# Patient Record
Sex: Female | Born: 1950 | Race: White | Hispanic: No | Marital: Married | Smoking: Current every day smoker
Health system: Southern US, Community
[De-identification: ages and names within clinical notes are randomized; demographics above are authoritative.]

## PROBLEM LIST (undated history)

## (undated) DIAGNOSIS — F32A Depression, unspecified: Secondary | ICD-10-CM

## (undated) DIAGNOSIS — E079 Disorder of thyroid, unspecified: Secondary | ICD-10-CM

## (undated) DIAGNOSIS — I10 Essential (primary) hypertension: Secondary | ICD-10-CM

## (undated) DIAGNOSIS — M1990 Unspecified osteoarthritis, unspecified site: Secondary | ICD-10-CM

## (undated) DIAGNOSIS — N840 Polyp of corpus uteri: Secondary | ICD-10-CM

## (undated) DIAGNOSIS — M8580 Other specified disorders of bone density and structure, unspecified site: Secondary | ICD-10-CM

## (undated) DIAGNOSIS — T7840XA Allergy, unspecified, initial encounter: Secondary | ICD-10-CM

## (undated) HISTORY — PX: TUBAL LIGATION: SHX77

## (undated) HISTORY — PX: TOE SURGERY: SHX1073

## (undated) HISTORY — PX: OTHER SURGICAL HISTORY: SHX169

## (undated) HISTORY — PX: HERNIA REPAIR: SHX51

## (undated) HISTORY — DX: Depression, unspecified: F32.A

## (undated) HISTORY — DX: Unspecified osteoarthritis, unspecified site: M19.90

## (undated) HISTORY — DX: Disorder of thyroid, unspecified: E07.9

## (undated) HISTORY — DX: Essential (primary) hypertension: I10

## (undated) HISTORY — DX: Other specified disorders of bone density and structure, unspecified site: M85.80

## (undated) HISTORY — PX: APPENDECTOMY: SHX54

## (undated) HISTORY — DX: Polyp of corpus uteri: N84.0

## (undated) HISTORY — PX: CARPAL TUNNEL RELEASE: SHX101

## (undated) HISTORY — DX: Allergy, unspecified, initial encounter: T78.40XA

---

## 2000-03-27 ENCOUNTER — Encounter: Admission: RE | Admit: 2000-03-27 | Discharge: 2000-03-27 | Payer: Self-pay | Admitting: Emergency Medicine

## 2000-03-27 ENCOUNTER — Encounter: Payer: Self-pay | Admitting: Emergency Medicine

## 2000-04-09 ENCOUNTER — Ambulatory Visit (HOSPITAL_COMMUNITY): Admission: RE | Admit: 2000-04-09 | Discharge: 2000-04-09 | Payer: Self-pay | Admitting: Urology

## 2000-04-09 ENCOUNTER — Encounter (INDEPENDENT_AMBULATORY_CARE_PROVIDER_SITE_OTHER): Payer: Self-pay | Admitting: Specialist

## 2000-04-29 ENCOUNTER — Ambulatory Visit (HOSPITAL_COMMUNITY): Admission: RE | Admit: 2000-04-29 | Discharge: 2000-04-29 | Payer: Self-pay | Admitting: Urology

## 2000-04-29 ENCOUNTER — Encounter (INDEPENDENT_AMBULATORY_CARE_PROVIDER_SITE_OTHER): Payer: Self-pay | Admitting: Specialist

## 2000-11-10 ENCOUNTER — Other Ambulatory Visit: Admission: RE | Admit: 2000-11-10 | Discharge: 2000-11-10 | Payer: Self-pay | Admitting: Urology

## 2001-03-08 ENCOUNTER — Encounter: Payer: Self-pay | Admitting: Emergency Medicine

## 2001-03-08 ENCOUNTER — Encounter: Admission: RE | Admit: 2001-03-08 | Discharge: 2001-03-08 | Payer: Self-pay | Admitting: Emergency Medicine

## 2001-07-09 ENCOUNTER — Other Ambulatory Visit: Admission: RE | Admit: 2001-07-09 | Discharge: 2001-07-09 | Payer: Self-pay | Admitting: Obstetrics and Gynecology

## 2002-09-06 ENCOUNTER — Encounter: Admission: RE | Admit: 2002-09-06 | Discharge: 2002-09-06 | Payer: Self-pay | Admitting: Emergency Medicine

## 2002-09-06 ENCOUNTER — Encounter: Payer: Self-pay | Admitting: Emergency Medicine

## 2002-10-20 ENCOUNTER — Encounter: Payer: Self-pay | Admitting: Emergency Medicine

## 2002-10-20 ENCOUNTER — Encounter: Admission: RE | Admit: 2002-10-20 | Discharge: 2002-10-20 | Payer: Self-pay | Admitting: Emergency Medicine

## 2003-10-05 ENCOUNTER — Other Ambulatory Visit: Admission: RE | Admit: 2003-10-05 | Discharge: 2003-10-05 | Payer: Self-pay | Admitting: Obstetrics and Gynecology

## 2004-10-15 ENCOUNTER — Other Ambulatory Visit: Admission: RE | Admit: 2004-10-15 | Discharge: 2004-10-15 | Payer: Self-pay | Admitting: Obstetrics and Gynecology

## 2004-11-05 ENCOUNTER — Ambulatory Visit (HOSPITAL_BASED_OUTPATIENT_CLINIC_OR_DEPARTMENT_OTHER): Admission: RE | Admit: 2004-11-05 | Discharge: 2004-11-05 | Payer: Self-pay | Admitting: Obstetrics and Gynecology

## 2004-11-05 ENCOUNTER — Ambulatory Visit (HOSPITAL_COMMUNITY): Admission: RE | Admit: 2004-11-05 | Discharge: 2004-11-05 | Payer: Self-pay | Admitting: Obstetrics and Gynecology

## 2004-11-05 ENCOUNTER — Encounter (INDEPENDENT_AMBULATORY_CARE_PROVIDER_SITE_OTHER): Payer: Self-pay | Admitting: *Deleted

## 2005-01-30 ENCOUNTER — Ambulatory Visit (HOSPITAL_COMMUNITY): Admission: RE | Admit: 2005-01-30 | Discharge: 2005-01-30 | Payer: Self-pay | Admitting: Orthopedic Surgery

## 2005-01-30 ENCOUNTER — Ambulatory Visit (HOSPITAL_BASED_OUTPATIENT_CLINIC_OR_DEPARTMENT_OTHER): Admission: RE | Admit: 2005-01-30 | Discharge: 2005-01-30 | Payer: Self-pay | Admitting: Orthopedic Surgery

## 2007-01-01 ENCOUNTER — Encounter: Admission: RE | Admit: 2007-01-01 | Discharge: 2007-01-01 | Payer: Self-pay | Admitting: Emergency Medicine

## 2008-03-02 ENCOUNTER — Other Ambulatory Visit: Admission: RE | Admit: 2008-03-02 | Discharge: 2008-03-02 | Payer: Self-pay | Admitting: Obstetrics and Gynecology

## 2008-04-13 ENCOUNTER — Encounter: Admission: RE | Admit: 2008-04-13 | Discharge: 2008-04-13 | Payer: Self-pay | Admitting: Emergency Medicine

## 2008-06-12 ENCOUNTER — Ambulatory Visit: Payer: Self-pay | Admitting: Obstetrics and Gynecology

## 2009-05-10 ENCOUNTER — Encounter: Payer: Self-pay | Admitting: Obstetrics and Gynecology

## 2009-05-10 ENCOUNTER — Other Ambulatory Visit: Admission: RE | Admit: 2009-05-10 | Discharge: 2009-05-10 | Payer: Self-pay | Admitting: Obstetrics and Gynecology

## 2009-05-10 ENCOUNTER — Ambulatory Visit: Payer: Self-pay | Admitting: Obstetrics and Gynecology

## 2010-05-14 ENCOUNTER — Ambulatory Visit: Payer: Self-pay | Admitting: Obstetrics and Gynecology

## 2010-05-14 ENCOUNTER — Other Ambulatory Visit: Admission: RE | Admit: 2010-05-14 | Discharge: 2010-05-14 | Payer: Self-pay | Admitting: Obstetrics and Gynecology

## 2010-07-04 ENCOUNTER — Ambulatory Visit: Payer: Self-pay | Admitting: Obstetrics and Gynecology

## 2011-01-24 NOTE — Op Note (Signed)
Sylvia Stevens, Sylvia Stevens                 ACCOUNT NO.:  [id number]   MEDICAL RECORD NO.:  [id number]          PATIENT TYPE:  AMB   LOCATION:  DSC                          FACILITY:  [facility]   PHYSICIAN:  Rodney A. Mortenson, M.D.DATE OF BIRTH:  07/02/1951   DATE OF PROCEDURE:  01/30/2005  DATE OF DISCHARGE:                                 OPERATIVE REPORT   PREOPERATIVE DIAGNOSIS:  Osteoarthritis, right great toe.   POSTOPERATIVE DIAGNOSIS:  Osteoarthritis, right great toe.   OPERATION PERFORMED:  Lorenz Coaster resection proximal portion of proximal phalanx,  right great toe and debridement of osteophytes, right great toe  metacarpophalangeal joint.   SURGEON:  Lenard Galloway. Chaney Malling, M.D.   ANESTHESIA:  MAC.   DESCRIPTION OF PROCEDURE:  Patient placed on the operating table in supine  position.  The right lower extremity was prepped with DuraPrep and draped  out in the usual manner.  The patient had a popliteal block and local  anesthesia was placed in the skin around the great toe.  The foot was then  wrapped out with an Esmarch up into the midcalf area.  A dorsal incision  made over the MP joint carried proximally and distally.  Skin edges were  retracted.  Extensor tendon was released and moved somewhat laterally.  The  dorsal capsule was opened.  There was a huge row of osteophytes over the  metatarsal head dorsally.  There was total loss of articular cartilage over  the weightbearing area of the metatarsal head.  There was severe cartilage  loss at the base of the proximal phalanx and there was bone-on-bone  articulation.  It is felt that a simple removal of osteophytes would be  insufficient.  The proximal end of the great toe was then resected using the  power saw.  All bone debris was removed.  The toe was irrigated with copious  amounts of saline solution.  The soft tissue over the medial side of the  great toe was mobilized and brought down into the joint.  This covered the  metatarsal  head very nicely.  A large K-wire was passed through the toe from  the tip through the proximal phalanx and into the metatarsal head.  This  impaled the tissue flap so there was a nice soft tissue flap between the end  of the metatarsal head and resected proximal phalanx.  Excellent alignment  was achieved.  The capsule was reinforced with small 2-0 Vicryl.  Extensor  tendon was sutured back in place with 2-0 Vicryl and 3-0 nylon was used to  close the skin.  Sterile dressings were applied and the patient returned to  the recovery room in excellent condition. Technically, this procedure went  extremely well.  I am very pleased with the surgical outcome.   DRAINS:  None.   COMPLICATIONS:  None.     RAM/MEDQ  D:  01/30/2005  T:  01/30/2005  Job:  [id number]

## 2011-01-24 NOTE — Op Note (Signed)
[facility]  Patient:    Sylvia Stevens, Sylvia Stevens                        MRN: [id number] Proc. Date: 04/29/00 Adm. Date:  [id number] Attending:  Katherine Roan CC:         Dr. Lorenz Coaster   Operative Report  PREOPERATIVE DIAGNOSES:  Hematuria in a smoker, inflammatory bladder lesions.  POSTOPERATIVE DIAGNOSES:  Hematuria in a smoker, inflammatory bladder lesions.  OPERATIONS:  Cystoscopy bladder biopsy x 3, (anterior midline x 2), (posterior midline x 1).  ANESTHESIA:  IV sedation plus local.  SURGEON:  Rozanna Boer., M.D.  BRIEF HISTORY:  This 60 year old white female smoker comes in with hematuria for evaluation.  She has had recurrent urinary tract infections documented October 2000, January 2001, and July 2001.  She has no irritative symptoms. Pelvic and abdominal CT were negative in July.  She enters because on cystoscopy she had a lesion in the posterior right side in the midline of a sessile lesion that needs a biopsy.  She had had previous anesthetic reactions before and did not wish to go to sleep or have a spinal, so IV sedation was offered, and she accepted.  DESCRIPTION OF PROCEDURE:  The patient was placed on the operating table in a supine position and was prepped and draped with Betadine and given some mild IV sedation.  The #21 panendoscope was inserted, and the bladder was carefully inspected using the right-angled ______  lens.  The only area that was at all abnormal was in the anterior wall just to the left of the midline and in the midline posterior at the base was another inflammatory area.  These were both biopsied, two on the anterior wall and one on the posterior, and then the base was fulgurated with the Bugbee.  The patient tolerated this quite well. Hemostasis was good.  The bladder was drained, and no catheter was left.  The patient was then taken to the recovery room in good condition and was later discharged  as an outpatient with detailed written instructions.  She will be called regarding the biopsy report when available. DD:  04/29/00 TD:  04/29/00 Job: [id number]

## 2011-01-24 NOTE — Op Note (Signed)
NAMESHAMYRA, Sylvia Stevens                 ACCOUNT NO.:  [id number]   MEDICAL RECORD NO.:  [id number]          PATIENT TYPE:  AMB   LOCATION:  [facility]                         FACILITY:  [facility]   PHYSICIAN:  Daniel L. Gottsegen, M.D.DATE OF BIRTH:  [birth year]   DATE OF PROCEDURE:  11/05/2004  DATE OF DISCHARGE:                                 OPERATIVE REPORT   PREOPERATIVE DIAGNOSIS:  Post menopausal bleeding, probable endometrial  polyp.   POSTOPERATIVE DIAGNOSIS:  Post menopausal bleeding, endometrial polyp.   NAME OF OPERATION:  Hysteroscopy D&C.   INDICATIONS:  The patient is a 60 year old gravida 2, para 2 who has  presented to the office with a history of post menopausal bleeding.  Endometrial biopsy was performed in the office. Endometrial curettings were  benign, but there was also fragments of an endometrial polyp present. After  this, the patient continued bleeding, and it was felt that she probably had  not had the entire polyp excised at the time of endometrial biopsy. She  therefore enters the hospital now for hysteroscopy D&C.   FINDINGS:  External vaginal exam is normal. Cervix is clean. Uterus is  normal size and shape. Adnexa are not palpably enlarged. At the time of  hysteroscopy, the patient has fragments of endometrial polyp in her right  cornual area. There were two or three fragments, and it appeared that the  entire endometrial polyp had not been previously excised at the time of  endometrial biopsy. Once this was removed, top of the fundus, anterior and  posterior walls of the fundus, lower uterine segment, and the cervical canal  were all free of disease.   PROCEDURE:  After adequate general anesthesia, the patient was placed in the  dorsal lithotomy position, prepped and draped in the usual sterile manner. A  single toothed tenaculum was placed in the anterior lip of the cervix. The  cervix was dilated to a #33 Pratt dilator. A hysteroscopic examination was  done  with a hysteroscopic resectoscope. Three percent Sorbitol was used to  expand the intrauterine cavity. A camera was used for magnification. A 90-  degree wire loop with settings of 70 coag/100 cutting was utilized. The  fragments of polyps described above were seen, and they were resected with  the wire loop. They were sent to pathology for tissue diagnosis. The rest of  the endometrial appeared atrophic. There was no disease involving either the  endometrium or the cervix other than described above. At the time of the  procedure, there was no bleeding noted. Fluid deficit was minimal. Tissue  was sent to pathology for tissue diagnosis. The patient left the operating  room in satisfactory condition.      DLG/MEDQ  D:  11/05/2004  T:  11/05/2004  Job:  [id number]

## 2011-06-10 ENCOUNTER — Encounter: Payer: Self-pay | Admitting: Obstetrics and Gynecology

## 2011-06-12 ENCOUNTER — Other Ambulatory Visit: Payer: Self-pay | Admitting: Obstetrics and Gynecology

## 2011-06-12 ENCOUNTER — Other Ambulatory Visit: Payer: Self-pay | Admitting: *Deleted

## 2011-06-12 DIAGNOSIS — N63 Unspecified lump in unspecified breast: Secondary | ICD-10-CM

## 2011-07-10 ENCOUNTER — Encounter: Payer: Self-pay | Admitting: Gynecology

## 2011-07-10 DIAGNOSIS — N840 Polyp of corpus uteri: Secondary | ICD-10-CM | POA: Insufficient documentation

## 2011-07-10 DIAGNOSIS — M8580 Other specified disorders of bone density and structure, unspecified site: Secondary | ICD-10-CM | POA: Insufficient documentation

## 2011-07-10 DIAGNOSIS — M1990 Unspecified osteoarthritis, unspecified site: Secondary | ICD-10-CM | POA: Insufficient documentation

## 2011-07-28 ENCOUNTER — Other Ambulatory Visit (HOSPITAL_COMMUNITY)
Admission: RE | Admit: 2011-07-28 | Discharge: 2011-07-28 | Disposition: A | Discharge status: Home / Self Care | Payer: BC Managed Care – PPO | Source: Ambulatory Visit | Attending: Obstetrics and Gynecology | Admitting: Obstetrics and Gynecology

## 2011-07-28 ENCOUNTER — Encounter: Payer: Self-pay | Admitting: Obstetrics and Gynecology

## 2011-07-28 ENCOUNTER — Ambulatory Visit (INDEPENDENT_AMBULATORY_CARE_PROVIDER_SITE_OTHER): Payer: BC Managed Care – PPO | Admitting: Obstetrics and Gynecology

## 2011-07-28 VITALS — BP 136/72 | Ht 62.5 in | Wt 150.0 lb

## 2011-07-28 DIAGNOSIS — Z01419 Encounter for gynecological examination (general) (routine) without abnormal findings: Secondary | ICD-10-CM | POA: Insufficient documentation

## 2011-07-28 DIAGNOSIS — I10 Essential (primary) hypertension: Secondary | ICD-10-CM | POA: Insufficient documentation

## 2011-07-28 DIAGNOSIS — M8580 Other specified disorders of bone density and structure, unspecified site: Secondary | ICD-10-CM

## 2011-07-28 DIAGNOSIS — M949 Disorder of cartilage, unspecified: Secondary | ICD-10-CM

## 2011-07-28 MED ORDER — VITAMIN D (ERGOCALCIFEROL) 1.25 MG (50000 UNIT) PO CAPS: 50000.0000 [IU] | ORAL_CAPSULE | ORAL | Status: DC

## 2011-07-28 NOTE — Progress Notes (Signed)
Patient came back to see me today for an annual GYN exam. She is doing well menopausally without medication. She is up-to-date on mammograms. She is low bone mass on bone density. She is a vitamin D deficiency but has responded well to every other week vitamin D. She is having no vaginal bleeding. She is having no pelvic pain. She's been diagnosed with hypertension and is doing well on medication with a normal blood pressure. She told  me today that her renal function was slightly elevated and is being monitored. So is her cholesterol.  ROS: 12 system review done. Pertinent positives listed above.  Physical examination: Kennon Portela present. HEENT within normal limits. Neck: Thyroid not large. No masses. Supraclavicular nodes: not enlarged. Breasts: Examined in both sitting midline position. No skin changes and no masses. Abdomen: Soft no guarding rebound or masses or hernia. Pelvic: External: Within normal limits. BUS: Within normal limits. Vaginal:within normal limits. Good estrogen effect. No evidence of cystocele rectocele or enterocele. Cervix: clean. Uterus: Normal size and shape. Adnexa: No masses. Rectovaginal exam: Confirmatory and negative. Extremities: Within normal limits.  Assessment: Osteopenia. Vitamin D deficiency.  Plan: Continue 50,000 IUs of vitamin D every other week. Followup bone density in  2013. Continue yearly mammograms.

## 2011-09-23 ENCOUNTER — Ambulatory Visit (INDEPENDENT_AMBULATORY_CARE_PROVIDER_SITE_OTHER): Payer: BC Managed Care – PPO | Admitting: Family Medicine

## 2011-09-23 ENCOUNTER — Encounter: Payer: Self-pay | Admitting: Family Medicine

## 2011-09-23 DIAGNOSIS — I10 Essential (primary) hypertension: Secondary | ICD-10-CM

## 2011-09-23 DIAGNOSIS — M899 Disorder of bone, unspecified: Secondary | ICD-10-CM

## 2011-09-23 DIAGNOSIS — E559 Vitamin D deficiency, unspecified: Secondary | ICD-10-CM

## 2011-09-23 DIAGNOSIS — M8580 Other specified disorders of bone density and structure, unspecified site: Secondary | ICD-10-CM

## 2011-09-23 DIAGNOSIS — E039 Hypothyroidism, unspecified: Secondary | ICD-10-CM

## 2011-09-23 DIAGNOSIS — F17200 Nicotine dependence, unspecified, uncomplicated: Secondary | ICD-10-CM

## 2011-09-23 LAB — BASIC METABOLIC PANEL
CO2: 28 mEq/L (ref 19–32)
Chloride: 107 mEq/L (ref 96–112)
Creatinine, Ser: 1 mg/dL (ref 0.4–1.2)
Glucose, Bld: 95 mg/dL (ref 70–99)
Sodium: 145 mEq/L (ref 135–145)

## 2011-09-23 LAB — LIPID PANEL: Cholesterol: 205 mg/dL — ABNORMAL HIGH (ref 0–200)

## 2011-09-23 LAB — CBC WITH DIFFERENTIAL/PLATELET
Basophils Absolute: 0 10*3/uL (ref 0.0–0.1)
Eosinophils Relative: 1.5 % (ref 0.0–5.0)
Hemoglobin: 13.9 g/dL (ref 12.0–15.0)
Lymphocytes Relative: 21.3 % (ref 12.0–46.0)
Monocytes Relative: 5.2 % (ref 3.0–12.0)
Neutro Abs: 6 10*3/uL (ref 1.4–7.7)
RBC: 4.27 Mil/uL (ref 3.87–5.11)
RDW: 14 % (ref 11.5–14.6)
WBC: 8.5 10*3/uL (ref 4.5–10.5)

## 2011-09-23 LAB — HEPATIC FUNCTION PANEL
ALT: 16 U/L (ref 0–35)
AST: 21 U/L (ref 0–37)
Total Protein: 7.2 g/dL (ref 6.0–8.3)

## 2011-09-23 LAB — TSH: TSH: 0.51 u[IU]/mL (ref 0.35–5.50)

## 2011-09-23 MED ORDER — LEVOTHYROXINE SODIUM 100 MCG PO TABS: 100.0000 ug | ORAL_TABLET | Freq: Every day | ORAL | Status: DC

## 2011-09-23 MED ORDER — LISINOPRIL 10 MG PO TABS: 10.0000 mg | ORAL_TABLET | Freq: Every day | ORAL | Status: DC

## 2011-09-23 NOTE — Patient Instructions (Signed)
Schedule your complete physical for 6 months We'll notify you of your lab results and make any changes if needed Consider quitting smoking You look great!  Keep up the good work! Call with any questions or concerns Think of [place] as your home base Welcome!  We're glad to have you!

## 2011-09-23 NOTE — Assessment & Plan Note (Signed)
Pt taking 50,000 units every other week due to osteopenia.

## 2011-09-23 NOTE — Assessment & Plan Note (Signed)
Chronic problem.  Not interested in quitting despite discussion about negative health effects of cigarette smoking.  Will continue to follow.

## 2011-09-23 NOTE — Assessment & Plan Note (Signed)
Chronic problem.  Has never had meds adjusted.  Due for labs.

## 2011-09-23 NOTE — Assessment & Plan Note (Signed)
Chronic problem.  Fair control on current meds.  Asymptomatic.  No changes.

## 2011-09-23 NOTE — Progress Notes (Signed)
  Subjective:    Patient ID: Sylvia Stevens, female    DOB: 1951/03/23, 61 y.o.   MRN: [id number]  HPI New to establish.  Previous MD- Dr Lorenz Coaster and then Dr Patsy Lager at [facility].  GYN- Gottseigen, UTD on pap and mammo ([facility]).  GI- [facility], UTD on colonoscopy.  HTN- new within the last year.  On Lisinopril daily.  No CP, SOB, HAs, visual changes, edema.  Due for labs.  Still smoking.  Hypothyroid- chronic problem, on synthroid.  Due for labs.  Has never had synthroid dose adjusted.  Vit D deficiency- is currently taking 50,000 units every other week per Dr Carrolyn Leigh b/c of osteopenia on DEXA.  Tobacco Use- 1/2 ppd, smoker x28 yrs.  Not interested in quitting at this time.  'i have a very high stress job and this is my way to cope'.  Works as Public relations account executive.   Review of Systems For ROS see HPI     Objective:   Physical Exam  Vitals reviewed. Constitutional: She is oriented to person, place, and time. She appears well-developed and well-nourished. No distress.       Smells strongly of tobacco  HENT:  Head: Normocephalic and atraumatic.  Eyes: Conjunctivae and EOM are normal. Pupils are equal, round, and reactive to light.  Neck: Normal range of motion. Neck supple. No thyromegaly present.  Cardiovascular: Normal rate, regular rhythm, normal heart sounds and intact distal pulses.   No murmur heard. Pulmonary/Chest: Effort normal and breath sounds normal. No respiratory distress.  Abdominal: Soft. She exhibits no distension. There is no tenderness.  Musculoskeletal: She exhibits no edema.  Lymphadenopathy:    She has no cervical adenopathy.  Neurological: She is alert and oriented to person, place, and time.  Skin: Skin is warm and dry.  Psychiatric: She has a normal mood and affect. Her behavior is normal.          Assessment & Plan:

## 2011-09-23 NOTE — Assessment & Plan Note (Signed)
Has regular DEXA scans w/ Dr Carrolyn Leigh.  On Vit D.  Will follow.

## 2011-09-24 ENCOUNTER — Encounter: Payer: Self-pay | Admitting: Family Medicine

## 2011-09-24 NOTE — Progress Notes (Signed)
Quick Note:  Pt aware ______ 

## 2011-09-26 ENCOUNTER — Encounter: Payer: Self-pay | Admitting: Family Medicine

## 2011-09-26 ENCOUNTER — Ambulatory Visit (INDEPENDENT_AMBULATORY_CARE_PROVIDER_SITE_OTHER): Payer: BC Managed Care – PPO | Admitting: Family Medicine

## 2011-09-26 VITALS — BP 125/90 | HR 82 | Temp 99.0°F | Ht 63.5 in | Wt 150.6 lb

## 2011-09-26 DIAGNOSIS — K644 Residual hemorrhoidal skin tags: Secondary | ICD-10-CM

## 2011-09-26 MED ORDER — HYDROCORTISONE ACE-PRAMOXINE 2.5-1 % RE CREA: TOPICAL_CREAM | Freq: Three times a day (TID) | RECTAL | Status: AC

## 2011-09-26 NOTE — Progress Notes (Signed)
  Subjective:    Patient ID: Sylvia Stevens, female    DOB: [birth year], 61 y.o.   MRN: [id number]  HPI BRBPR- had BM yesterday morning, normal consistency.  Did not strain.  Had BRB on the tissue and then noticed it in underwear later in the day and is still having a trickle of blood requiring her to wear a pad.  Bleeding is painless but does have some rectal 'burning'.  Had colonoscopy 5 yrs ago and had 'handful of pockets'   Review of Systems For ROS see HPI     Objective:   Physical Exam  Vitals reviewed. Constitutional: She appears well-developed and well-nourished. No distress.  Genitourinary: Rectal exam shows external hemorrhoid (w/ small site of active bleeding).          Assessment & Plan:

## 2011-09-26 NOTE — Patient Instructions (Signed)
This is a bleeding hemorrhoid Use the Analpram twice daily to heal the area If you decide you want/need to see GI- call or email me Sherri Rad in there!

## 2011-09-26 NOTE — Assessment & Plan Note (Signed)
Pt's rectal bleeding is coming from small external hemorhoid.  Start Analpram to heal area.  Offered pt GI referral.  Prefers to hold off at this time.  Will contact us if she changes her mind.

## 2011-11-28 ENCOUNTER — Encounter: Payer: Self-pay | Admitting: Family Medicine

## 2011-11-28 ENCOUNTER — Ambulatory Visit (INDEPENDENT_AMBULATORY_CARE_PROVIDER_SITE_OTHER): Payer: BC Managed Care – PPO | Admitting: Family Medicine

## 2011-11-28 DIAGNOSIS — M25519 Pain in unspecified shoulder: Secondary | ICD-10-CM

## 2011-11-28 DIAGNOSIS — R079 Chest pain, unspecified: Secondary | ICD-10-CM

## 2011-11-28 MED ORDER — TRAMADOL HCL 50 MG PO TABS: 50.0000 mg | ORAL_TABLET | Freq: Three times a day (TID) | ORAL | Status: AC | PRN

## 2011-11-28 MED ORDER — CYCLOBENZAPRINE HCL 10 MG PO TABS: 10.0000 mg | ORAL_TABLET | Freq: Three times a day (TID) | ORAL | Status: AC | PRN

## 2011-11-28 NOTE — Progress Notes (Signed)
  Subjective:    Sylvia Stevens is a 61 y.o. female who presents with left shoulder pain. The symptoms began several days ago. Aggravating factors: no known event. Pain is located diffusely throughout the shoulder. Discomfort is described as aching, numbness and tingling. Symptoms are exacerbated by overhead movements. Evaluation to date: none. Therapy to date includes: prescription NSAIDS which are ineffective.  The following portions of the patient's history were reviewed and updated as appropriate: allergies, current medications, past family history, past medical history, past social history, past surgical history and problem list.  Review of Systems Pertinent items are noted in HPI.   Objective:    BP 118/70  Pulse 59  Temp(Src) 98.3 F (36.8 C) (Oral)  Wt 147 lb 3.2 oz (66.769 kg)  SpO2 96% Right shoulder: normal active ROM, no tenderness, no impingement sign  Left shoulder: non-specific diffuse tenderness about the shoulder, full ROM and numbness and tingling in L hand     Assessment:    Left shoulder pain    Plan:  ekg done secondary to pain completely going away at one point and then coming back---nsr  Educational material distributed. Reduction in offending activity. Rest, ice, compression, and elevation (RICE) therapy. OTC analgesics as needed. muscle relaxer and tramadol prescribed  Will refer to sport med if no relief

## 2011-11-28 NOTE — Patient Instructions (Signed)
Shoulder Pain The shoulder is a ball and socket joint. The muscles and tendons (rotator cuff) are what keep the shoulder in its joint and stable. This collection of muscles and tendons holds in the head (ball) of the humerus (upper arm bone) in the fossa (cup) of the scapula (shoulder blade). Today no reason was found for your shoulder pain. Often pain in the shoulder may be treated conservatively with temporary immobilization. For example, holding the shoulder in one place using a sling for rest. Physical therapy may be needed if problems continue. HOME CARE INSTRUCTIONS   Apply ice to the sore area for 15 to 20 minutes, 3 to 4 times per day for the first 2 days. Put the ice in a plastic bag. Place a towel between the bag of ice and your skin.   If you have or were given a shoulder sling and straps, do not remove for as long as directed by your caregiver or until you see a caregiver for a follow-up examination. If you need to remove it to shower or bathe, move your arm as little as possible.   Sleep on several pillows at night to lessen swelling and pain.   Only take over-the-counter or prescription medicines for pain, discomfort, or fever as directed by your caregiver.   Keep any follow-up appointments in order to avoid any type of permanent shoulder disability or chronic pain problems.  SEEK MEDICAL CARE IF:   Pain in your shoulder increases or new pain develops in your arm, hand, or fingers.   Your hand or fingers are colder than your other hand.   You do not obtain pain relief with the medications or your pain becomes worse.  SEEK IMMEDIATE MEDICAL CARE IF:   Your arm, hand, or fingers are numb or tingling.   Your arm, hand, or fingers are swollen, painful, or turn white or blue.   You develop chest pain or shortness of breath.  MAKE SURE YOU:   Understand these instructions.   Will watch your condition.   Will get help right away if you are not doing well or get worse.    Document Released: 06/04/2005 Document Revised: 08/14/2011 Document Reviewed: 08/09/2011 Exit[employer] Patient Information 2012 [employer]. 

## 2012-01-08 ENCOUNTER — Ambulatory Visit (INDEPENDENT_AMBULATORY_CARE_PROVIDER_SITE_OTHER): Payer: BC Managed Care – PPO | Admitting: Family Medicine

## 2012-01-08 ENCOUNTER — Encounter: Payer: Self-pay | Admitting: Family Medicine

## 2012-01-08 VITALS — BP 132/72 | HR 81 | Temp 99.1°F | Ht 62.5 in | Wt 148.0 lb

## 2012-01-08 DIAGNOSIS — M7070 Other bursitis of hip, unspecified hip: Secondary | ICD-10-CM

## 2012-01-08 DIAGNOSIS — M25529 Pain in unspecified elbow: Secondary | ICD-10-CM | POA: Insufficient documentation

## 2012-01-08 DIAGNOSIS — M76899 Other specified enthesopathies of unspecified lower limb, excluding foot: Secondary | ICD-10-CM

## 2012-01-08 MED ORDER — NAPROXEN 500 MG PO TABS: 500.0000 mg | ORAL_TABLET | Freq: Two times a day (BID) | ORAL | Status: AC

## 2012-01-08 NOTE — Patient Instructions (Signed)
The Naproxen will improve the bursitis (take w/ food) We'll call you with your ortho appt HEAT! Call with any questions or concerns Hang in there!!!

## 2012-01-08 NOTE — Assessment & Plan Note (Signed)
New.  Unclear as to relation between pt's elbow pain and shoulder pain.  May be nerve mediated from neck/shoulder.  Start NSAIDs.  Refer to ortho.  Reviewed supportive care and red flags that should prompt return.  Pt expressed understanding and is in agreement w/ plan.

## 2012-01-08 NOTE — Progress Notes (Signed)
  Subjective:    Patient ID: Sylvia Stevens, female    DOB: August 07, 1951, 61 y.o.   MRN: [id number]  HPI Arm pain- L arm, has full ROM at shoulder w/ arm extended but w/ elbow bent, can only raise arm to level of shoulder.  If puts pressure on L elbow, sends shooting pain up into upper arm.  sxs x6 weeks.  advil will take pain 'down to a dull throb'.  No relief w/ flexeril, vomited after taking tramadol.  Has never seen ortho for this, previously saw [facility]  Hip pain- 1 month ago bent over to pick up something and 'something popped'.  R hip.  No pain w/ sitting or lying, pain w/ weight bearing over lateral hip.  No bowel or bladder incontinence.   Review of Systems For ROS see HPI     Objective:   Physical Exam  Vitals reviewed. Constitutional: She appears well-developed and well-nourished. No distress.  Musculoskeletal:       Left shoulder: She exhibits decreased range of motion (only w/ elbow bent- full ROM w/ elbow extended). She exhibits no tenderness, no bony tenderness, no swelling and no deformity.       Left elbow: She exhibits normal range of motion, no swelling and no effusion. tenderness found.       Right hip: She exhibits tenderness (over greater trochanteric bursa). She exhibits normal range of motion and normal strength.          Assessment & Plan:

## 2012-01-08 NOTE — Assessment & Plan Note (Signed)
New.  Pt's sxs and PE consistent w/ inflammation.  Start scheduled NSAIDs, heat.  Reviewed supportive care and red flags that should prompt return.  Pt expressed understanding and is in agreement w/ plan.

## 2012-03-04 ENCOUNTER — Encounter: Payer: Self-pay | Admitting: Family Medicine

## 2012-03-22 ENCOUNTER — Ambulatory Visit (INDEPENDENT_AMBULATORY_CARE_PROVIDER_SITE_OTHER): Payer: BC Managed Care – PPO | Admitting: Family Medicine

## 2012-03-22 ENCOUNTER — Encounter: Payer: Self-pay | Admitting: Family Medicine

## 2012-03-22 VITALS — BP 125/82 | HR 75 | Temp 99.2°F | Ht 62.75 in | Wt 148.0 lb

## 2012-03-22 DIAGNOSIS — E559 Vitamin D deficiency, unspecified: Secondary | ICD-10-CM

## 2012-03-22 DIAGNOSIS — Z0000 Encounter for general adult medical examination without abnormal findings: Secondary | ICD-10-CM | POA: Insufficient documentation

## 2012-03-22 DIAGNOSIS — E039 Hypothyroidism, unspecified: Secondary | ICD-10-CM

## 2012-03-22 DIAGNOSIS — I10 Essential (primary) hypertension: Secondary | ICD-10-CM

## 2012-03-22 LAB — HEPATIC FUNCTION PANEL
ALT: 19 U/L (ref 0–35)
AST: 22 U/L (ref 0–37)
Bilirubin, Direct: 0 mg/dL (ref 0.0–0.3)
Total Protein: 6.7 g/dL (ref 6.0–8.3)

## 2012-03-22 LAB — LIPID PANEL
Cholesterol: 210 mg/dL — ABNORMAL HIGH (ref 0–200)
Total CHOL/HDL Ratio: 4

## 2012-03-22 LAB — BASIC METABOLIC PANEL
BUN: 15 mg/dL (ref 6–23)
CO2: 29 mEq/L (ref 19–32)
Chloride: 108 mEq/L (ref 96–112)
Creatinine, Ser: 1.1 mg/dL (ref 0.4–1.2)
Potassium: 4.9 mEq/L (ref 3.5–5.1)

## 2012-03-22 LAB — CBC WITH DIFFERENTIAL/PLATELET
Basophils Absolute: 0 10*3/uL (ref 0.0–0.1)
Basophils Relative: 0.5 % (ref 0.0–3.0)
Hemoglobin: 13.1 g/dL (ref 12.0–15.0)
Lymphocytes Relative: 23.1 % (ref 12.0–46.0)
Monocytes Relative: 6.5 % (ref 3.0–12.0)
Neutro Abs: 4.2 10*3/uL (ref 1.4–7.7)
RBC: 4.12 Mil/uL (ref 3.87–5.11)

## 2012-03-22 NOTE — Assessment & Plan Note (Signed)
Chronic problem.  Check labs.  Adjust meds prn  

## 2012-03-22 NOTE — Assessment & Plan Note (Signed)
Pt's PE WNL.  UTD on GYN and colonoscopy.  Check labs.  Anticipatory guidance provided.  

## 2012-03-22 NOTE — Patient Instructions (Addendum)
Follow up in 6 months to recheck BP We'll notify you of your lab results and make any changes if needed Start Fungi-Nail on the L big toe Stop smoking!  You can do it! Call with any questions or concerns Enjoy the rest of your summer!

## 2012-03-22 NOTE — Progress Notes (Signed)
  Subjective:    Patient ID: Sylvia Stevens, female    DOB: 1950/09/14, 61 y.o.   MRN: [id number]  HPI CPE- UTD on GYN, colonoscopy ([facility]).  No concerns today.   Review of Systems Patient reports no vision/ hearing changes, adenopathy,fever, weight change,  persistant/recurrent hoarseness , swallowing issues, chest pain, palpitations, edema, persistant/recurrent cough, hemoptysis, dyspnea (rest/exertional/paroxysmal nocturnal), gastrointestinal bleeding (melena, rectal bleeding), abdominal pain, significant heartburn, bowel changes, GU symptoms (dysuria, hematuria, incontinence), Gyn symptoms (abnormal  bleeding, pain),  syncope, focal weakness, memory loss, numbness & tingling, skin/hair/nail changes, abnormal bruising or bleeding, anxiety, or depression.     Objective:   Physical Exam General Appearance:    Alert, cooperative, no distress, appears stated age  Head:    Normocephalic, without obvious abnormality, atraumatic  Eyes:    PERRL, conjunctiva/corneas clear, EOM's intact, fundi    benign, both eyes  Ears:    Normal TM's and external ear canals, both ears  Nose:   Nares normal, septum midline, mucosa normal, no drainage    or sinus tenderness  Throat:   Lips, mucosa, and tongue normal; teeth and gums normal  Neck:   Supple, symmetrical, trachea midline, no adenopathy;    Thyroid: no enlargement/tenderness/nodules  Back:     Symmetric, no curvature, ROM normal, no CVA tenderness  Lungs:     Clear to auscultation bilaterally, respirations unlabored  Chest Wall:    No tenderness or deformity   Heart:    Regular rate and rhythm, S1 and S2 normal, no murmur, rub   or gallop  Breast Exam:    Deferred to GYN  Abdomen:     Soft, non-tender, bowel sounds active all four quadrants,    no masses, no organomegaly  Genitalia:    Deferred to GYN  Rectal:    Extremities:   Extremities normal, atraumatic, no cyanosis or edema  Pulses:   2+ and symmetric all extremities  Skin:   Skin color,  texture, turgor normal, no rashes or lesions  Lymph nodes:   Cervical, supraclavicular, and axillary nodes normal  Neurologic:   CNII-XII intact, normal strength, sensation and reflexes    throughout          Assessment & Plan:

## 2012-03-22 NOTE — Assessment & Plan Note (Signed)
Check labs.  Replete prn. 

## 2012-03-22 NOTE — Assessment & Plan Note (Signed)
Chronic problem.  Well controlled.  Asymptomatic.  No changes. 

## 2012-03-23 ENCOUNTER — Encounter: Payer: Self-pay | Admitting: *Deleted

## 2012-03-26 LAB — VITAMIN D 1,25 DIHYDROXY
Vitamin D2 1, 25 (OH)2: 37 pg/mL
Vitamin D3 1, 25 (OH)2: 14 pg/mL

## 2012-04-16 ENCOUNTER — Telehealth: Payer: Self-pay | Admitting: Family Medicine

## 2012-04-16 MED ORDER — LISINOPRIL 10 MG PO TABS: 10.0000 mg | ORAL_TABLET | Freq: Every day | ORAL | Status: DC

## 2012-04-16 NOTE — Telephone Encounter (Signed)
rx sent to pharmacy by e-script  

## 2012-04-16 NOTE — Telephone Encounter (Signed)
Refill: Lisinopril 10mg  tablets. Take 1 tablet by mouth every day. Qty 30. Last fill 03-19-12

## 2012-04-23 ENCOUNTER — Telehealth: Payer: Self-pay | Admitting: Family Medicine

## 2012-04-23 MED ORDER — LEVOTHYROXINE SODIUM 100 MCG PO TABS: 100.0000 ug | ORAL_TABLET | Freq: Every day | ORAL | Status: DC

## 2012-04-23 NOTE — Telephone Encounter (Signed)
Refill: Levothyroxine 0.100mg  tab. Take one tablet by mouth daily. Qty 30. Last fill 03-25-12

## 2012-06-10 ENCOUNTER — Encounter: Payer: Self-pay | Admitting: Obstetrics and Gynecology

## 2012-07-30 ENCOUNTER — Ambulatory Visit (INDEPENDENT_AMBULATORY_CARE_PROVIDER_SITE_OTHER): Payer: BC Managed Care – PPO | Admitting: Obstetrics and Gynecology

## 2012-07-30 ENCOUNTER — Encounter: Payer: Self-pay | Admitting: Obstetrics and Gynecology

## 2012-07-30 VITALS — BP 130/76 | Ht 62.0 in | Wt 150.0 lb

## 2012-07-30 DIAGNOSIS — Z01419 Encounter for gynecological examination (general) (routine) without abnormal findings: Secondary | ICD-10-CM

## 2012-07-30 NOTE — Progress Notes (Signed)
Patient came to see me today for her annual GYN exam. She is having no vaginal bleeding. She is having no pelvic pain. She and her husband are not sexually active. She is doing well without hormone replacement therapy. She is up-to-date on mammograms and colonoscopy. She has always had normal pap smears. Her last Pap smear was 2012. She does lab through her PCP. In 2011 she had a bone density here that showed continued low bone mass without an elevated fracture risk. There was improvement in one hip and loss of bone and the other hip. She has had no fractures.  Physical examination:Sylvia Stevens present. HEENT within normal limits. Neck: Thyroid not large. No masses. Supraclavicular nodes: not enlarged. Breasts: Examined in both sitting and lying  position. No skin changes and no masses. Abdomen: Soft no guarding rebound or masses or hernia. Pelvic: External: Within normal limits. BUS: Within normal limits. Vaginal:within normal limits. Good estrogen effect. No evidence of cystocele rectocele or enterocele. Cervix: clean. Uterus: Normal size and shape. Adnexa: No masses. Rectovaginal exam: Confirmatory and negative. Extremities: Within normal limits.  Assessment: Normal GYN exam. Osteopenia.  Plan: Continue yearly mammograms. Bone density. Pap not done.The new Pap smear guidelines were discussed with the patient.

## 2012-07-30 NOTE — Patient Instructions (Signed)
Schedule bone density.    

## 2012-07-31 LAB — URINALYSIS W MICROSCOPIC + REFLEX CULTURE
Bacteria, UA: NONE SEEN
Bilirubin Urine: NEGATIVE
Glucose, UA: NEGATIVE mg/dL
Hgb urine dipstick: NEGATIVE
Protein, ur: NEGATIVE mg/dL
Squamous Epithelial / LPF: NONE SEEN
Urobilinogen, UA: 0.2 mg/dL (ref 0.0–1.0)

## 2012-08-10 ENCOUNTER — Encounter: Payer: Self-pay | Admitting: Obstetrics and Gynecology

## 2012-08-18 ENCOUNTER — Encounter: Payer: Self-pay | Admitting: Family Medicine

## 2012-08-18 ENCOUNTER — Ambulatory Visit (INDEPENDENT_AMBULATORY_CARE_PROVIDER_SITE_OTHER): Payer: BC Managed Care – PPO | Admitting: Family Medicine

## 2012-08-18 VITALS — BP 130/78 | HR 70 | Temp 98.5°F | Ht 62.75 in | Wt 149.6 lb

## 2012-08-18 DIAGNOSIS — R399 Unspecified symptoms and signs involving the genitourinary system: Secondary | ICD-10-CM

## 2012-08-18 DIAGNOSIS — E039 Hypothyroidism, unspecified: Secondary | ICD-10-CM

## 2012-08-18 DIAGNOSIS — L309 Dermatitis, unspecified: Secondary | ICD-10-CM | POA: Insufficient documentation

## 2012-08-18 DIAGNOSIS — R3989 Other symptoms and signs involving the genitourinary system: Secondary | ICD-10-CM

## 2012-08-18 DIAGNOSIS — L259 Unspecified contact dermatitis, unspecified cause: Secondary | ICD-10-CM

## 2012-08-18 LAB — POCT URINALYSIS DIPSTICK
Glucose, UA: NEGATIVE
Nitrite, UA: POSITIVE
Urobilinogen, UA: 0.2

## 2012-08-18 LAB — TSH: TSH: 0.26 u[IU]/mL — ABNORMAL LOW (ref 0.35–5.50)

## 2012-08-18 LAB — T4, FREE: Free T4: 1.38 ng/dL (ref 0.60–1.60)

## 2012-08-18 LAB — T3, FREE: T3, Free: 3 pg/mL (ref 2.3–4.2)

## 2012-08-18 MED ORDER — CIPROFLOXACIN HCL 500 MG PO TABS: 500.0000 mg | ORAL_TABLET | Freq: Two times a day (BID) | ORAL | Status: DC

## 2012-08-18 NOTE — Assessment & Plan Note (Signed)
New to provider, ongoing for pt.  Apply topical hydrocortisone cream.  Pt to call if no improvement.

## 2012-08-18 NOTE — Patient Instructions (Addendum)
Start the Cipro for the UTI- twice daily w/ food Drink plenty of fluids Apply hydrocortisone cream to area on neck We'll notify you of your lab results and make any changes to the thyroid med if needed Call with any questions or concerns Hang in there! Happy Holidays!!

## 2012-08-18 NOTE — Progress Notes (Signed)
  Subjective:    Patient ID: Sylvia Stevens, female    DOB: October 26, 1950, 61 y.o.   MRN: [id number]  HPI UTI- sxs started Monday afternoon w/ dysuria.  Developed hematuria yesterday.  Started AZO yesterday.  Increased frequency, urgency, hesitancy.  + suprapubic pain.  No back pain.  No fevers.  Scalp dermatitis- pt reports that preceding her dx of hypothyroid she had patch at the base on neck.  When she started her meds, patch resolved.  Patch returned when she ran out of meds for a few weeks.  Pt returned 3 weeks ago accompanied by fatigue.  Pt concerned thyroid is again abnormal.   Review of Systems For ROS see HPI     Objective:   Physical Exam  Vitals reviewed. Constitutional: She appears well-developed and well-nourished. No distress.  Neck: Normal range of motion. Neck supple. No thyromegaly present.  Cardiovascular: Normal rate, regular rhythm, normal heart sounds and intact distal pulses.   Pulmonary/Chest: Effort normal and breath sounds normal. No respiratory distress. She has no wheezes. She has no rales.  Abdominal: Soft. She exhibits no distension. There is no tenderness (no suprapubic or CVA tenderness).  Lymphadenopathy:    She has no cervical adenopathy.  Skin: Skin is warm and dry. There is erythema (scabbed scaling area at hairline along neck).          Assessment & Plan:

## 2012-08-18 NOTE — Assessment & Plan Note (Signed)
Chronic problem for pt.  Has hx of skin flares when levels are abnormal.  Check levels and adjust meds prn.

## 2012-08-18 NOTE — Assessment & Plan Note (Signed)
New.  Pt's sxs and PE consistent w/ infxn.  Start abx.  Reviewed supportive care and red flags that should prompt return.  Pt expressed understanding and is in agreement w/ plan.  

## 2012-08-19 NOTE — Addendum Note (Signed)
Addended by: Silvio Pate D on: 08/19/2012 04:30 PM   Modules accepted: Orders

## 2012-08-21 LAB — URINE CULTURE: Colony Count: >=100000

## 2012-09-09 ENCOUNTER — Other Ambulatory Visit: Payer: Self-pay | Admitting: Women's Health

## 2012-09-09 DIAGNOSIS — M8580 Other specified disorders of bone density and structure, unspecified site: Secondary | ICD-10-CM

## 2012-09-16 ENCOUNTER — Ambulatory Visit (INDEPENDENT_AMBULATORY_CARE_PROVIDER_SITE_OTHER): Payer: BC Managed Care – PPO

## 2012-09-16 ENCOUNTER — Other Ambulatory Visit: Payer: Self-pay | Admitting: Gynecology

## 2012-09-16 DIAGNOSIS — M8580 Other specified disorders of bone density and structure, unspecified site: Secondary | ICD-10-CM

## 2012-09-16 DIAGNOSIS — M899 Disorder of bone, unspecified: Secondary | ICD-10-CM

## 2012-09-16 HISTORY — DX: Other specified disorders of bone density and structure, unspecified site: M85.80

## 2012-09-17 ENCOUNTER — Other Ambulatory Visit: Payer: Self-pay | Admitting: Obstetrics and Gynecology

## 2012-09-20 ENCOUNTER — Encounter: Payer: Self-pay | Admitting: Gynecology

## 2012-09-22 ENCOUNTER — Ambulatory Visit: Payer: BC Managed Care – PPO | Admitting: Family Medicine

## 2012-09-30 ENCOUNTER — Ambulatory Visit (INDEPENDENT_AMBULATORY_CARE_PROVIDER_SITE_OTHER): Payer: BC Managed Care – PPO | Admitting: Family Medicine

## 2012-09-30 ENCOUNTER — Telehealth: Payer: Self-pay | Admitting: Family Medicine

## 2012-09-30 VITALS — BP 126/74 | HR 61 | Temp 98.4°F | Wt 146.4 lb

## 2012-09-30 DIAGNOSIS — R300 Dysuria: Secondary | ICD-10-CM

## 2012-09-30 DIAGNOSIS — R350 Frequency of micturition: Secondary | ICD-10-CM

## 2012-09-30 DIAGNOSIS — R3989 Other symptoms and signs involving the genitourinary system: Secondary | ICD-10-CM

## 2012-09-30 DIAGNOSIS — N390 Urinary tract infection, site not specified: Secondary | ICD-10-CM

## 2012-09-30 DIAGNOSIS — R399 Unspecified symptoms and signs involving the genitourinary system: Secondary | ICD-10-CM

## 2012-09-30 LAB — POCT URINALYSIS DIPSTICK
Glucose, UA: NEGATIVE
Spec Grav, UA: <=1.005
Urobilinogen, UA: 0.2
pH, UA: 5

## 2012-09-30 MED ORDER — CIPROFLOXACIN HCL 500 MG PO TABS: 500.0000 mg | ORAL_TABLET | Freq: Two times a day (BID) | ORAL | Status: DC

## 2012-09-30 NOTE — Patient Instructions (Addendum)
This is consistent w/ UTI Start the Cipro twice daily x3 days LOTS of fluids REST! Hang in there!!!

## 2012-09-30 NOTE — Progress Notes (Signed)
  Subjective:    Patient ID: Sylvia Stevens, female    DOB: 09/23/1950, 62 y.o.   MRN: [id number]  HPI ? UTI- sxs started yesterday w/ frequency and dysuria.  No hematuria.  + chills.  + suprapubic pain, no CVA tenderness.   Review of Systems For ROS see HPI     Objective:   Physical Exam  Vitals reviewed. Constitutional: She appears well-developed and well-nourished. No distress.  Abdominal: Soft. She exhibits no distension. There is tenderness (+ suprapubic tenderness, no CVA tenderness).          Assessment & Plan:

## 2012-09-30 NOTE — Assessment & Plan Note (Signed)
Pt's sxs and UA consistent w/ infxn.  Start abx.  Await cx results.

## 2012-09-30 NOTE — Telephone Encounter (Signed)
Patient Information:  Caller Name: Joshlynn  Phone: [phone]  Patient: Sylvia Stevens, Sylvia Stevens  Gender: Female  DOB: 17-Aug-1951  Age: 62 Years  PCP: Sheliah Hatch.  Office Follow Up:  Does the office need to follow up with this patient?: No  Instructions For The Office: N/A  RN Note:  Patient states she developed urinary urgency, frequency, pain and burning with urination. Onset 09/29/12 1800. Patient taking fluids well. Care advice given per guidelines. Call back parameters reviewed. Patient verbalizes understanding.  Symptoms  Reason For Call & Symptoms: Allergies: PCN, Erythromycin Meds: Levothyroxine, Lisinopril HX; HTN, Hyptothyroidism  Reviewed Health History In EMR: Yes  Reviewed Medications In EMR: Yes  Reviewed Allergies In EMR: Yes  Reviewed Surgeries / Procedures: Yes  Date of Onset of Symptoms: 09/29/2012  Any Fever: Yes  Fever Taken: Oral  Fever Time Of Reading: 00:00:00  Fever Last Reading: 100  Guideline(s) Used:  Urination Pain - Female  Disposition Per Guideline:   See Today in Office  Reason For Disposition Reached:   Age > 50 years  Advice Given:  Fluids:   Drink extra fluids. Drink 8-10 glasses of liquids a day (Reason: to produce a dilute, non-irritating urine).  Cranberry Juice:   Some people think that drinking cranberry juice may help in fighting urinary tract infections. However, there is no good research that has ever proved this.  Dosage 100% Cranberry Juice: 1 oz (30 ml) twice a day.  Call Back If:   You become worse.  Appointment Scheduled:  09/30/2012 13:00:00 Appointment Scheduled Provider:  Sheliah Hatch.

## 2012-10-03 LAB — URINE CULTURE: Colony Count: >=100000

## 2012-11-16 ENCOUNTER — Encounter: Payer: Self-pay | Admitting: Family Medicine

## 2012-11-16 ENCOUNTER — Ambulatory Visit (INDEPENDENT_AMBULATORY_CARE_PROVIDER_SITE_OTHER): Payer: BC Managed Care – PPO | Admitting: Family Medicine

## 2012-11-16 VITALS — BP 120/60 | HR 61 | Temp 98.6°F | Ht 62.5 in | Wt 149.2 lb

## 2012-11-16 DIAGNOSIS — E039 Hypothyroidism, unspecified: Secondary | ICD-10-CM

## 2012-11-16 LAB — T3, FREE: T3, Free: 3.2 pg/mL (ref 2.3–4.2)

## 2012-11-16 LAB — T4, FREE: Free T4: 1.51 ng/dL (ref 0.60–1.60)

## 2012-11-16 NOTE — Assessment & Plan Note (Signed)
Deteriorated.  Pt has all sxs of hypothyroid.  Will get labs to determine if meds need to be adjusted.  If meds again normal, will refer to Endo to determine why pt is having sxs w/ normal hormone levels.  Will follow closely.  Pt expressed understanding and is in agreement w/ plan.

## 2012-11-16 NOTE — Patient Instructions (Addendum)
We'll notify you of your lab results and make any changes to meds if needed If labs are normal, we'll refer to endo Remember to schedule your physical for July Call with any questions or concerns Happy Belated Birthday!!!

## 2012-11-16 NOTE — Progress Notes (Signed)
  Subjective:    Patient ID: Sylvia Stevens, female    DOB: 1950/11/04, 62 y.o.   MRN: [id number]  HPI Hypothyroid- chronic problem, currently on 100 mcg levothyroxine daily.  Pt reports fatigue, cold, constipated, dry skin- these are pt's 'typical' low thyroid sxs.  No palpitations, sweats, diarrhea.  At last lab check, had low TSH but normal free T3/T4.   Review of Systems For ROS see HPI     Objective:   Physical Exam  Vitals reviewed. Constitutional: She is oriented to person, place, and time. She appears well-developed and well-nourished. No distress.  HENT:  Head: Normocephalic and atraumatic.  Eyes: Conjunctivae and EOM are normal. Pupils are equal, round, and reactive to light.  Neck: Normal range of motion. Neck supple. No thyromegaly present.  Cardiovascular: Normal rate, regular rhythm, normal heart sounds and intact distal pulses.   No murmur heard. Pulmonary/Chest: Effort normal and breath sounds normal. No respiratory distress.  Abdominal: Soft. She exhibits no distension. There is no tenderness.  Musculoskeletal: She exhibits no edema.  Lymphadenopathy:    She has no cervical adenopathy.  Neurological: She is alert and oriented to person, place, and time.  Skin: Skin is warm and dry.  Circular patch of dry, erythematous skin along R forehead/hairline  Psychiatric: She has a normal mood and affect. Her behavior is normal.          Assessment & Plan:

## 2012-11-22 ENCOUNTER — Ambulatory Visit (INDEPENDENT_AMBULATORY_CARE_PROVIDER_SITE_OTHER): Payer: BC Managed Care – PPO | Admitting: Internal Medicine

## 2012-11-22 ENCOUNTER — Encounter: Payer: Self-pay | Admitting: Internal Medicine

## 2012-11-22 ENCOUNTER — Telehealth: Payer: Self-pay | Admitting: Family Medicine

## 2012-11-22 VITALS — BP 122/64 | HR 80 | Temp 98.7°F | Resp 10 | Ht 62.6 in | Wt 150.0 lb

## 2012-11-22 DIAGNOSIS — E039 Hypothyroidism, unspecified: Secondary | ICD-10-CM

## 2012-11-22 MED ORDER — SYNTHROID 100 MCG PO TABS: 100.0000 ug | ORAL_TABLET | Freq: Every day | ORAL | Status: DC

## 2012-11-22 NOTE — Progress Notes (Signed)
Subjective:     Patient ID: Sylvia Stevens, female   DOB: 04-12-1951, 62 y.o.   MRN: [id number]  HPI Sylvia Stevens is a 62 year old woman, referred by her PCP, Dr. Beverely Low, for management of uncontrolled hypothyroidism.  Patient has been diagnosed with hypothyroidism in 2008. She has been on levothyroxine (generic) 100 mcg daily since dx, with recent decrease in her TSH 0.24 on 11/16/2012, previously 0.26, previously at the lower limit of normal (0.44, and 0.51 a year ago). Her last free T4 was 3.2 (2.3-4.2) and free T4 was 1.51. She takes calcium carbonate-vitamin D supplements. She is also on Ergocalciferol every other week. She takes the calcium at night. She takes the Levothyroxine with water upon awakening. She eats at least one hour later.   She has a patch of dry skin in the back of her neck that appeared right before she was dx and it reappeared and extended. The patient experiences fatigue, being cold, constipation, dry skin, hair falling, weight gain 7 lbs in 6 months (despite being in a program at work, similar to "biggest loser") but no palpitations, increased sweating, frequent bowel movements, tremors). She does not exercise as she does not have the energy.   She has a history of hypertension, osteopenia, vitamin D deficiency, tobacco use, arthritis, bursitis. She had a steroid injection a year ago, in shoulder. She takes NSAIDs occasionally. No kelp or seaweed. No herbal supplements. No iv contrast. No weight loss pills. No h/o URI this winter.   No FH of hypothyroidism. No FH of autoimmune ds.   Review of Systems Constitutional: + weight gain, + fatigue, + subjective hypothermia all the time Eyes: no blurry vision, no xerophthalmia ENT: no sore throat, no nodules palpated in throat, no dysphagia/odynophagia, + a little hoarseness Cardiovascular: no CP/SOB/palpitations/leg swelling Respiratory: no cough - only occasionally /SOB Gastrointestinal: no N/V/D/+ C Musculoskeletal: + muscle/+  joint aches Skin: no rashes other than the dry skin patch on scalp, + itching Neurological: no tremors/numbness/tingling/dizziness Psychiatric: no depression/anxiety Low libido   Past Medical History  Diagnosis Date  . Endometrial polyp   . Osteopenia 09/16/2012    T score -1.3 FRAX 7.6%/0.9%  . Thyroid disease     hypothyroid  . Arthritis   . Hypertension     Past Surgical History  Procedure Laterality Date  . Tubal ligation    . Cesarean section      X 2  . Carpal tunnel release      X 2  . Bladder bx    . Appendectomy    . Hernia repair    . Toe surgery     History   Social History  . Marital Status: Married    Spouse Name: N/A    Number of Children: 2  . Years of Education: N/A   Occupational History  . HR manager   Social History Main Topics  . Smoking status: Current Every Day Smoker -- 0.50 packs/day    Types: Cigarettes  . Smokeless tobacco: Not on file  . Alcohol Use: Yes, 2 drinks a month     Comment: rare  . Drug Use: Not on file  . Sexually Active: No  For exercise, she walks daily.  Current Outpatient Prescriptions on File Prior to Visit  Medication Sig Dispense Refill  . Ascorbic Acid (VITAMIN C PO) Take by mouth.        Marland Kitchen aspirin 81 MG tablet Take 81 mg by mouth daily.        Marland Kitchen  Calcium Carbonate-Vitamin D (CALCIUM + D PO) Take by mouth 2 (two) times daily.        . ciprofloxacin (CIPRO) 500 MG tablet Take 1 tablet (500 mg total) by mouth 2 (two) times daily.  6 tablet  0  . Fish Oil-Cholecalciferol (FISH OIL + D3) 1200-1000 MG-UNIT CAPS Take by mouth.        Marland Kitchen lisinopril (PRINIVIL,ZESTRIL) 10 MG tablet Take 1 tablet (10 mg total) by mouth daily.  30 tablet  6  . naproxen (NAPROSYN) 500 MG tablet Take 1 tablet (500 mg total) by mouth 2 (two) times daily with a meal.  60 tablet  0  . Vitamin D, Ergocalciferol, (DRISDOL) 50000 UNITS CAPS TAKE ONE CAPSULE BY MOUTH EVERY 14 DAYS  7 capsule  6   No current facility-administered medications on file  prior to visit.   Allergies  Allergen Reactions  . Erythromycin   . Flexeril (Cyclobenzaprine)     Vomit   . Lobster (Shellfish Allergy)   . Penicillins   . Strawberry    Family History  Problem Relation Age of Onset  . Alzheimer's disease Mother   . Heart disease Father   . Pancreatic cancer Father   . Arthritis Father   . Hyperlipidemia Father   . Hypertension Father   . Cancer Paternal Aunt     Ovarian or Uterine cancer  . Aneurysm Paternal Uncle    Objective:   Physical Exam BP 122/64  Pulse 80  Temp(Src) 98.7 F (37.1 C) (Oral)  Resp 10  Ht 5' 2.6" (1.59 m)  Wt 150 lb (68.04 kg)  BMI 26.91 kg/m2  SpO2 96% Wt Readings from Last 3 Encounters:  11/22/12 150 lb (68.04 kg)  11/16/12 149 lb 3.2 oz (67.677 kg)  09/30/12 146 lb 6.4 oz (66.407 kg)  Constitutional: overweight, in NAD, strong tobacco smell Eyes: PERRLA, EOMI, no exophthalmos ENT: moist mucous membranes, no thyromegaly, no cervical lymphadenopathy Cardiovascular: RRR, No MRG Respiratory: CTA B Gastrointestinal: abdomen soft, NT, ND, BS+ Musculoskeletal: no deformities, strength intact in all 4 Skin: moist, dry, dry patch of skin upper for head Neurological: no tremor with outstretched hands, DTR normal in all 4  Assessment:     1. Hypothyroidism  - on stable dose of levothyroxine generic since diagnosis    Plan:     Patient has long-standing hypothyroidism, on the same dose of generic levothyroxine from diagnosis, with recent TSH levels that are lower than the lower limit of normal. - She is symptomatic mostly with hypothyroid-like symptoms - I explained that the symptoms of abnormal thyroid function can be interchangeable, and it is not impossible for patients that are overly replaced with levothyroxine to develop hypothyroid-like symptoms. She does admit that she has problems sleeping for more than 3 hours at a time at night, but no other hyperthyroid symptoms - I can find no other obvious  reasons for her low TSH (please see history of present illness) - she is taking the Synthroid correctly - I can feel no nodules in her thyroid at palpation. She does not have neck compression symptoms. - The only reason that I can find for her low TSH if the possibility of different thyroid hormone concentrations in the generic levothyroxine formulation. I explained that I would recommend Synthroid (brand name) for the more consistent thyroid hormone concentration between different lots. I will send Synthroid to her pharmacy, at 100 mcg daily, 45 tablets, to last her until the next check, which will be in  4-6 weeks - I will call her with the results of her next labs, and see if we need to change her Synthroid dose - I will see her back for another appointment in 6 months

## 2012-11-22 NOTE — Telephone Encounter (Signed)
Refill: Lisinopril 10 mg tablets. Take 1 tablet by mouth every day. Qty 30. Last fill 10-16-12

## 2012-11-22 NOTE — Patient Instructions (Signed)
I sent brand-name Synthroid to your pharmacy - please start this and return in 4-6 weeks for labs.  Return for another appointment in 6 months.

## 2012-11-24 MED ORDER — LISINOPRIL 10 MG PO TABS: 10.0000 mg | ORAL_TABLET | Freq: Every day | ORAL | Status: DC

## 2012-12-17 ENCOUNTER — Telehealth: Payer: Self-pay | Admitting: Family Medicine

## 2012-12-17 NOTE — Telephone Encounter (Signed)
Patient Information:  Caller Name: Sylvia Stevens  Phone: [phone]  Patient: Sylvia Stevens, Sylvia Stevens  Gender: Female  DOB: [birth year]  Age: 62 Years  PCP: Sheliah Hatch.  Office Follow Up:  Does the office need to follow up with this patient?: No  Instructions For The Office: N/A  RN Note:  Health Education provided on Viral Meningitis.  Understanding expressed. Encouraged to call back for questions, changes or concerns.  Symptoms  Reason For Call & Symptoms: A co-worker was diagnosed Wednesday Afternoon with Viral meningitis. Awaiting Cultures pending report.  What does she  need to do?  Reviewed Health Information on Viral Meningitis with caller. She is not experiencing any current issues.  Reviewed s/sx of vial Menigitis and disease process.  Understanding expressed.  Reviewed Health History In EMR: Yes  Reviewed Medications In EMR: Yes  Reviewed Allergies In EMR: Yes  Reviewed Surgeries / Procedures: Yes  Date of Onset of Symptoms: 12/15/2012  Guideline(s) Used:  No Protocol Available - Information Only  Disposition Per Guideline:   Home Care  Reason For Disposition Reached:   Information only question and nurse able to answer  Advice Given:  Call Back If:  New symptoms develop  You become worse.  Patient Will Follow Care Advice:  YES

## 2012-12-17 NOTE — Telephone Encounter (Signed)
Noted  

## 2012-12-27 ENCOUNTER — Ambulatory Visit: Payer: BC Managed Care – PPO

## 2012-12-27 DIAGNOSIS — E039 Hypothyroidism, unspecified: Secondary | ICD-10-CM

## 2012-12-28 ENCOUNTER — Other Ambulatory Visit: Payer: Self-pay | Admitting: Internal Medicine

## 2012-12-28 DIAGNOSIS — E039 Hypothyroidism, unspecified: Secondary | ICD-10-CM

## 2012-12-28 MED ORDER — SYNTHROID 88 MCG PO TABS: 88.0000 ug | ORAL_TABLET | Freq: Every day | ORAL | Status: DC

## 2013-02-03 ENCOUNTER — Telehealth: Payer: Self-pay | Admitting: Internal Medicine

## 2013-02-03 NOTE — Telephone Encounter (Signed)
Patient wants to know if Dr Donia Ast wants her to come back and have labs (TSH) since started new meds. Please advise.

## 2013-02-03 NOTE — Telephone Encounter (Signed)
Called pt and lvm that Dr Elvera Lennox said yes, the orders are in the computer and she can stop by our office anytime to do her labs. Advised her if she has any further questions or concerns to call us back.

## 2013-02-03 NOTE — Telephone Encounter (Signed)
Yes! The orders are in from last check, she should drop by the lab any time. Thank you.

## 2013-02-04 ENCOUNTER — Other Ambulatory Visit (INDEPENDENT_AMBULATORY_CARE_PROVIDER_SITE_OTHER): Payer: BC Managed Care – PPO

## 2013-02-04 DIAGNOSIS — E039 Hypothyroidism, unspecified: Secondary | ICD-10-CM

## 2013-02-04 LAB — T4, FREE: Free T4: 1.11 ng/dL (ref 0.60–1.60)

## 2013-02-04 LAB — TSH: TSH: 0.65 u[IU]/mL (ref 0.35–5.50)

## 2013-02-10 ENCOUNTER — Ambulatory Visit: Payer: BC Managed Care – PPO | Admitting: Internal Medicine

## 2013-03-04 ENCOUNTER — Encounter: Payer: Self-pay | Admitting: Family Medicine

## 2013-03-04 ENCOUNTER — Ambulatory Visit (INDEPENDENT_AMBULATORY_CARE_PROVIDER_SITE_OTHER): Payer: BC Managed Care – PPO | Admitting: Family Medicine

## 2013-03-04 VITALS — BP 130/70 | HR 69 | Temp 98.5°F | Ht 62.5 in | Wt 151.4 lb

## 2013-03-04 DIAGNOSIS — L0292 Furuncle, unspecified: Secondary | ICD-10-CM | POA: Insufficient documentation

## 2013-03-04 MED ORDER — DOXYCYCLINE HYCLATE 100 MG PO TABS: 100.0000 mg | ORAL_TABLET | Freq: Two times a day (BID) | ORAL | Status: DC

## 2013-03-04 NOTE — Progress Notes (Signed)
  Subjective:    Patient ID: Sylvia Stevens, female    DOB: November 05, 1950, 62 y.o.   MRN: [id number]  HPI Boil- located between upper thigh and genitals on R.  Noted this AM in shower when painful to touch.  No known drainage.  Hx of similar.  No fevers.  No other areas currently affected.     Review of Systems For ROS see HPI     Objective:   Physical Exam  Vitals reviewed. Constitutional: She appears well-developed and well-nourished. No distress.  Skin: Skin is warm and dry. There is erythema (1.5 cm area of erythema and induration in R groin w/out drainage or fluctuance).          Assessment & Plan:

## 2013-03-04 NOTE — Assessment & Plan Note (Signed)
New.  Unable to I&D at this time as there is no fluctuance present.  Start oral abx.  Reviewed supportive care and red flags that should prompt return.  Pt expressed understanding and is in agreement w/ plan.

## 2013-03-04 NOTE — Patient Instructions (Addendum)
This is infected but not ready to be drained Start the Doxy twice daily- take w/ food Hot compresses/soaks to attempt to bring it to a head Call with any questions or concerns Hang in there!!!

## 2013-03-25 ENCOUNTER — Ambulatory Visit (INDEPENDENT_AMBULATORY_CARE_PROVIDER_SITE_OTHER): Payer: BC Managed Care – PPO | Admitting: Family Medicine

## 2013-03-25 ENCOUNTER — Encounter: Payer: Self-pay | Admitting: Family Medicine

## 2013-03-25 VITALS — BP 110/60 | HR 57 | Temp 98.4°F | Ht 62.5 in | Wt 147.8 lb

## 2013-03-25 DIAGNOSIS — Z0000 Encounter for general adult medical examination without abnormal findings: Secondary | ICD-10-CM

## 2013-03-25 DIAGNOSIS — Z1331 Encounter for screening for depression: Secondary | ICD-10-CM

## 2013-03-25 LAB — CBC WITH DIFFERENTIAL/PLATELET
Eosinophils Relative: 3.3 % (ref 0.0–5.0)
HCT: 41.4 % (ref 36.0–46.0)
Monocytes Relative: 7.3 % (ref 3.0–12.0)
Neutrophils Relative %: 62.6 % (ref 43.0–77.0)
Platelets: 276 10*3/uL (ref 150.0–400.0)
WBC: 6.2 10*3/uL (ref 4.5–10.5)

## 2013-03-25 LAB — BASIC METABOLIC PANEL
CO2: 28 mEq/L (ref 19–32)
Calcium: 10 mg/dL (ref 8.4–10.5)
Chloride: 105 mEq/L (ref 96–112)
GFR: 57.19 mL/min — ABNORMAL LOW (ref >60.00)
Glucose, Bld: 86 mg/dL (ref 70–99)
Potassium: 4.2 mEq/L (ref 3.5–5.1)
Sodium: 139 mEq/L (ref 135–145)

## 2013-03-25 LAB — TSH: TSH: 0.91 u[IU]/mL (ref 0.35–5.50)

## 2013-03-25 LAB — HEPATIC FUNCTION PANEL
ALT: 18 U/L (ref 0–35)
Bilirubin, Direct: 0 mg/dL (ref 0.0–0.3)
Total Bilirubin: 0.5 mg/dL (ref 0.3–1.2)

## 2013-03-25 LAB — LIPID PANEL
Cholesterol: 214 mg/dL — ABNORMAL HIGH (ref 0–200)
Total CHOL/HDL Ratio: 4
Triglycerides: 94 mg/dL (ref 0.0–149.0)
VLDL: 18.8 mg/dL (ref 0.0–40.0)

## 2013-03-25 MED ORDER — LISINOPRIL 5 MG PO TABS: 5.0000 mg | ORAL_TABLET | Freq: Every day | ORAL | Status: DC

## 2013-03-25 NOTE — Progress Notes (Signed)
  Subjective:    Patient ID: Sylvia Stevens, female    DOB: 11-Aug-1951, 62 y.o.   MRN: [id number]  HPI CPE- UTD on GYN, colonoscopy.     Review of Systems Patient reports no vision/ hearing changes, adenopathy,fever, weight change,  persistant/recurrent hoarseness , swallowing issues, chest pain, palpitations, edema, persistant/recurrent cough, hemoptysis, dyspnea (rest/exertional/paroxysmal nocturnal), gastrointestinal bleeding (melena, rectal bleeding), abdominal pain, significant heartburn, bowel changes, GU symptoms (dysuria, hematuria, incontinence), Gyn symptoms (abnormal  bleeding, pain),  syncope, focal weakness, memory loss, numbness & tingling, skin/hair/nail changes, abnormal bruising or bleeding, anxiety, or depression.     Objective:   Physical Exam General Appearance:    Alert, cooperative, no distress, appears stated age  Head:    Normocephalic, without obvious abnormality, atraumatic  Eyes:    PERRL, conjunctiva/corneas clear, EOM's intact, fundi    benign, both eyes  Ears:    Normal TM's and external ear canals, both ears  Nose:   Nares normal, septum midline, mucosa normal, no drainage    or sinus tenderness  Throat:   Lips, mucosa, and tongue normal; teeth and gums normal  Neck:   Supple, symmetrical, trachea midline, no adenopathy;    Thyroid: no enlargement/tenderness/nodules  Back:     Symmetric, no curvature, ROM normal, no CVA tenderness  Lungs:     Clear to auscultation bilaterally, respirations unlabored  Chest Wall:    No tenderness or deformity   Heart:    Regular rate and rhythm, S1 and S2 normal, no murmur, rub   or gallop  Breast Exam:    Deferred to GYN  Abdomen:     Soft, non-tender, bowel sounds active all four quadrants,    no masses, no organomegaly  Genitalia:    Deferred to GYN  Rectal:    Extremities:   Extremities normal, atraumatic, no cyanosis or edema  Pulses:   2+ and symmetric all extremities  Skin:   Skin color, texture, turgor normal, no  rashes or lesions  Lymph nodes:   Cervical, supraclavicular, and axillary nodes normal  Neurologic:   CNII-XII intact, normal strength, sensation and reflexes    throughout          Assessment & Plan:

## 2013-03-25 NOTE — Assessment & Plan Note (Signed)
Pt's PE WNL.  UTD on health maintenance.  Check labs.  Encouraged smoking cessation.  Anticipatory guidance provided.

## 2013-03-25 NOTE — Patient Instructions (Signed)
Follow up in 6 months to recheck BP Decrease the Lisinopril to 1/2 tab daily (5 mg) until you pick up the new script and then 1 tab daily We'll notify you of your lab results and make any changes if needed Call with any questions or concerns Have a great summer!!

## 2013-03-27 ENCOUNTER — Encounter: Payer: Self-pay | Admitting: Family Medicine

## 2013-03-30 LAB — VITAMIN D 1,25 DIHYDROXY: Vitamin D2 1, 25 (OH)2: 33 pg/mL

## 2013-05-16 ENCOUNTER — Telehealth: Payer: Self-pay | Admitting: Family Medicine

## 2013-05-16 NOTE — Telephone Encounter (Signed)
Patient Information:  Caller Name: Priscilla  Phone: [phone]  Patient: Sylvia Stevens, Sylvia Stevens  Gender: Female  DOB: 04/06/1951  Age: 62 Years  PCP: Sheliah Hatch.  Office Follow Up:  Does the office need to follow up with this patient?: No  Instructions For The Office: N/A  RN Note:  Pt is a smoker and does have HTN. RN advised to hang up and dial 911 for chest pain > 5 minutes, but pt declined and stated if symptoms worsen she will go to ED.  Symptoms  Reason For Call & Symptoms: Today, 05/16/2013, pt calling with c/o chest pain that started associated with sore throat, nausea  - This morning woke  still with chest pain , but not as severe and has coughed  "one time" and thinks she has bronchitits. Chest pain worsens with deep breath.  Reviewed Health History In EMR: Yes  Reviewed Medications In EMR: Yes  Reviewed Allergies In EMR: Yes  Reviewed Surgeries / Procedures: Yes  Date of Onset of Symptoms: 05/15/2013  Treatments Tried: Aleve for sore throat  Treatments Tried Worked: Yes  Guideline(s) Used:  Chest Pain  Disposition Per Guideline:   Call EMS 911 Now  Reason For Disposition Reached:   Chest pain lasting longer than 5 minutes and ANY of the following:  Over 76 years old Over 48 years old and at least one cardiac risk factor (i.e., high blood pressure, diabetes, high cholesterol, obesity, smoker or strong family history of heart disease) Pain is crushing, pressure-like, or heavy  Took nitroglycerin and chest pain was not relieved History of heart disease (i.e., angina, heart attack, bypass surgery, angioplasty, CHF)  Advice Given:  N/A  Patient Refused Recommendation:  Patient Will Go To ED  Pt wanted OV, but given c/o chest pain  advised 911 DISP. Pt stated  she will go to ED if symptoms worsen.

## 2013-05-16 NOTE — Telephone Encounter (Signed)
Encounter closed per PT notification she will seek treatment at the ED.

## 2013-05-25 ENCOUNTER — Ambulatory Visit: Payer: BC Managed Care – PPO | Admitting: Internal Medicine

## 2013-05-25 ENCOUNTER — Ambulatory Visit (INDEPENDENT_AMBULATORY_CARE_PROVIDER_SITE_OTHER): Payer: BC Managed Care – PPO | Admitting: Family Medicine

## 2013-05-25 ENCOUNTER — Encounter: Payer: Self-pay | Admitting: Family Medicine

## 2013-05-25 VITALS — BP 126/84 | HR 95 | Temp 99.4°F | Wt 146.8 lb

## 2013-05-25 DIAGNOSIS — J209 Acute bronchitis, unspecified: Secondary | ICD-10-CM | POA: Insufficient documentation

## 2013-05-25 DIAGNOSIS — J440 Chronic obstructive pulmonary disease with acute lower respiratory infection: Secondary | ICD-10-CM | POA: Insufficient documentation

## 2013-05-25 MED ORDER — DOXYCYCLINE HYCLATE 100 MG PO TABS: 100.0000 mg | ORAL_TABLET | Freq: Two times a day (BID) | ORAL | Status: DC

## 2013-05-25 MED ORDER — PROMETHAZINE-DM 6.25-15 MG/5ML PO SYRP: 5.0000 mL | ORAL_SOLUTION | Freq: Four times a day (QID) | ORAL | Status: DC | PRN

## 2013-05-25 NOTE — Assessment & Plan Note (Signed)
Pt w/ deep, hacking cough and hx of tobacco use.  Also w/ sinus tenderness.  Start abx.  Cough meds prn.  Reviewed supportive care and red flags that should prompt return.  Pt expressed understanding and is in agreement w/ plan.

## 2013-05-25 NOTE — Patient Instructions (Addendum)
Start the Doxycycline twice daily- take w/ food Use the cough syrup as needed- will cause drowsiness Mucinex DM for daytime cough Drink plenty of fluids REST! Hang in there!!!

## 2013-05-25 NOTE — Progress Notes (Signed)
  Subjective:    Patient ID: Sylvia Stevens, female    DOB: [birth year], 62 y.o.   MRN: [id number]  HPI URI- sxs started 10 days ago w/ sore throat.  + cough- mostly dry, deep, barking.  + nasal congestion- started yesterday.  Some sinus pressure.  No ear pain.  + swollen glands.  Chest tightness.  + low grade temps.  Initial nausea.  + tobacco use.  + sick contacts.   Review of Systems For ROS see HPI     Objective:   Physical Exam  Constitutional: She appears well-developed and well-nourished. No distress.  HENT:  Head: Normocephalic and atraumatic.  TMs normal bilaterally Mild nasal congestion Throat w/out erythema, edema, or exudate Mild TTP over frontal and maxillary sinuses  Eyes: Conjunctivae and EOM are normal. Pupils are equal, round, and reactive to light.  Neck: Normal range of motion. Neck supple.  Cardiovascular: Normal rate, regular rhythm, normal heart sounds and intact distal pulses.   No murmur heard. Pulmonary/Chest: Effort normal and breath sounds normal. No respiratory distress. She has no wheezes.  + hacking cough  Lymphadenopathy:    She has no cervical adenopathy.          Assessment & Plan:

## 2013-05-26 ENCOUNTER — Ambulatory Visit: Payer: BC Managed Care – PPO | Admitting: Internal Medicine

## 2013-06-03 ENCOUNTER — Ambulatory Visit (INDEPENDENT_AMBULATORY_CARE_PROVIDER_SITE_OTHER): Payer: BC Managed Care – PPO | Admitting: Internal Medicine

## 2013-06-03 ENCOUNTER — Encounter: Payer: Self-pay | Admitting: Internal Medicine

## 2013-06-03 VITALS — BP 104/68 | HR 71 | Temp 98.9°F | Resp 12 | Wt 145.0 lb

## 2013-06-03 DIAGNOSIS — E039 Hypothyroidism, unspecified: Secondary | ICD-10-CM

## 2013-06-03 LAB — T4, FREE: Free T4: 1.11 ng/dL (ref 0.60–1.60)

## 2013-06-03 NOTE — Progress Notes (Signed)
  Subjective:     Patient ID: Sylvia Stevens, female   DOB: [birth year], 62 y.o.   MRN: [id number]  HPI Sylvia Stevens is a 62 year old woman, referred by her PCP, returning for f/u for hypothyroidism, dx 2008.  Pt has a deep, barking, cough and recently saw Dr Beverely Low for bronchitis (cough,sore throat, and low fever). She is on ABx (tonight last dose).  Reviewed thyroid tests: Lab Results  Component Value Date   TSH 0.91 03/25/2013   TSH 0.65 02/04/2013   TSH 0.21* 12/27/2012   TSH 0.24* 11/16/2012   TSH 0.26* 08/18/2012   TSH 0.44 03/22/2012   TSH 0.51 09/23/2011   FREET4 1.11 02/04/2013   FREET4 1.51 11/16/2012   FREET4 1.38 08/18/2012  She takes the Synthroid 88 mcg correctly, in am.  She lost 6 lbs since June 2014, is very happy about this - intentional.  She is not taking the calcium anymore as constipated.   She denies fatigue, but has constipation, dry skin, hair falling; no palpitations, increased sweating, frequent bowel movements, tremors.   She has a history of hypertension, osteopenia, vitamin D deficiency, tobacco use, arthritis, bursitis.   I reviewed pt's medications, allergies, PMH, social hx, family hx and no changes required, except as mentioned above. Also, Lisinopril dose reduced.  Review of Systems Constitutional: + weight loss, no fatigue, no subjective hyperthermia/hypothermia Eyes: no blurry vision, no xerophthalmia ENT: + sore throat, no nodules palpated in throat, no dysphagia/odynophagia, no hoarseness Cardiovascular: no CP/SOB/palpitations/leg swelling Respiratory: + cough/no SOB Gastrointestinal: no N/V/D/C Musculoskeletal: no muscle/joint aches Skin: no rashes Neurological: no tremors/numbness/tingling/dizziness  Objective:   Physical Exam BP 104/68  Pulse 71  Temp(Src) 98.9 F (37.2 C) (Oral)  Resp 12  Wt 145 lb (65.772 kg)  BMI 26.08 kg/m2  SpO2 97% Wt Readings from Last 3 Encounters:  06/03/13 145 lb (65.772 kg)  05/25/13 146 lb 12.8 oz (66.588  kg)  03/25/13 147 lb 12.8 oz (67.042 kg)  Constitutional: normal weight, in NAD, strong tobacco smell, coughing Eyes: PERRLA, EOMI, no exophthalmos ENT: moist mucous membranes, no thyromegaly, no cervical lymphadenopathy Cardiovascular: RRR, No MRG Respiratory: CTA B Gastrointestinal: abdomen soft, NT, ND, BS+ Musculoskeletal: no deformities, strength intact in all 4 Skin: dry, no rashes Neurological: no tremor with outstretched hands  Assessment:     1. Hypothyroidism  - on stable dose of levothyroxine generic since diagnosis    Plan:     Patient has long-standing hypothyroidism, with low TSH few mo ago, now normalized on 88 mcg of Levothyroxine daily - She appears euthyroid - she is taking the Levothyroxine correctly - I can feel no nodules in her thyroid at palpation. She does not have neck compression symptoms. - repeat labs today: TSH and fT4 - I will call her with the results of her next labs, and see if we need to change her Synthroid dose - I will see her back for another appointment in 6 months  Office Visit on 06/03/2013  Component Date Value Range Status  . TSH 06/03/2013 0.66  0.35 - 5.50 uIU/mL Final  . Free T4 06/03/2013 1.11  0.60 - 1.60 ng/dL Final   Msg sent to continue currentLevothyroxine  regimen.

## 2013-06-03 NOTE — Patient Instructions (Addendum)
Please return in 6 months. Please stop at the lab >> will send you the results through MyChart.

## 2013-06-17 ENCOUNTER — Encounter: Payer: Self-pay | Admitting: Gynecology

## 2013-07-06 ENCOUNTER — Ambulatory Visit (INDEPENDENT_AMBULATORY_CARE_PROVIDER_SITE_OTHER): Payer: BC Managed Care – PPO | Admitting: Family Medicine

## 2013-07-06 ENCOUNTER — Encounter: Payer: Self-pay | Admitting: Family Medicine

## 2013-07-06 VITALS — BP 98/68 | HR 72 | Temp 98.2°F | Resp 16 | Wt 144.0 lb

## 2013-07-06 DIAGNOSIS — I10 Essential (primary) hypertension: Secondary | ICD-10-CM

## 2013-07-06 NOTE — Patient Instructions (Signed)
Follow up as scheduled STOP the Lisinopril Monitor your BP periodically and call if it shoots back up Call with any questions or concerns Happy Holidays!

## 2013-07-06 NOTE — Assessment & Plan Note (Signed)
Chronic problem.  As pt continues to lose weight, BP is now dropping low.  Stop Lisinopril.  Monitor BP.  Will continue to follow.

## 2013-07-06 NOTE — Progress Notes (Signed)
  Subjective:    Patient ID: Sylvia Stevens, female    DOB: [birth year], 62 y.o.   MRN: [id number]  HPI HTN- currently on Lisinopril 5 mg.  BP dropped to 77/49 while doing yard work.  Pt was dizzy.  No CP, SOB, HAs, visual changes, edema.  Pt continues to lose weight.   Review of Systems For ROS see HPI     Objective:   Physical Exam  Vitals reviewed. Constitutional: She is oriented to person, place, and time. She appears well-developed and well-nourished. No distress.  HENT:  Head: Normocephalic and atraumatic.  Eyes: Conjunctivae and EOM are normal. Pupils are equal, round, and reactive to light.  Neck: Normal range of motion. Neck supple. No thyromegaly present.  Cardiovascular: Normal rate, regular rhythm, normal heart sounds and intact distal pulses.   No murmur heard. Pulmonary/Chest: Effort normal and breath sounds normal. No respiratory distress.  Abdominal: Soft. She exhibits no distension. There is no tenderness.  Musculoskeletal: She exhibits no edema.  Lymphadenopathy:    She has no cervical adenopathy.  Neurological: She is alert and oriented to person, place, and time.  Skin: Skin is warm and dry.  Psychiatric: She has a normal mood and affect. Her behavior is normal.          Assessment & Plan:

## 2013-08-03 ENCOUNTER — Encounter: Payer: BC Managed Care – PPO | Admitting: Gynecology

## 2013-08-10 ENCOUNTER — Encounter: Payer: Self-pay | Admitting: Gynecology

## 2013-08-19 ENCOUNTER — Encounter: Payer: Self-pay | Admitting: Internal Medicine

## 2013-08-19 ENCOUNTER — Other Ambulatory Visit: Payer: Self-pay | Admitting: *Deleted

## 2013-08-19 ENCOUNTER — Telehealth: Payer: Self-pay | Admitting: *Deleted

## 2013-08-19 DIAGNOSIS — E039 Hypothyroidism, unspecified: Secondary | ICD-10-CM

## 2013-08-19 MED ORDER — LEVOTHYROXINE SODIUM 88 MCG PO TABS: 88.0000 ug | ORAL_TABLET | Freq: Every day | ORAL | Status: DC

## 2013-08-19 NOTE — Telephone Encounter (Signed)
Noted, rx sent for generic

## 2013-08-19 NOTE — Telephone Encounter (Signed)
OK to give generic?

## 2013-08-19 NOTE — Telephone Encounter (Signed)
Pt states her insurance has changed and brand name Synthroid is going to cost her over $100, she wants to know if it's okay to switch to generic?  If so she needs an rx today to be sent to [facility].

## 2013-08-22 ENCOUNTER — Ambulatory Visit (INDEPENDENT_AMBULATORY_CARE_PROVIDER_SITE_OTHER): Payer: BC Managed Care – PPO | Admitting: Family Medicine

## 2013-08-22 ENCOUNTER — Ambulatory Visit (INDEPENDENT_AMBULATORY_CARE_PROVIDER_SITE_OTHER)
Admission: RE | Admit: 2013-08-22 | Discharge: 2013-08-22 | Disposition: A | Discharge status: Home / Self Care | Payer: BC Managed Care – PPO | Source: Ambulatory Visit | Attending: Family Medicine | Admitting: Family Medicine

## 2013-08-22 ENCOUNTER — Encounter: Payer: Self-pay | Admitting: Family Medicine

## 2013-08-22 VITALS — BP 138/86 | HR 74

## 2013-08-22 DIAGNOSIS — S6992XA Unspecified injury of left wrist, hand and finger(s), initial encounter: Secondary | ICD-10-CM

## 2013-08-22 DIAGNOSIS — S60947A Unspecified superficial injury of left little finger, initial encounter: Secondary | ICD-10-CM

## 2013-08-22 DIAGNOSIS — S6980XA Other specified injuries of unspecified wrist, hand and finger(s), initial encounter: Secondary | ICD-10-CM

## 2013-08-22 DIAGNOSIS — S60949A Unspecified superficial injury of unspecified finger, initial encounter: Secondary | ICD-10-CM

## 2013-08-22 MED ORDER — MELOXICAM 15 MG PO TABS: 15.0000 mg | ORAL_TABLET | Freq: Every day | ORAL | Status: DC

## 2013-08-22 NOTE — Patient Instructions (Signed)
Very nice to meet you You have a small fracture.  Wear splint day and night for next 2 weeks. Can take off in to wash.  Ice 20 minutes 2 times daily.  Meloxicam daily for 1 week then as needed.  Tylenol can be added if you need.

## 2013-08-22 NOTE — Assessment & Plan Note (Signed)
Patient has what appears to be a finger avulsion fracture at likely more secondary to ligamentous injury and true fracture. This is extra-articular. Patient was given a splint that I would like her to wear daily and nightly for the next week and then start doing range of motion exercises that she was given. Patient will continue the splint for another 2 weeks at night. Patient knows that if she has any disc comfort she'll come back within 2 weeks. Patient was given meloxicam to help with the swelling even though that this may decrease bone healing. Patient though is unable to straighten orthotics or even Tylenol on a regular basis due to nausea and vomiting. Discussed icing protocol. His all this patient is doing well I would like to see her again in 4 weeks to make sure that this area heals. I would likely do an ultrasound instead of x-ray to make sure everything is And soft tissues are doing well.

## 2013-08-22 NOTE — Progress Notes (Signed)
  CC: Finger pain  HPI: Patient is a pleasant 62 year old right-hand-dominant female coming in with a left fifth finger injury. Patient was attempting to catch a falling casserole dish. Patient states that she had pain and swelling almost immediately. Patient states that at work she continued to have pain and wanted to be evaluated. Patient denies any numbness or tingling. Patient cannot take narcotics and was adamant about that. Patient states that the pain is approximately 5/10 but she has a very high threshold. Has never injured this finger before.   Past medical, surgical, family and social history reviewed. Medications reviewed all in the electronic medical record.   Review of Systems: No headache, visual changes, nausea, vomiting, diarrhea, constipation, dizziness, abdominal pain, skin rash, fevers, chills, night sweats, weight loss, swollen lymph nodes, body aches, joint swelling, muscle aches, chest pain, shortness of breath, mood changes.   Objective:    Blood pressure 138/86, pulse 74, SpO2 96.00%.   General: No apparent distress alert and oriented x3 mood and affect normal, dressed appropriately.  HEENT: Pupils equal, extraocular movements intact Respiratory: Patient's speak in full sentences and does not appear short of breath Cardiovascular: No lower extremity edema, non tender, no erythema Skin: Warm dry intact with no signs of infection or rash on extremities or on axial skeleton. Abdomen: Soft nontender Neuro: Cranial nerves II through XII are intact, neurovascularly intact in all extremities with 2+ DTRs and 2+ pulses. Lymph: No lymphadenopathy of posterior or anterior cervical chain or axillae bilaterally.  Gait normal with good balance and coordination.  MSK: Non tender with full range of motion and good stability and symmetric strength and tone of shoulders, elbows, wrist, hip, knee and ankles bilaterally.  Hand exam on the left hand shows the patient does have some mild  swelling and discoloration just proximal to the PIP joint. There is no significant deformity noted. Patient actually has full flexion and extension with no angulation noted on inspection. She is neurovascularly intact distally. Patient's index finger of his hand no does have angulation from prior injury. Contralateral hand unremarkable X-rays were ordered reviewed and interpreted by me today. X-rays show that patient does have a flex side on the lateral or ulnar aspect of the fifth finger. This likely corresponds to a ligamentous injury. No association with articular surface.  Impression and Recommendations:     This case required medical decision making of moderate complexity.

## 2013-09-17 ENCOUNTER — Other Ambulatory Visit: Payer: Self-pay | Admitting: Gynecology

## 2013-09-21 ENCOUNTER — Encounter: Payer: Self-pay | Admitting: Family Medicine

## 2013-09-21 ENCOUNTER — Other Ambulatory Visit (INDEPENDENT_AMBULATORY_CARE_PROVIDER_SITE_OTHER): Payer: BC Managed Care – PPO

## 2013-09-21 ENCOUNTER — Ambulatory Visit (INDEPENDENT_AMBULATORY_CARE_PROVIDER_SITE_OTHER): Payer: BC Managed Care – PPO | Admitting: Family Medicine

## 2013-09-21 VITALS — BP 120/68 | HR 97 | Temp 98.5°F | Resp 16 | Wt 146.0 lb

## 2013-09-21 DIAGNOSIS — S60949A Unspecified superficial injury of unspecified finger, initial encounter: Secondary | ICD-10-CM

## 2013-09-21 DIAGNOSIS — S60947A Unspecified superficial injury of left little finger, initial encounter: Secondary | ICD-10-CM

## 2013-09-21 NOTE — Assessment & Plan Note (Signed)
Patient is doing very well. Encourage her to continue the vitamin D. Patient is doing extremely well but it do not think splinting is necessary. Discussed with patient that it'll take 12-24 weeks for the bone to be reabsorbed. Patient will likely have some mild deformity but overall she seems to be completely functional. Patient will followup on an as-needed basis.

## 2013-09-21 NOTE — Patient Instructions (Signed)
Great to see you Ice if you need Continue Vitamin D  See me when you need me.

## 2013-09-21 NOTE — Progress Notes (Signed)
  CC: Finger pain  HPI: Patient is a pleasant 63 year old right-hand-dominant female coming in for follow up of a left fifth finger injury. Patient was seen previously and had a avulsion fracture just proximal to the joint on the ulnar aspect of the fifth finger. Patient was put in a splint and wearing a 23 hours a day for 2 weeks and then nightly for 2 weeks. Patient states that the pain is much better. Patient only has some mild discomfort with full flexion. Patient stopped wearing the splint approximately 2 weeks ago. Patient is crocheting and states that this has been helpful. Patient denies any new symptoms such as numbness or weakness.  Past medical, surgical, family and social history reviewed. Medications reviewed all in the electronic medical record.   Review of Systems: No headache, visual changes, nausea, vomiting, diarrhea, constipation, dizziness, abdominal pain, skin rash, fevers, chills, night sweats, weight loss, swollen lymph nodes, body aches, joint swelling, muscle aches, chest pain, shortness of breath, mood changes.   Objective:    Blood pressure 120/68, pulse 97, temperature 98.5 F (36.9 C), temperature source Oral, resp. rate 16, weight 146 lb (66.225 kg), SpO2 97.00%.   General: No apparent distress alert and oriented x3 mood and affect normal, dressed appropriately.  HEENT: Pupils equal, extraocular movements intact Respiratory: Patient's speak in full sentences and does not appear short of breath Cardiovascular: No lower extremity edema, non tender, no erythema Skin: Warm dry intact with no signs of infection or rash on extremities or on axial skeleton. Abdomen: Soft nontender Neuro: Cranial nerves II through XII are intact, neurovascularly intact in all extremities with 2+ DTRs and 2+ pulses. Lymph: No lymphadenopathy of posterior or anterior cervical chain or axillae bilaterally.  Gait normal with good balance and coordination.  MSK: Non tender with full range of  motion and good stability and symmetric strength and tone of shoulders, elbows, wrist, hip, knee and ankles bilaterally.  Hand exam on the left hand shows the patient has mild bony deformity on the radial side just proximal to the PIP joint.Patient actually has full flexion and extension with no angulation noted on inspection. She is neurovascularly intact distally. Patient's index finger of his hand no does have angulation from prior injury. Contralateral hand unremarkable  Limited musculoskeletal ultrasound was done and interpreted by Hulan Saas, M today. Limited ultrasound of the fifth finger shows patient has good callus formation with the ligament intact just proximal to the PIP joint. There is good increased upper flow.  Impression: Healing avulsion fracture of the fifth phalanx.   Impression and Recommendations:     This case required medical decision making of moderate complexity.

## 2013-09-26 ENCOUNTER — Ambulatory Visit: Payer: BC Managed Care – PPO | Admitting: Family Medicine

## 2013-09-27 ENCOUNTER — Ambulatory Visit (INDEPENDENT_AMBULATORY_CARE_PROVIDER_SITE_OTHER): Payer: BC Managed Care – PPO | Admitting: Family Medicine

## 2013-09-27 VITALS — BP 130/82 | Temp 98.7°F | Wt 145.8 lb

## 2013-09-27 DIAGNOSIS — I10 Essential (primary) hypertension: Secondary | ICD-10-CM

## 2013-09-27 NOTE — Progress Notes (Signed)
   Subjective:    Patient ID: Sylvia Stevens, female    DOB: 05-Sep-1951, 63 y.o.   MRN: [id number]  HPI HTN- pt has having problem w/ hypotension at last visit.  Lisinopril was stopped.  Pt reports feeling much better- no longer fatigued, dizzy, no feelings of going to pass out.  BP ranging 113-125/70s.  No CP, SOB, HAs, visual changes, edema.   Review of Systems For ROS see HPI     Objective:   Physical Exam  Vitals reviewed. Constitutional: She is oriented to person, place, and time. She appears well-developed and well-nourished. No distress.  HENT:  Head: Normocephalic and atraumatic.  Eyes: Conjunctivae and EOM are normal. Pupils are equal, round, and reactive to light.  Neck: Normal range of motion. Neck supple. No thyromegaly present.  Cardiovascular: Normal rate, regular rhythm, normal heart sounds and intact distal pulses.   No murmur heard. Pulmonary/Chest: Effort normal and breath sounds normal. No respiratory distress.  Abdominal: Soft. She exhibits no distension. There is no tenderness.  Musculoskeletal: She exhibits no edema.  Lymphadenopathy:    She has no cervical adenopathy.  Neurological: She is alert and oriented to person, place, and time.  Skin: Skin is warm and dry.  Psychiatric: She has a normal mood and affect. Her behavior is normal.          Assessment & Plan:

## 2013-09-27 NOTE — Assessment & Plan Note (Signed)
Pt doing well off medication.  Asymptomatic.  Will continue to follow and restart meds prn.

## 2013-09-27 NOTE — Progress Notes (Signed)
Pre visit review using our clinic review tool, if applicable. No additional management support is needed unless otherwise documented below in the visit note. 

## 2013-09-27 NOTE — Patient Instructions (Signed)
Schedule your complete physical for July Keep up the good work!  You look great! Try and quit smoking Call with any questions or concerns Happy New Year!!

## 2013-10-10 ENCOUNTER — Telehealth: Payer: Self-pay | Admitting: Family Medicine

## 2013-10-10 NOTE — Telephone Encounter (Signed)
Relevant patient education assigned to patient using Emmi. ° °

## 2013-10-12 ENCOUNTER — Telehealth: Payer: Self-pay | Admitting: Family Medicine

## 2013-10-12 NOTE — Telephone Encounter (Signed)
Relevant patient education assigned to patient using Emmi. ° °

## 2013-11-14 ENCOUNTER — Ambulatory Visit: Payer: BC Managed Care – PPO | Admitting: Internal Medicine

## 2013-12-01 ENCOUNTER — Ambulatory Visit (INDEPENDENT_AMBULATORY_CARE_PROVIDER_SITE_OTHER): Payer: BC Managed Care – PPO | Admitting: Internal Medicine

## 2013-12-01 VITALS — BP 122/68 | HR 78 | Temp 98.8°F | Resp 12 | Wt 147.0 lb

## 2013-12-01 DIAGNOSIS — E559 Vitamin D deficiency, unspecified: Secondary | ICD-10-CM

## 2013-12-01 DIAGNOSIS — E039 Hypothyroidism, unspecified: Secondary | ICD-10-CM

## 2013-12-01 LAB — T4, FREE: Free T4: 1.06 ng/dL (ref 0.60–1.60)

## 2013-12-01 LAB — TSH: TSH: 1.32 u[IU]/mL (ref 0.35–5.50)

## 2013-12-01 NOTE — Progress Notes (Signed)
Subjective:     Patient ID: Sylvia Stevens, female   DOB: [birth year], 63 y.o.   MRN: [id number]  HPI Sylvia Stevens is a 63 y.o.woman, returning for f/u for hypothyroidism, dx 2008. Last visit 6 mo ago.  Reviewed thyroid tests: Lab Results  Component Value Date   TSH 0.66 06/03/2013   TSH 0.91 03/25/2013   TSH 0.65 02/04/2013   TSH 0.21* 12/27/2012   TSH 0.24* 11/16/2012   TSH 0.26* 08/18/2012   TSH 0.44 03/22/2012   TSH 0.51 09/23/2011   FREET4 1.11 06/03/2013   FREET4 1.11 02/04/2013   FREET4 1.51 11/16/2012   FREET4 1.38 08/18/2012  She takes the Synthroid 88 mcg correctly, in am. Eats b'fast >1h later.   She has fatigue, but has constipation, some dry skin, and hair falling - unchanged; no palpitations, increased sweating, frequent bowel movements, tremors.   She has a history of hypertension - lately resolved after eliminating the mold in her house, osteopenia, vitamin D deficiency, tobacco use, OA, bursitis.   I reviewed pt's medications, allergies, PMH, social hx, family hx and no changes required, except as mentioned above. Lisinopril stopped since last visit. She will retire next week.   Review of Systems Constitutional: + weight gain, + fatigue, no subjective hyperthermia/hypothermia, + poor sleep Eyes: no blurry vision, no xerophthalmia ENT: no sore throat, no nodules palpated in throat, no dysphagia/odynophagia, no hoarseness Cardiovascular: no CP/SOB/palpitations/leg swelling Respiratory: no cough/no SOB Gastrointestinal: no N/V/D/C Musculoskeletal: no muscle/+ joint aches - knees Skin: no rashes, + hair loss Neurological: no tremors/numbness/tingling/dizziness  Objective:   Physical Exam BP 122/68  Pulse 78  Temp(Src) 98.8 F (37.1 C) (Oral)  Resp 12  Wt 147 lb (66.679 kg)  SpO2 98% Wt Readings from Last 3 Encounters:  12/01/13 147 lb (66.679 kg)  09/27/13 145 lb 12.8 oz (66.134 kg)  09/21/13 146 lb (66.225 kg)   Constitutional: normal weight, in NAD,  coughing Eyes: PERRLA, EOMI, no exophthalmos ENT: moist mucous membranes, no thyromegaly, no cervical lymphadenopathy Cardiovascular: RRR, No MRG Respiratory: CTA B Gastrointestinal: abdomen soft, NT, ND, BS+ Musculoskeletal: no deformities, strength intact in all 4 Skin: dry, no rashes Neurological: no tremor with outstretched hands  Assessment:     1. Hypothyroidism  - on stable dose of levothyroxine generic since diagnosis    Plan:     Patient has long-standing hypothyroidism, with stable TSH levels, on 88 mcg of Levothyroxine daily - She appears euthyroid - she is taking the Levothyroxine correctly - I can feel no nodules in her thyroid at palpation. She does not have neck compression symptoms. - repeat labs today: TSH and fT4 - I will call her with the results of her next labs, and see if we need to change her Synthroid dose - I will see her back for another appointment in 1 year  Orders Placed This Encounter  Procedures  . TSH  . T4, free  . Vitamin D (25 hydroxy)    Office Visit on 12/01/2013  Component Date Value Ref Range Status  . TSH 12/01/2013 1.32  0.35 - 5.50 uIU/mL Final  . Free T4 12/01/2013 1.06  0.60 - 1.60 ng/dL Final  . Vit D, 25-Hydroxy 12/01/2013 28* 30 - 89 ng/mL Final   Comment: This assay accurately quantifies Vitamin D, which is the sum of the                          25-Hydroxy forms of  Vitamin D2 and D3.  Studies have shown that the                          optimum concentration of 25-Hydroxy Vitamin D is 30 ng/mL or higher.                           Concentrations of Vitamin D between 20 and 29 ng/mL are considered to                          be insufficient and concentrations less than 20 ng/mL are considered                          to be deficient for Vitamin D.   TFTs normal. Vit D low >> start 2000 IU OTC vit D.

## 2013-12-01 NOTE — Patient Instructions (Signed)
Please return in 1 year. Please stop at the lab. 

## 2013-12-02 ENCOUNTER — Encounter: Payer: Self-pay | Admitting: Internal Medicine

## 2013-12-02 LAB — VITAMIN D 25 HYDROXY (VIT D DEFICIENCY, FRACTURES): VIT D 25 HYDROXY: 28 ng/mL — AB (ref 30–89)

## 2014-01-02 ENCOUNTER — Ambulatory Visit (INDEPENDENT_AMBULATORY_CARE_PROVIDER_SITE_OTHER): Payer: BC Managed Care – PPO | Admitting: Family Medicine

## 2014-01-02 ENCOUNTER — Encounter: Payer: Self-pay | Admitting: Family Medicine

## 2014-01-02 VITALS — BP 112/74 | HR 77 | Temp 99.5°F | Wt 143.0 lb

## 2014-01-02 DIAGNOSIS — R52 Pain, unspecified: Secondary | ICD-10-CM

## 2014-01-02 DIAGNOSIS — R509 Fever, unspecified: Secondary | ICD-10-CM

## 2014-01-02 DIAGNOSIS — J209 Acute bronchitis, unspecified: Secondary | ICD-10-CM

## 2014-01-02 LAB — POCT INFLUENZA A/B
Influenza A, POC: NEGATIVE
Influenza B, POC: NEGATIVE

## 2014-01-02 MED ORDER — DOXYCYCLINE HYCLATE 100 MG PO TABS: 100.0000 mg | ORAL_TABLET | Freq: Two times a day (BID) | ORAL | Status: DC

## 2014-01-02 MED ORDER — PROMETHAZINE-DM 6.25-15 MG/5ML PO SYRP: 5.0000 mL | ORAL_SOLUTION | Freq: Four times a day (QID) | ORAL | Status: DC | PRN

## 2014-01-02 NOTE — Patient Instructions (Signed)

## 2014-01-02 NOTE — Progress Notes (Signed)
Pre visit review using our clinic review tool, if applicable. No additional management support is needed unless otherwise documented below in the visit note. 

## 2014-01-02 NOTE — Progress Notes (Signed)
  Subjective:     Sylvia Stevens is a 63 y.o. female who presents for evaluation of fever. She has had the fever for 4 days. Symptoms have been gradually worsening. Symptoms are described as fevers up to 102 degrees. Associated symptoms are body aches, chills and dry cough. Patient denies abdominal pain, diarrhea, nausea, urinary tract symptoms and vomiting.  She has tried to alleviate the symptoms with acetaminophen, aspirin and ibuprofen with moderate relief. The patient has no known comorbidities (structural heart/valvular disease, prosthetic joints, immunocompromised state, recent dental work, known abscesses).  The following portions of the patient's history were reviewed and updated as appropriate: allergies, current medications, past family history, past medical history, past social history, past surgical history and problem list.  Review of Systems Pertinent items are noted in HPI.   Objective:    BP 112/74  Pulse 77  Temp(Src) 99.5 F (37.5 C) (Oral)  Wt 143 lb (64.864 kg)  SpO2 97% General appearance: alert, cooperative, appears stated age and no distress Ears: normal TM's and external ear canals both ears Nose: Nares normal. Septum midline. Mucosa normal. No drainage or sinus tenderness. Throat: lips, mucosa, and tongue normal; teeth and gums normal Neck: no adenopathy, supple, symmetrical, trachea midline and thyroid not enlarged, symmetric, no tenderness/mass/nodules Lungs: rhonchi bilaterally Heart: S1, S2 normal   Assessment:    Fever is likely secondary to bronchitis.   Plan:    Antibiotics as per orders. Follow up in 2 weeks or as needed.

## 2014-01-03 ENCOUNTER — Telehealth: Payer: Self-pay | Admitting: Family Medicine

## 2014-01-03 NOTE — Telephone Encounter (Signed)
Relevant patient education assigned to patient using Emmi. ° °

## 2014-02-16 ENCOUNTER — Other Ambulatory Visit: Payer: Self-pay | Admitting: Internal Medicine

## 2014-03-18 ENCOUNTER — Other Ambulatory Visit: Payer: Self-pay | Admitting: Internal Medicine

## 2014-03-27 ENCOUNTER — Encounter: Payer: Self-pay | Admitting: Family Medicine

## 2014-03-27 ENCOUNTER — Telehealth: Payer: Self-pay | Admitting: Family Medicine

## 2014-03-27 ENCOUNTER — Ambulatory Visit (INDEPENDENT_AMBULATORY_CARE_PROVIDER_SITE_OTHER): Payer: BC Managed Care – PPO | Admitting: Family Medicine

## 2014-03-27 ENCOUNTER — Other Ambulatory Visit (HOSPITAL_COMMUNITY)
Admission: RE | Admit: 2014-03-27 | Discharge: 2014-03-27 | Disposition: A | Discharge status: Home / Self Care | Payer: BC Managed Care – PPO | Source: Ambulatory Visit | Attending: Family Medicine | Admitting: Family Medicine

## 2014-03-27 VITALS — BP 120/80 | HR 64 | Temp 98.3°F | Resp 16 | Ht 62.75 in | Wt 140.4 lb

## 2014-03-27 DIAGNOSIS — Z01419 Encounter for gynecological examination (general) (routine) without abnormal findings: Secondary | ICD-10-CM | POA: Insufficient documentation

## 2014-03-27 DIAGNOSIS — M5412 Radiculopathy, cervical region: Secondary | ICD-10-CM

## 2014-03-27 DIAGNOSIS — Z0000 Encounter for general adult medical examination without abnormal findings: Secondary | ICD-10-CM

## 2014-03-27 DIAGNOSIS — Z1151 Encounter for screening for human papillomavirus (HPV): Secondary | ICD-10-CM | POA: Insufficient documentation

## 2014-03-27 LAB — CBC WITH DIFFERENTIAL/PLATELET
BASOS ABS: 0 10*3/uL (ref 0.0–0.1)
Basophils Relative: 0.6 % (ref 0.0–3.0)
EOS ABS: 0.2 10*3/uL (ref 0.0–0.7)
Eosinophils Relative: 2.4 % (ref 0.0–5.0)
HCT: 41.3 % (ref 36.0–46.0)
Hemoglobin: 13.8 g/dL (ref 12.0–15.0)
LYMPHS PCT: 25.2 % (ref 12.0–46.0)
Lymphs Abs: 1.7 10*3/uL (ref 0.7–4.0)
MCHC: 33.3 g/dL (ref 30.0–36.0)
MCV: 95.1 fl (ref 78.0–100.0)
Monocytes Absolute: 0.4 10*3/uL (ref 0.1–1.0)
Monocytes Relative: 6.4 % (ref 3.0–12.0)
NEUTROS ABS: 4.4 10*3/uL (ref 1.4–7.7)
Neutrophils Relative %: 65.4 % (ref 43.0–77.0)
Platelets: 246 10*3/uL (ref 150.0–400.0)
RBC: 4.35 Mil/uL (ref 3.87–5.11)
RDW: 15 % (ref 11.5–15.5)
WBC: 6.7 10*3/uL (ref 4.0–10.5)

## 2014-03-27 LAB — BASIC METABOLIC PANEL
BUN: 12 mg/dL (ref 6–23)
CHLORIDE: 106 meq/L (ref 96–112)
CO2: 28 mEq/L (ref 19–32)
Calcium: 9.5 mg/dL (ref 8.4–10.5)
Creatinine, Ser: 1.1 mg/dL (ref 0.4–1.2)
GFR: 55.16 mL/min — ABNORMAL LOW (ref >60.00)
GLUCOSE: 85 mg/dL (ref 70–99)
POTASSIUM: 3.6 meq/L (ref 3.5–5.1)
SODIUM: 140 meq/L (ref 135–145)

## 2014-03-27 LAB — LIPID PANEL
CHOLESTEROL: 206 mg/dL — AB (ref 0–200)
HDL: 50.2 mg/dL (ref >39.00)
LDL CALC: 135 mg/dL — AB (ref 0–99)
NonHDL: 155.8
Total CHOL/HDL Ratio: 4
Triglycerides: 104 mg/dL (ref 0.0–149.0)
VLDL: 20.8 mg/dL (ref 0.0–40.0)

## 2014-03-27 LAB — HEPATIC FUNCTION PANEL
ALK PHOS: 52 U/L (ref 39–117)
ALT: 13 U/L (ref 0–35)
AST: 20 U/L (ref 0–37)
Albumin: 4 g/dL (ref 3.5–5.2)
BILIRUBIN DIRECT: 0 mg/dL (ref 0.0–0.3)
Total Bilirubin: 0.7 mg/dL (ref 0.2–1.2)
Total Protein: 7 g/dL (ref 6.0–8.3)

## 2014-03-27 LAB — TSH: TSH: 0.74 u[IU]/mL (ref 0.35–4.50)

## 2014-03-27 NOTE — Progress Notes (Signed)
Pre visit review using our clinic review tool, if applicable. No additional management support is needed unless otherwise documented below in the visit note. 

## 2014-03-27 NOTE — Patient Instructions (Signed)
Follow up 1 year or as needed We'll notify you of your lab results and make any changes if needed We'll call you with your ortho appt Call with any questions or concerns Enjoy the rest of your summer!

## 2014-03-27 NOTE — Assessment & Plan Note (Signed)
Refer to ortho.

## 2014-03-27 NOTE — Telephone Encounter (Signed)
Relevant patient education assigned to patient using Emmi. ° °

## 2014-03-27 NOTE — Assessment & Plan Note (Signed)
Pt's PE WNL w/ exception of radicular pain.  Pt UTD on mammo.  Pap done today.  Check labs.  Encouraged smoking cessation- pt not interested at this time.  Anticipatory guidance provided.

## 2014-03-27 NOTE — Progress Notes (Signed)
   Subjective:    Patient ID: Sylvia Stevens, female    DOB: 03-28-1951, 63 y.o.   MRN: [id number]  HPI CPE- UTD on colonoscopy, mammo, DEXA.  Due for pap this year- no longer has GYN.  Exercising regularly since retiring in March.  Has dropped 1-2 pants sizes.   Review of Systems Patient reports no vision/ hearing changes, adenopathy,fever, weight change,  persistant/recurrent hoarseness , swallowing issues, chest pain, palpitations, edema, persistant/recurrent cough, hemoptysis, dyspnea (rest/exertional/paroxysmal nocturnal), gastrointestinal bleeding (melena, rectal bleeding), abdominal pain, significant heartburn, bowel changes, GU symptoms (dysuria, hematuria, incontinence), Gyn symptoms (abnormal  bleeding, pain),  syncope, memory loss, numbness & tingling, skin/hair/nail changes, abnormal bruising or bleeding, anxiety, or depression.  + LUE numbness and weakness w/ turning head to L or extending arm or internally rotating behind back (to get bra).    Objective:   Physical Exam  General Appearance:    Alert, cooperative, no distress, appears stated age  Head:    Normocephalic, without obvious abnormality, atraumatic  Eyes:    PERRL, conjunctiva/corneas clear, EOM's intact, fundi    benign, both eyes  Ears:    Normal TM's and external ear canals, both ears  Nose:   Nares normal, septum midline, mucosa normal, no drainage    or sinus tenderness  Throat:   Lips, mucosa, and tongue normal; teeth and gums normal  Neck:   Supple, symmetrical, trachea midline, no adenopathy;    Thyroid: no enlargement/tenderness/nodules  Back:     Symmetric, no curvature, ROM normal, no CVA tenderness  Lungs:     Clear to auscultation bilaterally, respirations unlabored  Chest Wall:    No tenderness or deformity   Heart:    Regular rate and rhythm, S1 and S2 normal, no murmur, rub   or gallop  Breast Exam:    No tenderness, masses, or nipple abnormality  Abdomen:     Soft, non-tender, bowel sounds active  all four quadrants,    no masses, no organomegaly  Genitalia:    External genitalia normal, cervix normal in appearance, no CMT, uterus in normal size and position, adnexa w/out mass or tenderness, mucosa pink and moist, no lesions or discharge present  Rectal:    Normal external appearance  Extremities:   Extremities normal, atraumatic, no cyanosis or edema  Pulses:   2+ and symmetric all extremities  Skin:   Skin color, texture, turgor normal, no rashes or lesions  Lymph nodes:   Cervical, supraclavicular, and axillary nodes normal  Neurologic:   CNII-XII intact, normal strength, sensation and reflexes    throughout          Assessment & Plan:

## 2014-03-28 LAB — VITAMIN D 25 HYDROXY (VIT D DEFICIENCY, FRACTURES): VITD: 53.36 ng/mL

## 2014-03-29 LAB — CYTOLOGY - PAP

## 2014-06-19 LAB — HM MAMMOGRAPHY

## 2014-06-27 ENCOUNTER — Encounter: Payer: Self-pay | Admitting: General Practice

## 2014-07-07 ENCOUNTER — Other Ambulatory Visit: Payer: Self-pay | Admitting: Internal Medicine

## 2014-07-10 ENCOUNTER — Encounter: Payer: Self-pay | Admitting: Family Medicine

## 2014-08-16 ENCOUNTER — Other Ambulatory Visit: Payer: Self-pay | Admitting: Internal Medicine

## 2014-09-10 ENCOUNTER — Other Ambulatory Visit: Payer: Self-pay | Admitting: Internal Medicine

## 2014-10-16 ENCOUNTER — Other Ambulatory Visit: Payer: Self-pay | Admitting: Internal Medicine

## 2014-11-11 ENCOUNTER — Other Ambulatory Visit: Payer: Self-pay | Admitting: Internal Medicine

## 2014-12-09 ENCOUNTER — Other Ambulatory Visit: Payer: Self-pay | Admitting: Internal Medicine

## 2014-12-11 ENCOUNTER — Other Ambulatory Visit: Payer: Self-pay | Admitting: *Deleted

## 2014-12-11 MED ORDER — LEVOTHYROXINE SODIUM 88 MCG PO TABS: ORAL_TABLET | ORAL | Status: DC

## 2014-12-22 ENCOUNTER — Ambulatory Visit (INDEPENDENT_AMBULATORY_CARE_PROVIDER_SITE_OTHER): Payer: BLUE CROSS/BLUE SHIELD | Admitting: Internal Medicine

## 2014-12-22 ENCOUNTER — Encounter: Payer: Self-pay | Admitting: Internal Medicine

## 2014-12-22 VITALS — BP 120/62 | HR 95 | Temp 99.1°F | Resp 12 | Wt 147.0 lb

## 2014-12-22 DIAGNOSIS — E039 Hypothyroidism, unspecified: Secondary | ICD-10-CM

## 2014-12-22 LAB — TSH: TSH: 2.61 u[IU]/mL (ref 0.35–4.50)

## 2014-12-22 LAB — T4, FREE: Free T4: 0.98 ng/dL (ref 0.60–1.60)

## 2014-12-22 NOTE — Patient Instructions (Signed)
Please stop at the lab.  Continue Levothyroxine 88 mcg daily.  Please return in 1 year.

## 2014-12-22 NOTE — Progress Notes (Signed)
  Subjective:     Patient ID: Sylvia Stevens, female   DOB: 12-May-1951, 64 y.o.   MRN: [id number]  HPI Sylvia Stevens is a 64 y.o.woman, returning for f/u for hypothyroidism, dx 2008. Last visit 1 year ago.  Reviewed thyroid tests: Lab Results  Component Value Date   TSH 0.74 03/27/2014   TSH 1.32 12/01/2013   TSH 0.66 06/03/2013   TSH 0.91 03/25/2013   TSH 0.65 02/04/2013   TSH 0.21* 12/27/2012   TSH 0.24* 11/16/2012   TSH 0.26* 08/18/2012   TSH 0.44 03/22/2012   TSH 0.51 09/23/2011   FREET4 1.06 12/01/2013   FREET4 1.11 06/03/2013   FREET4 1.11 02/04/2013   FREET4 1.51 11/16/2012   FREET4 1.38 08/18/2012   She takes the Levothyroxine 88 mcg correctly - in am - with tea - eats b'fast >1h later - no Ca, MVI, PPI  She denies: - fatigue - constipation - dry skin - hair falling - unchanged - no palpitations  She has: - poor sleep  She has more L hand tremor.   She has a history of hypertension - lately resolved after eliminating the mold in her house, osteopenia, vitamin D deficiency, tobacco use, OA, bursitis.   I reviewed pt's medications, allergies, PMH, social hx, family hx, and changes were documented in the history of present illness. Otherwise, unchanged from my initial visit note.   Review of Systems Constitutional: see HPI Eyes: no blurry vision, no xerophthalmia ENT: no sore throat, no nodules palpated in throat, no dysphagia/odynophagia, no hoarseness; + upper cervical LAD B (URI in March) Cardiovascular: no CP/SOB/palpitations/leg swelling Respiratory: no cough/no SOB Gastrointestinal: no N/V/D/C Musculoskeletal: no muscle/+ joint aches - knees Skin: no rashes, + hair loss Neurological: no tremors/numbness/tingling/dizziness  Objective:   Physical Exam BP 120/62 mmHg  Pulse 95  Temp(Src) 99.1 F (37.3 C) (Oral)  Resp 12  Wt 147 lb (66.679 kg)  SpO2 97% Wt Readings from Last 3 Encounters:  12/22/14 147 lb (66.679 kg)  03/27/14 140 lb 6 oz (63.674 kg)   01/02/14 143 lb (64.864 kg)   Constitutional: normal weight, in NAD, coughing Eyes: PERRLA, EOMI, no exophthalmos ENT: moist mucous membranes, no thyromegaly, no cervical lymphadenopathy Cardiovascular: tachycardia, RR, No MRG Respiratory: CTA B Gastrointestinal: abdomen soft, NT, ND, BS+ Musculoskeletal: no deformities, strength intact in all 4 Skin: dry, no rashes Neurological: no tremor with outstretched hands  Assessment:     1. Hypothyroidism  - on stable dose of levothyroxine generic since diagnosis    Plan:     Patient has long-standing hypothyroidism, with stable TSH levels, on 88 mcg of Levothyroxine daily - She appears euthyroid - she is taking the Levothyroxine correctly - I can feel no nodules in her thyroid at palpation. She does not have neck compression symptoms. - repeat labs today: TSH and fT4 - I will call her with the results of her next labs, and see if we need to change her Synthroid dose - I will see her back for another appointment in 1 year  Office Visit on 12/22/2014  Component Date Value Ref Range Status  . TSH 12/22/2014 2.61  0.35 - 4.50 uIU/mL Final  . Free T4 12/22/2014 0.98  0.60 - 1.60 ng/dL Final   Normal thyroid tests. Continue the same dose of levothyroxine, 88 g daily.

## 2015-03-13 ENCOUNTER — Telehealth: Payer: Self-pay | Admitting: Family Medicine

## 2015-03-13 ENCOUNTER — Other Ambulatory Visit: Payer: Self-pay | Admitting: Internal Medicine

## 2015-03-13 NOTE — Telephone Encounter (Signed)
pre visit letter mailed 03/08/15

## 2015-03-28 ENCOUNTER — Telehealth: Payer: Self-pay | Admitting: Behavioral Health

## 2015-03-28 NOTE — Telephone Encounter (Signed)
Unable to reach patient at time of Pre-Visit Call.  Left message for patient to return call when available.    

## 2015-03-29 ENCOUNTER — Encounter: Payer: Self-pay | Admitting: Family Medicine

## 2015-03-29 ENCOUNTER — Other Ambulatory Visit: Payer: Self-pay | Admitting: General Practice

## 2015-03-29 ENCOUNTER — Ambulatory Visit (INDEPENDENT_AMBULATORY_CARE_PROVIDER_SITE_OTHER): Payer: BLUE CROSS/BLUE SHIELD | Admitting: Family Medicine

## 2015-03-29 VITALS — BP 124/74 | HR 69 | Temp 97.9°F | Resp 17 | Ht 63.0 in | Wt 140.4 lb

## 2015-03-29 DIAGNOSIS — R208 Other disturbances of skin sensation: Secondary | ICD-10-CM

## 2015-03-29 DIAGNOSIS — H53451 Other localized visual field defect, right eye: Secondary | ICD-10-CM

## 2015-03-29 DIAGNOSIS — Z1231 Encounter for screening mammogram for malignant neoplasm of breast: Secondary | ICD-10-CM | POA: Diagnosis not present

## 2015-03-29 DIAGNOSIS — E785 Hyperlipidemia, unspecified: Secondary | ICD-10-CM

## 2015-03-29 DIAGNOSIS — M8580 Other specified disorders of bone density and structure, unspecified site: Secondary | ICD-10-CM | POA: Diagnosis not present

## 2015-03-29 DIAGNOSIS — Z0000 Encounter for general adult medical examination without abnormal findings: Secondary | ICD-10-CM | POA: Diagnosis not present

## 2015-03-29 DIAGNOSIS — H53411 Scotoma involving central area, right eye: Secondary | ICD-10-CM

## 2015-03-29 DIAGNOSIS — R200 Anesthesia of skin: Secondary | ICD-10-CM | POA: Insufficient documentation

## 2015-03-29 LAB — CBC WITH DIFFERENTIAL/PLATELET
BASOS ABS: 0 10*3/uL (ref 0.0–0.1)
Basophils Relative: 0.6 % (ref 0.0–3.0)
EOS PCT: 2 % (ref 0.0–5.0)
Eosinophils Absolute: 0.1 10*3/uL (ref 0.0–0.7)
HCT: 43.4 % (ref 36.0–46.0)
Hemoglobin: 14.6 g/dL (ref 12.0–15.0)
LYMPHS PCT: 24.5 % (ref 12.0–46.0)
Lymphs Abs: 1.8 10*3/uL (ref 0.7–4.0)
MCHC: 33.5 g/dL (ref 30.0–36.0)
MCV: 93.4 fl (ref 78.0–100.0)
Monocytes Absolute: 0.4 10*3/uL (ref 0.1–1.0)
Monocytes Relative: 6 % (ref 3.0–12.0)
Neutro Abs: 4.9 10*3/uL (ref 1.4–7.7)
Neutrophils Relative %: 66.9 % (ref 43.0–77.0)
PLATELETS: 258 10*3/uL (ref 150.0–400.0)
RBC: 4.65 Mil/uL (ref 3.87–5.11)
RDW: 14.7 % (ref 11.5–15.5)
WBC: 7.3 10*3/uL (ref 4.0–10.5)

## 2015-03-29 LAB — LIPID PANEL
Cholesterol: 214 mg/dL — ABNORMAL HIGH (ref 0–200)
HDL: 53.4 mg/dL (ref >39.00)
LDL Cholesterol: 142 mg/dL — ABNORMAL HIGH (ref 0–99)
NONHDL: 160.6
TRIGLYCERIDES: 91 mg/dL (ref 0.0–149.0)
Total CHOL/HDL Ratio: 4
VLDL: 18.2 mg/dL (ref 0.0–40.0)

## 2015-03-29 LAB — BASIC METABOLIC PANEL
BUN: 16 mg/dL (ref 6–23)
CALCIUM: 9.7 mg/dL (ref 8.4–10.5)
CO2: 28 mEq/L (ref 19–32)
Chloride: 106 mEq/L (ref 96–112)
Creatinine, Ser: 0.98 mg/dL (ref 0.40–1.20)
GFR: 60.85 mL/min (ref >60.00)
Glucose, Bld: 86 mg/dL (ref 70–99)
Potassium: 4 mEq/L (ref 3.5–5.1)
SODIUM: 142 meq/L (ref 135–145)

## 2015-03-29 LAB — HEPATIC FUNCTION PANEL
ALBUMIN: 4.3 g/dL (ref 3.5–5.2)
ALT: 15 U/L (ref 0–35)
AST: 17 U/L (ref 0–37)
Alkaline Phosphatase: 57 U/L (ref 39–117)
BILIRUBIN TOTAL: 0.6 mg/dL (ref 0.2–1.2)
Bilirubin, Direct: 0.1 mg/dL (ref 0.0–0.3)
Total Protein: 7 g/dL (ref 6.0–8.3)

## 2015-03-29 LAB — TSH: TSH: 1.31 u[IU]/mL (ref 0.35–4.50)

## 2015-03-29 LAB — VITAMIN D 25 HYDROXY (VIT D DEFICIENCY, FRACTURES): VITD: 45.45 ng/mL (ref 30.00–100.00)

## 2015-03-29 MED ORDER — ATORVASTATIN CALCIUM 20 MG PO TABS: 20.0000 mg | ORAL_TABLET | Freq: Every day | ORAL | Status: DC

## 2015-03-29 NOTE — Assessment & Plan Note (Signed)
Chronic problem for pt.  Due for DEXA.  Pt at higher risk due to smoking.  Order entered.

## 2015-03-29 NOTE — Assessment & Plan Note (Signed)
New.  Occurred x1 last week.  No hx of similar, none since.  Concern for possible migraine aura vs TIA vs other underlying neurologic issue.  Offered imaging but pt prefers to wait for neuro appt.  Reviewed supportive care and red flags that should prompt return.  Pt expressed understanding and is in agreement w/ plan.

## 2015-03-29 NOTE — Progress Notes (Signed)
   Subjective:    Patient ID: Sylvia Stevens, female    DOB: 16-Jul-1951, 64 y.o.   MRN: [id number]  HPI CPE- UTD on mammo (due in Oct, [facility]), due for DEXA, UTD on colonoscopy, pap (2015).   Review of Systems Patient reports no hearing changes, adenopathy,fever, weight change,  persistant/recurrent hoarseness , swallowing issues, chest pain, palpitations, edema, persistant/recurrent cough, hemoptysis, dyspnea (rest/exertional/paroxysmal nocturnal), gastrointestinal bleeding (melena, rectal bleeding), abdominal pain, significant heartburn, bowel changes, GU symptoms (dysuria, hematuria, incontinence), Gyn symptoms (abnormal  bleeding, pain),  syncope, memory loss, skin/hair/nail changes, abnormal bruising or bleeding, anxiety, or depression.  + fatigue- 'i just feel weak' + tremor of L hand and numbness of L leg for a 'few months'.  sxs start in lower leg and radiate up the front.  Worse w/ prolonged sitting or walking.  Difficult w/ balance. Visual field deficit- 1 week ago after mowing lawn (3 miles) R lateral visual field appeared to be 'like a kaleidoscope'.  Resolved spontaneously.  No HA.  UTD on eye exam, April.    Objective:   Physical Exam General Appearance:    Alert, cooperative, no distress, appears stated age  Head:    Normocephalic, without obvious abnormality, atraumatic  Eyes:    PERRL, conjunctiva/corneas clear, EOM's intact, fundi    benign, both eyes  Ears:    Normal TM's and external ear canals, both ears  Nose:   Nares normal, septum midline, mucosa normal, no drainage    or sinus tenderness  Throat:   Lips, mucosa, and tongue normal; teeth and gums normal  Neck:   Supple, symmetrical, trachea midline, no adenopathy;    Thyroid: no enlargement/tenderness/nodules  Back:     Symmetric, no curvature, ROM normal, no CVA tenderness  Lungs:     Clear to auscultation bilaterally, respirations unlabored  Chest Wall:    No tenderness or deformity   Heart:    Regular rate and  rhythm, S1 and S2 normal, no murmur, rub   or gallop  Breast Exam:    Deferred to mammo  Abdomen:     Soft, non-tender, bowel sounds active all four quadrants,    no masses, no organomegaly  Genitalia:    Deferred  Rectal:    Extremities:   Extremities normal, atraumatic, no cyanosis or edema  Pulses:   2+ and symmetric all extremities  Skin:   Skin color, texture, turgor normal, no rashes or lesions  Lymph nodes:   Cervical, supraclavicular, and axillary nodes normal  Neurologic:   CNII-XII intact, normal strength, sensation and reflexes    throughout          Assessment & Plan:

## 2015-03-29 NOTE — Assessment & Plan Note (Signed)
New.  This is concerning b/c pt is having L leg numbness, fatigue, tremor of L hand and 1 episode of R sided visual scotoma.  Pt is asymptomatic here in office today but concern for underlying neurologic process.  Will refer to neuro for complete evaluation and tx prn.  Reviewed supportive care and red flags that should prompt return.  Pt expressed understanding and is in agreement w/ plan.

## 2015-03-29 NOTE — Progress Notes (Signed)
Pre visit review using our clinic review tool, if applicable. No additional management support is needed unless otherwise documented below in the visit note. 

## 2015-03-29 NOTE — Assessment & Plan Note (Signed)
Pt's PE WNL.  UTD on colonoscopy, pap, mammo.  Due for DEXA- ordered.  Check labs.  Anticipatory guidance provided.

## 2015-03-29 NOTE — Patient Instructions (Signed)
Follow up in 6-8 weeks to recheck leg numbness/weakness We'll notify you of your lab results and make any changes if needed We'll call you with your neurology appt If you have another episode of visual changes and/or marked leg weakness/numbness- please call or go to the ER Drink plenty of water We'll call you with your mammogram and bone density appts Call with any questions or concerns Hang in there!!!

## 2015-04-07 ENCOUNTER — Encounter: Payer: Self-pay | Admitting: Family Medicine

## 2015-04-09 NOTE — Telephone Encounter (Signed)
Patient began Lipitor 7/22 and feels that she is having an allergic reaction.  She reports a rash on chest, face, hip, and stomach.  She denies swelling, no SOB or chest pain/pressure/tightness.  She states she took her last Lipitor Sat night and it has been calming down since.  She states she is unable to do steroid injections due to turning her face red, so declines office visit today.  She would like to know if this could be a reaction to the Lipitor- should she stop taking the medication, or is there anything else she can do to calm the rash?  She has only used Actifed which helped somewhat but not much relief.    Please advise.

## 2015-04-12 ENCOUNTER — Other Ambulatory Visit: Payer: Self-pay | Admitting: Internal Medicine

## 2015-04-30 ENCOUNTER — Telehealth: Payer: Self-pay | Admitting: Family Medicine

## 2015-04-30 NOTE — Telephone Encounter (Signed)
Called pt and LMOVM: I have no idea what medication this is in reference to.

## 2015-04-30 NOTE — Telephone Encounter (Signed)
°  Relation to KC:MKLK Call back [place]:  Reason for call: pt states you needed verification on her meds states it is 1/2 of what she is currently on Please call back with any questions

## 2015-05-01 ENCOUNTER — Encounter: Payer: Self-pay | Admitting: Family Medicine

## 2015-05-01 ENCOUNTER — Ambulatory Visit (HOSPITAL_BASED_OUTPATIENT_CLINIC_OR_DEPARTMENT_OTHER)
Admission: RE | Admit: 2015-05-01 | Discharge: 2015-05-01 | Disposition: A | Discharge status: Home / Self Care | Payer: BLUE CROSS/BLUE SHIELD | Source: Ambulatory Visit | Attending: Family Medicine | Admitting: Family Medicine

## 2015-05-01 ENCOUNTER — Ambulatory Visit (INDEPENDENT_AMBULATORY_CARE_PROVIDER_SITE_OTHER): Payer: BLUE CROSS/BLUE SHIELD | Admitting: Family Medicine

## 2015-05-01 VITALS — BP 122/80 | HR 72 | Temp 98.0°F | Resp 16 | Wt 141.1 lb

## 2015-05-01 DIAGNOSIS — R2242 Localized swelling, mass and lump, left lower limb: Secondary | ICD-10-CM | POA: Diagnosis not present

## 2015-05-01 DIAGNOSIS — M11262 Other chondrocalcinosis, left knee: Secondary | ICD-10-CM | POA: Insufficient documentation

## 2015-05-01 DIAGNOSIS — M79662 Pain in left lower leg: Secondary | ICD-10-CM

## 2015-05-01 DIAGNOSIS — M79669 Pain in unspecified lower leg: Secondary | ICD-10-CM | POA: Insufficient documentation

## 2015-05-01 DIAGNOSIS — M179 Osteoarthritis of knee, unspecified: Secondary | ICD-10-CM | POA: Diagnosis not present

## 2015-05-01 MED ORDER — MELOXICAM 15 MG PO TABS: 15.0000 mg | ORAL_TABLET | Freq: Every day | ORAL | Status: DC

## 2015-05-01 NOTE — Progress Notes (Signed)
Pre visit review using our clinic review tool, if applicable. No additional management support is needed unless otherwise documented below in the visit note. 

## 2015-05-01 NOTE — Patient Instructions (Signed)
Follow up as needed Start the Mobic once daily- take w/ food- for inflammation Stop downstairs to get [place] and xray We'll call you with your ortho appt Continue to ice Call with any questions or concerns Hang in there!!!

## 2015-05-01 NOTE — Telephone Encounter (Signed)
Spoke with pt and she advised this was not a concern of hers. FYI for you.

## 2015-05-01 NOTE — Assessment & Plan Note (Signed)
Ongoing issue for pt.  Worsening in frequency and severity.  No bony TTP but she does have TTP over L lower leg where there is a small amount of soft tissue swelling.  Start daily NSAID.  Refer to ortho for complete evaluation and tx.  Reviewed supportive care and red flags that should prompt return.  Pt expressed understanding and is in agreement w/ plan.

## 2015-05-01 NOTE — Progress Notes (Signed)
   Subjective:    Patient ID: Sylvia Stevens, female    DOB: 1951/03/02, 64 y.o.   MRN: [id number]  HPI L leg pain- pt has hx of L leg numbness and is scheduled to see Dr Posey Pronto on 9/15.  Pt reports pain 'like someone kicked me right here'- just below knee on anterior tibia.  No improvement w/ elevation, ice, heat, aleve.  Still walking 3 miles daily.  Pt reports pain will radiate up into thigh and down into foot.  Pt has small soft tissue swelling just above lateral malleolus and states that on Saturday, this was a 10/10 pain and she thought she was going to vomit.  Pain consistently hovers at 8/10.   Review of Systems For ROS see HPI     Objective:   Physical Exam  Constitutional: She is oriented to person, place, and time. She appears well-developed and well-nourished. No distress.  HENT:  Head: Normocephalic and atraumatic.  Cardiovascular: Intact distal pulses.   Musculoskeletal: She exhibits edema (small area of soft tissue swelling just superior to L lateral malleolus, TTP) and tenderness (over soft tissue swelling of L lower lateral leg but no bony tenderness just below knee where pt indicates pain is most severe).  Neurological: She is alert and oriented to person, place, and time. She has normal reflexes. No cranial nerve deficit. Coordination normal.  Skin: Skin is warm and dry. No erythema.  Psychiatric: She has a normal mood and affect. Her behavior is normal. Thought content normal.  Vitals reviewed.         Assessment & Plan:

## 2015-05-01 NOTE — Telephone Encounter (Signed)
I sent this message based on what the pt stated to me, and because the pt stated Dr. Darene Lamer had requested that she needed verification I guess I assumed that Dr. Darene Lamer would know what med it was due to the fact the pt said it and as I was typing the information in she hung up thinking I was done so I was unable to get  which medication it was.

## 2015-05-17 ENCOUNTER — Encounter: Payer: Self-pay | Admitting: Family Medicine

## 2015-05-17 ENCOUNTER — Ambulatory Visit (INDEPENDENT_AMBULATORY_CARE_PROVIDER_SITE_OTHER): Payer: BLUE CROSS/BLUE SHIELD | Admitting: Family Medicine

## 2015-05-17 VITALS — BP 120/82 | HR 63 | Temp 98.0°F | Resp 16 | Wt 142.5 lb

## 2015-05-17 DIAGNOSIS — M79662 Pain in left lower leg: Secondary | ICD-10-CM | POA: Diagnosis not present

## 2015-05-17 NOTE — Patient Instructions (Addendum)
Schedule your physical for next July If you want to join [place] at the new [place] office, any scheduled appointments will automatically transfer and we will see you at [address] [place] [address] [place], [place], [place] [address]  Keep up the good work!  You look great! Call with any questions or concerns Have a great Fall season!!

## 2015-05-17 NOTE — Assessment & Plan Note (Addendum)
Resolved w/ scheduled NSAID use.  No longer having pain, numbness.  Has been able to resume her regular exercise.  Pt cancelled ortho appt but has neuro appt still pending.  Continue Mobic at this time.  Will continue to follow.

## 2015-05-17 NOTE — Progress Notes (Signed)
Pre visit review using our clinic review tool, if applicable. No additional management support is needed unless otherwise documented below in the visit note. 

## 2015-05-17 NOTE — Progress Notes (Signed)
   Subjective:    Patient ID: Sylvia Stevens, female    DOB: May 04, 1951, 64 y.o.   MRN: [id number]  HPI Leg pain- pt reports that by day 3 of Mobic use pain was 'just about gone'.  Now able to take every other day w/ good results.  Pt cancelled ortho appt b/c of improvement.  Numbness has also improved.   Review of Systems For ROS see HPI     Objective:   Physical Exam  Constitutional: She is oriented to person, place, and time. She appears well-developed and well-nourished. No distress.  HENT:  Head: Normocephalic and atraumatic.  Cardiovascular: Intact distal pulses.   Musculoskeletal: She exhibits no edema or tenderness (no longer having TTP over lower legs).  Neurological: She is alert and oriented to person, place, and time.  Skin: Skin is warm and dry. No erythema.  Psychiatric: She has a normal mood and affect. Her behavior is normal. Thought content normal.  Vitals reviewed.         Assessment & Plan:

## 2015-05-24 ENCOUNTER — Encounter: Payer: Self-pay | Admitting: Neurology

## 2015-05-24 ENCOUNTER — Ambulatory Visit (INDEPENDENT_AMBULATORY_CARE_PROVIDER_SITE_OTHER): Payer: BLUE CROSS/BLUE SHIELD | Admitting: Neurology

## 2015-05-24 VITALS — BP 130/78 | HR 68 | Ht 63.0 in | Wt 141.2 lb

## 2015-05-24 DIAGNOSIS — R251 Tremor, unspecified: Secondary | ICD-10-CM

## 2015-05-24 DIAGNOSIS — G5732 Lesion of lateral popliteal nerve, left lower limb: Secondary | ICD-10-CM | POA: Diagnosis not present

## 2015-05-24 DIAGNOSIS — R42 Dizziness and giddiness: Secondary | ICD-10-CM | POA: Diagnosis not present

## 2015-05-24 DIAGNOSIS — H53451 Other localized visual field defect, right eye: Secondary | ICD-10-CM | POA: Diagnosis not present

## 2015-05-24 DIAGNOSIS — G252 Other specified forms of tremor: Secondary | ICD-10-CM

## 2015-05-24 DIAGNOSIS — H53411 Scotoma involving central area, right eye: Secondary | ICD-10-CM

## 2015-05-24 DIAGNOSIS — Z87898 Personal history of other specified conditions: Secondary | ICD-10-CM

## 2015-05-24 DIAGNOSIS — G5730 Lesion of lateral popliteal nerve, unspecified lower limb: Secondary | ICD-10-CM | POA: Insufficient documentation

## 2015-05-24 NOTE — Patient Instructions (Addendum)
1.  Avoid crossing your legs and see if this helps with your leg numbness 2.  US carotids 3.  Continue aspirin 81mg  daily 4.  If your balance worsens, please call my office for a return visit  We will call you with the results of your testing

## 2015-05-24 NOTE — Progress Notes (Signed)
[facility] Clinic Note - Initial Visit   Date: 05/24/2015  Sylvia Stevens MRN: [id number] DOB: 07-Aug-1951   Dear Dr. Birdie Riddle:  Thank you for your kind referral of Sylvia Stevens for consultation of left leg numbness. Although her history is well known to you, please allow [place] to reiterate it for the purpose of our medical record. The patient was accompanied to the clinic by self who also provides collateral information.     History of Present Illness: Sylvia Stevens is a 64 y.o. right-handed Caucasian female with hypothyroidism, hypertension, osteoarthritis, and osteopenia presenting for evaluation of left leg numbness.    Starting around May 2016, she began having intermittent spells of numbness and tingling of the left lower leg and top of the foot.  It is provoked when she crosses her legs and improved by stretching.  She denies any weakness, but does have difficulty climbing stairs due to knee pain. There is no numbness/tingling of the right lower leg or back pain.  No bowel/bladder problems.  She was given mobic which has significantly helped her knee pain.  Her left hand also becomes tremulous at times, which is worse at rest.  It does not occur very frequently, maybe once per month.  It lasts for a few minutes and she is usually able to control it.  No family history of tremors.   In July 2016, she had one spell "keilodoscope" over the periphery of her right upper vision, lasting an hour.  She did not close her eyes to see if it involved one eye or both.  She felt it was due to dehydration because she was working outside in the heat.  No associated headache, eye pain, or double vision.  She takes a daily aspirin.  Her cholesterol is elevated, but she is intolerant of statin therapy so is managing with diet and exercise. No further spells.  Out-side paper records, electronic medical record, and images have been reviewed where available and summarized as:  Lab Results   Component Value Date   CHOL 214* 03/29/2015   HDL 53.40 03/29/2015   LDLCALC 142* 03/29/2015   LDLDIRECT 142.9 03/25/2013   TRIG 91.0 03/29/2015   CHOLHDL 4 03/29/2015   Lab Results  Component Value Date   TSH 1.31 03/29/2015     Past Medical History  Diagnosis Date  . Endometrial polyp   . Osteopenia 09/16/2012    T score -1.3 FRAX 7.6%/0.9%  . Thyroid disease     hypothyroid  . Arthritis   . Hypertension     Past Surgical History  Procedure Laterality Date  . Tubal ligation    . Cesarean section      X 2  . Carpal tunnel release      X 2  . Bladder bx    . Appendectomy    . Hernia repair    . Toe surgery       Medications:  Outpatient Encounter Prescriptions as of 05/24/2015  Medication Sig  . aspirin 81 MG tablet Take 81 mg by mouth daily.    . cholecalciferol (VITAMIN D) 1000 UNITS tablet Take 1,000 Units by mouth daily.  . Fish Oil-Cholecalciferol (FISH OIL + D3) 1200-1000 MG-UNIT CAPS Take by mouth.    . levothyroxine (SYNTHROID, LEVOTHROID) 88 MCG tablet TAKE 1 TABLET BY MOUTH EVERY MORNING BEFORE BREAKFAST  . meloxicam (MOBIC) 15 MG tablet Take 1 tablet (15 mg total) by mouth daily.   No facility-administered encounter medications on file as of  05/24/2015.     Allergies:  Allergies  Allergen Reactions  . Atorvastatin Hives  . Lobster [Shellfish Allergy] Hives  . Penicillins Hives  . Strawberry Hives  . Erythromycin Other (See Comments)    Face feels like it is sunburned.   Yvette Rack [Cyclobenzaprine]     Vomit     Family History: Family History  Problem Relation Age of Onset  . Alzheimer's disease Mother   . Heart disease Father   . Pancreatic cancer Father   . Arthritis Father   . Hyperlipidemia Father   . Hypertension Father   . Cancer Paternal Aunt     Ovarian or Uterine cancer  . Aneurysm Paternal Uncle     Social History: Social History  Substance Use Topics  . Smoking status: Current Every Day Smoker -- 0.50 packs/day     Types: Cigarettes  . Smokeless tobacco: Never Used  . Alcohol Use: 0.0 oz/week    0 Standard drinks or equivalent per week     Comment: rare   Social History   Social History Narrative   Lives with husband in a one story home.  Has 2 children.  Works part time as an Biochemist, clinical.  Education: some college.    Review of Systems:  CONSTITUTIONAL: No fevers, chills, night sweats, or weight loss.   EYES: No visual changes or eye pain ENT: No hearing changes.  No history of nose bleeds.   RESPIRATORY: No cough, wheezing and shortness of breath.   CARDIOVASCULAR: Negative for chest pain, and palpitations.   GI: Negative for abdominal discomfort, blood in stools or black stools.  No recent change in bowel habits.   GU:  No history of incontinence.   MUSCLOSKELETAL: +history of joint pain or swelling.  No myalgias.   SKIN: Negative for lesions, rash, and itching.   HEMATOLOGY/ONCOLOGY: Negative for prolonged bleeding, bruising easily, and swollen nodes.  No history of cancer.   ENDOCRINE: Negative for cold or heat intolerance, polydipsia or goiter.   PSYCH:  No depression or anxiety symptoms.   NEURO: As Above.   Vital Signs:  BP 130/78 mmHg  Pulse 68  Ht '5\' 3"'  (1.6 m)  Wt 141 lb 3 oz (64.042 kg)  BMI 25.02 kg/m2  SpO2 99%   General Medical Exam:   General:  Well appearing, comfortable.   Eyes/ENT: see cranial nerve examination.   Neck: No masses appreciated.  Full range of motion without tenderness.  No carotid bruits. Respiratory:  Clear to auscultation, good air entry bilaterally.   Cardiac:  Regular rate and rhythm, no murmur.   Extremities:  No deformities, edema, or skin discoloration.  Skin:  No rashes or lesions.  Neurological Exam: MENTAL STATUS including orientation to time, place, person, recent and remote memory, attention span and concentration, language, and fund of knowledge is normal.  Speech is not dysarthric.  CRANIAL NERVES: II:  No visual field  defects.  Unremarkable fundi.   III-IV-VI: Pupils equal round and reactive to light.  Normal conjugate, extra-ocular eye movements in all directions of gaze.  No nystagmus.  No ptosis.   V:  Normal facial sensation.     VII:  Normal facial symmetry and movements.  No pathologic facial reflexes.  VIII:  Normal hearing and vestibular function.   IX-X:  Normal palatal movement.   XI:  Normal shoulder shrug and head rotation.   XII:  Normal tongue strength and range of motion, no deviation or fasciculation.  MOTOR:  No atrophy,  fasciculations or abnormal movements.  No pronator drift.  Tone is normal.    Right Upper Extremity:    Left Upper Extremity:    Deltoid  5/5   Deltoid  5/5   Biceps  5/5   Biceps  5/5   Triceps  5/5   Triceps  5/5   Wrist extensors  5/5   Wrist extensors  5/5   Wrist flexors  5/5   Wrist flexors  5/5   Finger extensors  5/5   Finger extensors  5/5   Finger flexors  5/5   Finger flexors  5/5   Dorsal interossei  5/5   Dorsal interossei  5/5   Abductor pollicis  5/5   Abductor pollicis  5/5   Tone (Ashworth scale)  0  Tone (Ashworth scale)  0   Right Lower Extremity:    Left Lower Extremity:    Hip flexors  5/5   Hip flexors  5/5   Hip extensors  5/5   Hip extensors  5/5   Knee flexors  5/5   Knee flexors  5/5   Knee extensors  5/5   Knee extensors  5/5   Dorsiflexors  5/5   Dorsiflexors  5/5   Plantarflexors  5/5   Plantarflexors  5/5   Toe extensors  5/5   Toe extensors  5/5   Toe flexors  5/5   Toe flexors  5/5   Tone (Ashworth scale)  0  Tone (Ashworth scale)  0   MSRs:  Right                                                                 Left brachioradialis 2+  brachioradialis 2+  biceps 2+  biceps 2+  triceps 2+  triceps 2+  patellar 2+  patellar 2+  ankle jerk 2+  ankle jerk 2+  Hoffman no  Hoffman no  plantar response down  plantar response down   SENSORY:  Normal and symmetric perception of light touch, pinprick, vibration, and proprioception.   Romberg's sign absent.   COORDINATION/GAIT: Normal finger-to- nose-finger and heel-to-shin.  Intact rapid alternating movements bilaterally.  Able to rise from a chair without using arms.  Gait narrow based and stable. Mild unsteadiness with tandem gait.  Stressed gait intact.    IMPRESSION: 1.  Left superficial peroneal mononeuropathy, mild and improving.  Less likely L5 radiculopathy.   - Avoid crossing legs to minimize compression of the left  - Because symptoms are improving, no need to do additional testing  2.  Transient visual field impairment, TIA cannot be excluded so will obtain US carotid   - Continue aspirin 31m daily  - Encouraged to stay active as her LDL is elevated, but she is intolerant of statins  3.  Tremors are not apparent on today's exam, if worsens she will call our office for further evaluation.  No parkinsonian signs on exam.  4.  Gait unsteadiness, likely due to knee pain and lumbosacral degenerative disease.  If there is worsening, imaging and/or balance therapy can be done.  I encouraged her to start tai chi, yoga, or water exercises.  Return to clinic as needed   The duration of this appointment visit was 40 minutes of face-to-face time with the patient.  Greater than 50% of this time was spent in counseling, explanation of diagnosis, planning of further management, and coordination of care.   Thank you for allowing me to participate in patient's care.  If I can answer any additional questions, I would be pleased to do so.    Sincerely,    Coraline Talwar K. Posey Pronto, DO

## 2015-05-29 ENCOUNTER — Ambulatory Visit (HOSPITAL_COMMUNITY)
Admission: RE | Admit: 2015-05-29 | Discharge: 2015-05-29 | Disposition: A | Discharge status: Home / Self Care | Payer: BLUE CROSS/BLUE SHIELD | Source: Ambulatory Visit | Attending: Neurology | Admitting: Neurology

## 2015-05-29 DIAGNOSIS — I6523 Occlusion and stenosis of bilateral carotid arteries: Secondary | ICD-10-CM | POA: Diagnosis not present

## 2015-05-29 DIAGNOSIS — G5732 Lesion of lateral popliteal nerve, left lower limb: Secondary | ICD-10-CM

## 2015-05-29 DIAGNOSIS — I10 Essential (primary) hypertension: Secondary | ICD-10-CM | POA: Insufficient documentation

## 2015-05-29 DIAGNOSIS — H53451 Other localized visual field defect, right eye: Secondary | ICD-10-CM | POA: Insufficient documentation

## 2015-05-29 DIAGNOSIS — F17200 Nicotine dependence, unspecified, uncomplicated: Secondary | ICD-10-CM | POA: Diagnosis not present

## 2015-05-29 DIAGNOSIS — H53411 Scotoma involving central area, right eye: Secondary | ICD-10-CM

## 2015-05-29 DIAGNOSIS — R42 Dizziness and giddiness: Secondary | ICD-10-CM | POA: Diagnosis present

## 2015-07-12 LAB — HM DEXA SCAN

## 2015-07-12 LAB — HM MAMMOGRAPHY

## 2015-07-17 ENCOUNTER — Encounter: Payer: Self-pay | Admitting: General Practice

## 2015-07-30 ENCOUNTER — Encounter: Payer: Self-pay | Admitting: General Practice

## 2015-08-07 ENCOUNTER — Other Ambulatory Visit: Payer: Self-pay | Admitting: Internal Medicine

## 2015-08-13 ENCOUNTER — Encounter: Payer: Self-pay | Admitting: Family Medicine

## 2015-08-16 ENCOUNTER — Encounter: Payer: Self-pay | Admitting: Family Medicine

## 2015-08-16 ENCOUNTER — Ambulatory Visit (INDEPENDENT_AMBULATORY_CARE_PROVIDER_SITE_OTHER): Payer: 59 | Admitting: Family Medicine

## 2015-08-16 VITALS — BP 130/78 | HR 71 | Temp 98.1°F | Resp 16 | Ht 63.0 in | Wt 140.2 lb

## 2015-08-16 DIAGNOSIS — R21 Rash and other nonspecific skin eruption: Secondary | ICD-10-CM | POA: Diagnosis not present

## 2015-08-16 MED ORDER — TRIAMCINOLONE ACETONIDE 0.1 % EX OINT: 1.0000 "application " | TOPICAL_OINTMENT | Freq: Two times a day (BID) | CUTANEOUS | Status: DC

## 2015-08-16 NOTE — Progress Notes (Signed)
   Subjective:    Patient ID: Sylvia Stevens, female    DOB: 06-13-1951, 64 y.o.   MRN: [id number]  HPI Rash- first noticed 7-10 days ago.  Area has increased in size and severity of itching.  Some burning.  No obvious blisters.  Very dry scaly skin.     Review of Systems For ROS see HPI     Objective:   Physical Exam  Constitutional: She is oriented to person, place, and time. She appears well-developed and well-nourished. No distress.  HENT:  Head: Normocephalic and atraumatic.  Neurological: She is alert and oriented to person, place, and time.  Skin: Skin is warm and dry. There is erythema (L posterior flank near bra line w/ scaling skin, excoriations, and possible resolving vesicles).  Psychiatric: She has a normal mood and affect. Her behavior is normal. Thought content normal.  Vitals reviewed.         Assessment & Plan:

## 2015-08-16 NOTE — Progress Notes (Signed)
Pre visit review using our clinic review tool, if applicable. No additional management support is needed unless otherwise documented below in the visit note. 

## 2015-08-16 NOTE — Patient Instructions (Signed)
Follow up as scheduled Start the Triamcinolone ointment twice daily Call with any questions or concerns If you want to join [place] at the new [place] office, any scheduled appointments will automatically transfer and we will see you at [address] [place] [address] [place], [place] [address] (OPENING 09/11/15) Happy Holidays!!

## 2015-08-16 NOTE — Assessment & Plan Note (Signed)
New.  Unclear if this area represents severe eczema patch or mild case of shingles.  Since sxs started more than 3+ days ago, Valtrex is not necessary at this time.  Start triamcinolone ointment twice daily.  Reviewed supportive care and red flags that should prompt return.  Pt expressed understanding and is in agreement w/ plan.

## 2015-12-21 ENCOUNTER — Ambulatory Visit: Payer: BLUE CROSS/BLUE SHIELD | Admitting: Internal Medicine

## 2015-12-26 ENCOUNTER — Other Ambulatory Visit (INDEPENDENT_AMBULATORY_CARE_PROVIDER_SITE_OTHER): Payer: BLUE CROSS/BLUE SHIELD

## 2015-12-26 ENCOUNTER — Encounter: Payer: Self-pay | Admitting: Internal Medicine

## 2015-12-26 ENCOUNTER — Ambulatory Visit (INDEPENDENT_AMBULATORY_CARE_PROVIDER_SITE_OTHER): Payer: Self-pay | Admitting: Internal Medicine

## 2015-12-26 VITALS — BP 124/68 | HR 79 | Temp 98.4°F | Resp 12 | Wt 138.0 lb

## 2015-12-26 DIAGNOSIS — E039 Hypothyroidism, unspecified: Secondary | ICD-10-CM | POA: Diagnosis not present

## 2015-12-26 DIAGNOSIS — E785 Hyperlipidemia, unspecified: Secondary | ICD-10-CM

## 2015-12-26 LAB — HEPATIC FUNCTION PANEL
ALK PHOS: 54 U/L (ref 39–117)
ALT: 9 U/L (ref 0–35)
AST: 15 U/L (ref 0–37)
Albumin: 4.1 g/dL (ref 3.5–5.2)
BILIRUBIN DIRECT: 0.1 mg/dL (ref 0.0–0.3)
BILIRUBIN TOTAL: 0.5 mg/dL (ref 0.2–1.2)
Total Protein: 7 g/dL (ref 6.0–8.3)

## 2015-12-26 LAB — TSH: TSH: 1.41 u[IU]/mL (ref 0.35–4.50)

## 2015-12-26 LAB — T4, FREE: FREE T4: 1.13 ng/dL (ref 0.60–1.60)

## 2015-12-26 NOTE — Progress Notes (Signed)
Subjective:     Patient ID: Sylvia Stevens, female   DOB: [birth year], 65 y.o.   MRN: [id number]  HPI Sylvia Stevens is a 65 y.o.woman, returning for f/u for well controlled hypothyroidism, dx 2008. Last visit 1 year ago.  Reviewed thyroid tests: Lab Results  Component Value Date   TSH 1.31 03/29/2015   TSH 2.61 12/22/2014   TSH 0.74 03/27/2014   TSH 1.32 12/01/2013   TSH 0.66 06/03/2013   TSH 0.91 03/25/2013   TSH 0.65 02/04/2013   TSH 0.21* 12/27/2012   TSH 0.24* 11/16/2012   TSH 0.26* 08/18/2012   FREET4 0.98 12/22/2014   FREET4 1.06 12/01/2013   FREET4 1.11 06/03/2013   FREET4 1.11 02/04/2013   FREET4 1.51 11/16/2012   FREET4 1.38 08/18/2012   She takes Levothyroxine generic 88 mcg correctly: - in am - with tea - eats b'fast >1h later - no Ca, MVI - occasionally PPI - later in the day  She denies: - fatigue - constipation - dry skin - hair loss - palpitations  She has L hand tremor.   She has a history of hypertension - lately resolved after eliminating the mold in her house, osteopenia, vitamin D deficiency, tobacco use, OA, bursitis.   I reviewed pt's medications, allergies, PMH, social hx, family hx, and changes were documented in the history of present illness. Otherwise, unchanged from my initial visit note.   Review of Systems Constitutional: see HPI Eyes: no blurry vision, no xerophthalmia ENT: + sore throat, no nodules palpated in throat, no dysphagia/odynophagia, + hoarseness;  Cardiovascular: no CP/SOB/palpitations/leg swelling Respiratory: no cough/no SOB Gastrointestinal: no N/V/D/C Musculoskeletal: no muscle/+ joint aches - knees Skin: no rashes Neurological: + L hand tremors/numbness/tingling/dizziness, but has occasional vertigo  Objective:   Physical Exam BP 124/68 mmHg  Pulse 79  Temp(Src) 98.4 F (36.9 C) (Oral)  Resp 12  Wt 138 lb (62.596 kg)  SpO2 94% Body mass index is 24.45 kg/(m^2). Wt Readings from Last 3 Encounters:  12/26/15 138  lb (62.596 kg)  08/16/15 140 lb 4 oz (63.617 kg)  05/24/15 141 lb 3 oz (64.042 kg)   Constitutional: normal weight, in NAD Eyes: PERRLA, EOMI, no exophthalmos ENT: moist mucous membranes, no thyromegaly, + upper cervical LAD B  Cardiovascular: RRR, No MRG Respiratory: CTA B Gastrointestinal: abdomen soft, NT, ND, BS+ Musculoskeletal: no deformities, strength intact in all 4 Skin: dry, no rashes Neurological: no tremor with outstretched hands  Assessment:     1. Hypothyroidism  - on stable dose of levothyroxine generic since diagnosis    Plan:     Patient has long-standing hypothyroidism, with stable TSH levels, on 88 mcg of Levothyroxine daily - She appears euthyroid - Continue the same dose of levothyroxine, 88 g daily. - we discussed about taking Levothyroxine correctly: every day, with water, at least 30 minutes before breakfast, separated by at least 4 hours from: - acid reflux medications - calcium - iron - multivitamins She is taking it correctly. - I can feel no nodules in her thyroid at palpation. She does not have neck compression symptoms. - repeat labs today: TSH and fT4 - I will call her with the results of her next labs, and see if we need to change her Synthroid dose - I will see her back for another appointment in 1 year  Office Visit on 12/26/2015  Component Date Value Ref Range Status  . TSH 12/26/2015 1.41  0.35 - 4.50 uIU/mL Final  . Free T4 12/26/2015 1.13  0.60 - 1.60 ng/dL Final    Labs are great!

## 2015-12-26 NOTE — Patient Instructions (Signed)
Please stop at the lab.  Continue Levothyroxine 88 mcg daily.  Let me know if you need a refill.  Please return in 1 year.

## 2016-02-13 ENCOUNTER — Other Ambulatory Visit: Payer: Self-pay | Admitting: Internal Medicine

## 2016-04-03 ENCOUNTER — Encounter: Payer: 59 | Admitting: Family Medicine

## 2016-04-25 ENCOUNTER — Encounter: Payer: Self-pay | Admitting: Family Medicine

## 2016-04-25 ENCOUNTER — Ambulatory Visit (INDEPENDENT_AMBULATORY_CARE_PROVIDER_SITE_OTHER): Payer: BLUE CROSS/BLUE SHIELD | Admitting: Family Medicine

## 2016-04-25 VITALS — BP 123/73 | HR 67 | Temp 99.0°F | Resp 17 | Ht 63.0 in | Wt 136.5 lb

## 2016-04-25 DIAGNOSIS — Z0000 Encounter for general adult medical examination without abnormal findings: Secondary | ICD-10-CM | POA: Diagnosis not present

## 2016-04-25 LAB — CBC WITH DIFFERENTIAL/PLATELET
BASOS ABS: 0 10*3/uL (ref 0.0–0.1)
BASOS PCT: 0.5 % (ref 0.0–3.0)
Eosinophils Absolute: 0.1 10*3/uL (ref 0.0–0.7)
Eosinophils Relative: 1.4 % (ref 0.0–5.0)
HEMATOCRIT: 42.2 % (ref 36.0–46.0)
Hemoglobin: 14.1 g/dL (ref 12.0–15.0)
LYMPHS PCT: 27.2 % (ref 12.0–46.0)
Lymphs Abs: 1.6 10*3/uL (ref 0.7–4.0)
MCHC: 33.3 g/dL (ref 30.0–36.0)
MCV: 94.7 fl (ref 78.0–100.0)
MONOS PCT: 6.3 % (ref 3.0–12.0)
Monocytes Absolute: 0.4 10*3/uL (ref 0.1–1.0)
NEUTROS ABS: 3.7 10*3/uL (ref 1.4–7.7)
Neutrophils Relative %: 64.6 % (ref 43.0–77.0)
PLATELETS: 236 10*3/uL (ref 150.0–400.0)
RBC: 4.46 Mil/uL (ref 3.87–5.11)
RDW: 14.1 % (ref 11.5–15.5)
WBC: 5.8 10*3/uL (ref 4.0–10.5)

## 2016-04-25 LAB — HEPATIC FUNCTION PANEL
ALT: 14 U/L (ref 0–35)
AST: 19 U/L (ref 0–37)
Albumin: 4.4 g/dL (ref 3.5–5.2)
Alkaline Phosphatase: 56 U/L (ref 39–117)
BILIRUBIN DIRECT: 0.1 mg/dL (ref 0.0–0.3)
BILIRUBIN TOTAL: 0.5 mg/dL (ref 0.2–1.2)
TOTAL PROTEIN: 6.7 g/dL (ref 6.0–8.3)

## 2016-04-25 LAB — LIPID PANEL
Cholesterol: 205 mg/dL — ABNORMAL HIGH (ref 0–200)
HDL: 57.7 mg/dL (ref >39.00)
LDL CALC: 128 mg/dL — AB (ref 0–99)
NONHDL: 146.95
Total CHOL/HDL Ratio: 4
Triglycerides: 96 mg/dL (ref 0.0–149.0)
VLDL: 19.2 mg/dL (ref 0.0–40.0)

## 2016-04-25 LAB — BASIC METABOLIC PANEL
BUN: 12 mg/dL (ref 6–23)
CALCIUM: 10 mg/dL (ref 8.4–10.5)
CO2: 31 mEq/L (ref 19–32)
Chloride: 107 mEq/L (ref 96–112)
Creatinine, Ser: 0.96 mg/dL (ref 0.40–1.20)
GFR: 62.1 mL/min (ref >60.00)
Glucose, Bld: 89 mg/dL (ref 70–99)
Potassium: 5.2 mEq/L — ABNORMAL HIGH (ref 3.5–5.1)
SODIUM: 144 meq/L (ref 135–145)

## 2016-04-25 LAB — TSH: TSH: 2.07 u[IU]/mL (ref 0.35–4.50)

## 2016-04-25 LAB — VITAMIN D 25 HYDROXY (VIT D DEFICIENCY, FRACTURES): VITD: 37.63 ng/mL (ref 30.00–100.00)

## 2016-04-25 NOTE — Assessment & Plan Note (Signed)
Pt's PE WNL.  UTD on pap, mammo, colonoscopy, immunizations.  Check labs.  Anticipatory guidance provided.  

## 2016-04-25 NOTE — Progress Notes (Signed)
Pre visit review using our clinic review tool, if applicable. No additional management support is needed unless otherwise documented below in the visit note. 

## 2016-04-25 NOTE — Patient Instructions (Signed)
Follow up in 1 year or as needed We'll notify you of your lab results and make any changes if needed Continue to work on healthy diet and regular exercise- you look great! Try and quit smoking- you can do it! You are due for mammo in November, pap next year Call with any questions or concerns Happy Labor Day!!!

## 2016-04-25 NOTE — Progress Notes (Signed)
   Subjective:    Patient ID: Sylvia Stevens, female    DOB: [birth year], 65 y.o.   MRN: [id number]  HPI CPE- UTD on mammo (due November), colonoscopy (due 2023), pap smear (due 2018).  Exercising regularly.   Review of Systems Patient reports no vision/ hearing changes, adenopathy,fever, weight change,  persistant/recurrent hoarseness , swallowing issues, chest pain, palpitations, edema, persistant/recurrent cough, hemoptysis, dyspnea (rest/exertional/paroxysmal nocturnal), gastrointestinal bleeding (melena, rectal bleeding), abdominal pain, significant heartburn, bowel changes, GU symptoms (dysuria, hematuria, incontinence), Gyn symptoms (abnormal  bleeding, pain),  syncope, focal weakness, memory loss, numbness & tingling, skin/hair/nail changes, abnormal bruising or bleeding, anxiety, or depression.     Objective:   Physical Exam General Appearance:    Alert, cooperative, no distress, appears stated age  Head:    Normocephalic, without obvious abnormality, atraumatic  Eyes:    PERRL, conjunctiva/corneas clear, EOM's intact, fundi    benign, both eyes  Ears:    Normal TM's and external ear canals, both ears  Nose:   Nares normal, septum midline, mucosa normal, no drainage    or sinus tenderness  Throat:   Lips, mucosa, and tongue normal; teeth and gums normal  Neck:   Supple, symmetrical, trachea midline, no adenopathy;    Thyroid: no enlargement/tenderness/nodules  Back:     Symmetric, no curvature, ROM normal, no CVA tenderness  Lungs:     Clear to auscultation bilaterally, respirations unlabored  Chest Wall:    No tenderness or deformity   Heart:    Regular rate and rhythm, S1 and S2 normal, no murmur, rub   or gallop  Breast Exam:    Deferred to mammo  Abdomen:     Soft, non-tender, bowel sounds active all four quadrants,    no masses, no organomegaly  Genitalia:    Deferred  Rectal:    Extremities:   Extremities normal, atraumatic, no cyanosis or edema  Pulses:   2+ and symmetric  all extremities  Skin:   Skin color, texture, turgor normal, no rashes or lesions  Lymph nodes:   Cervical, supraclavicular, and axillary nodes normal  Neurologic:   CNII-XII intact, normal strength, sensation and reflexes    throughout          Assessment & Plan:

## 2016-05-27 ENCOUNTER — Other Ambulatory Visit: Payer: Self-pay | Admitting: Internal Medicine

## 2016-08-15 IMAGING — CR DG TIBIA/FIBULA 2V*L*
4 series · 4 of 4 positions shown · non-contrast
Comparison: None.

CLINICAL DATA: Left tibial pain with the area of palpable concern
in the distal lateral side.

EXAM:
LEFT TIBIA AND FIBULA - 2 VIEW

[t tib/fib ap left (1 of 2)]
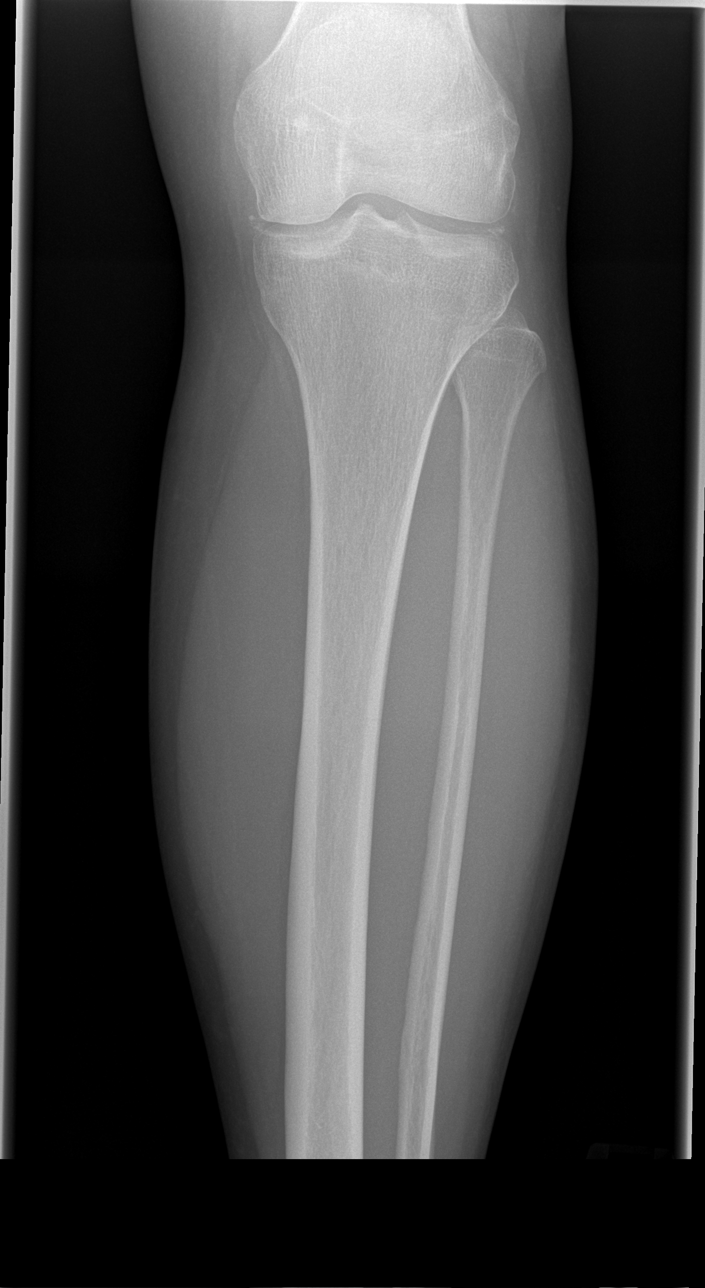

[t tib/fib ap left (2 of 2)]
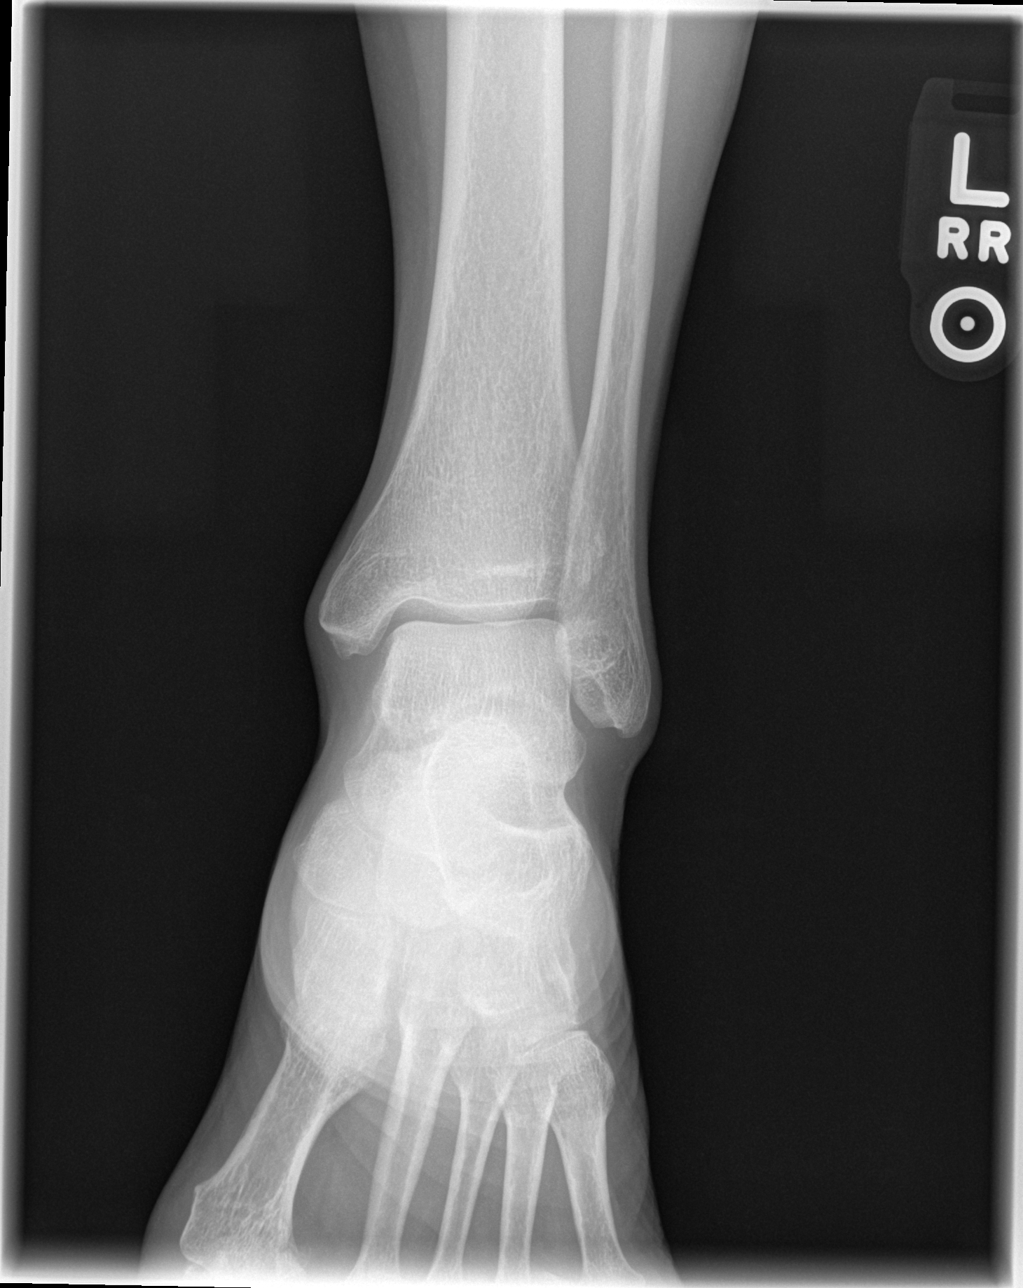

[t tib/fib lat left (1 of 2)]
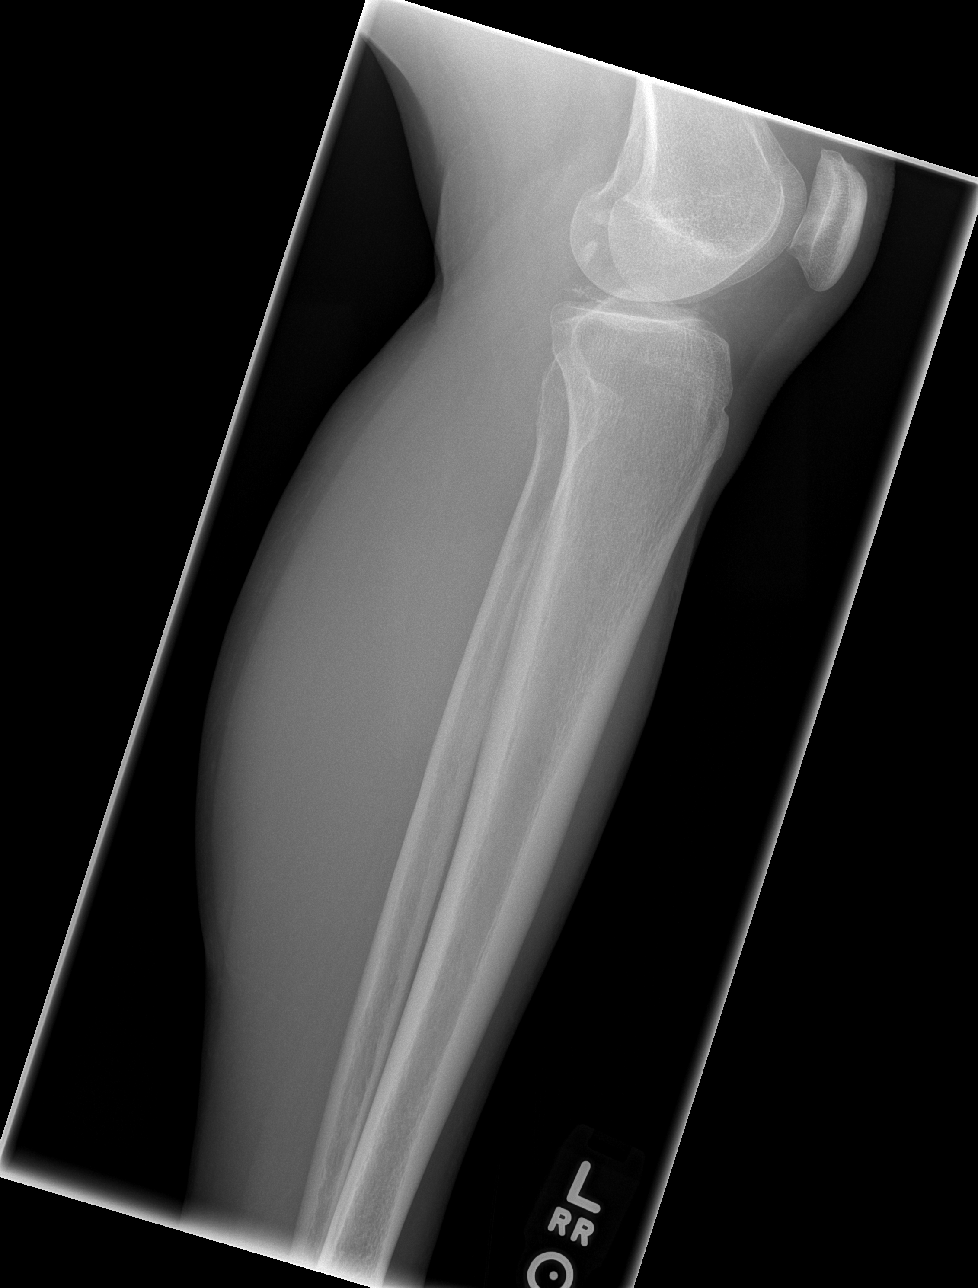

[t tib/fib lat left (2 of 2)]
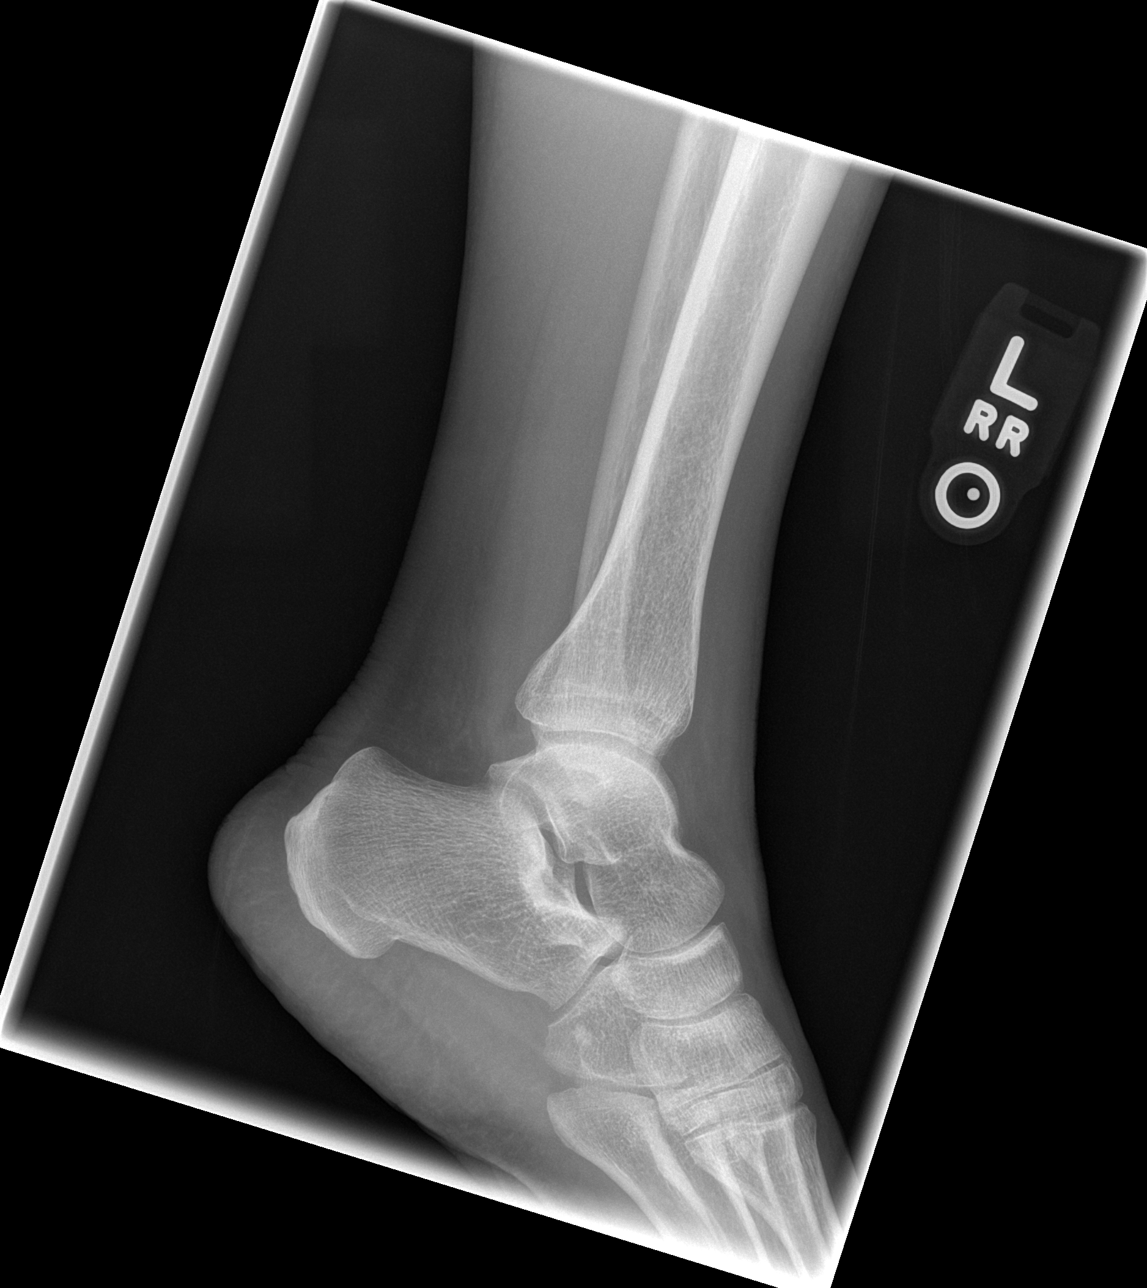

[4 of 4 positions shown; findings below may reference images not displayed]

FINDINGS: No evidence of acute fracture or subluxation. No focal bone lesion
or bone destruction. Bone cortex and trabecular architecture appear
intact. Mild osteoarthritic changes of the knee joint with formation
of chondrocalcinosis within the medial and lateral compartments are
noted. No radiopaque soft tissue foreign bodies.
IMPRESSION: No acute osseous abnormality identified.

Mild osteoarthritic changes of the knee.

## 2016-08-18 ENCOUNTER — Other Ambulatory Visit: Payer: Self-pay | Admitting: Internal Medicine

## 2016-10-29 ENCOUNTER — Encounter: Payer: Self-pay | Admitting: *Deleted

## 2016-11-17 ENCOUNTER — Other Ambulatory Visit: Payer: Self-pay | Admitting: Internal Medicine

## 2016-12-25 ENCOUNTER — Encounter: Payer: Self-pay | Admitting: Internal Medicine

## 2016-12-25 ENCOUNTER — Ambulatory Visit (INDEPENDENT_AMBULATORY_CARE_PROVIDER_SITE_OTHER): Payer: PPO | Admitting: Internal Medicine

## 2016-12-25 VITALS — BP 148/78 | HR 65 | Temp 98.1°F | Wt 140.0 lb

## 2016-12-25 DIAGNOSIS — E039 Hypothyroidism, unspecified: Secondary | ICD-10-CM | POA: Diagnosis not present

## 2016-12-25 DIAGNOSIS — R5383 Other fatigue: Secondary | ICD-10-CM | POA: Diagnosis not present

## 2016-12-25 LAB — T4, FREE: FREE T4: 1.15 ng/dL (ref 0.60–1.60)

## 2016-12-25 LAB — TSH: TSH: 2.53 u[IU]/mL (ref 0.35–4.50)

## 2016-12-25 LAB — VITAMIN B12: VITAMIN B 12: 214 pg/mL (ref 211–911)

## 2016-12-25 NOTE — Progress Notes (Signed)
Subjective:     Patient ID: Sylvia Stevens, female   DOB: 1951-01-29, 66 y.o.   MRN: [id number]  HPI Ms Sylvia Stevens is a 66 y.o.woman, returning for f/u for well controlled hypothyroidism, dx 2008. Last visit was 1 year ago.  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  She is more fatigue.  Reviewed thyroid tests: Lab Results  Component Value Date   TSH 2.07 04/25/2016   TSH 1.41 12/26/2015   TSH 1.31 03/29/2015   TSH 2.61 12/22/2014   TSH 0.74 03/27/2014   TSH 1.32 12/01/2013   TSH 0.66 06/03/2013   TSH 0.91 03/25/2013   TSH 0.65 02/04/2013   TSH 0.21 (L) 12/27/2012   FREET4 1.13 12/26/2015   FREET4 0.98 12/22/2014   FREET4 1.06 12/01/2013   FREET4 1.11 06/03/2013   FREET4 1.11 02/04/2013   FREET4 1.51 11/16/2012   FREET4 1.38 08/18/2012   She takes Levothyroxine generic 88 mcg: - In a.m. - Fasting - With tea - Eats breakfast more than 1 hour later - started calcium >> at dinnertime - no MVI+ - She occasionally takes PPIs, but later in the day - now very rarely  She c/o - + fatigue  Denies: - constipation - dry skin - hair loss - palpitations  She has L hand tremor, which is chronic.- occasionally.  She has a history of hypertension, osteopenia, vitamin D deficiency, tobacco use, OA, bursitis.   I reviewed pt's medications, allergies, PMH, social hx, family hx, and changes were documented in the history of present illness. Otherwise, unchanged from my initial visit note.   Review of Systems Constitutional: see HPI Eyes: no blurry vision, no xerophthalmia ENT: no sore throat, no nodules palpated in throat, no dysphagia/odynophagia, no hoarseness;  Cardiovascular: no CP/SOB/palpitations/leg swelling Respiratory: no cough/no SOB Gastrointestinal: no N/V/D/C Musculoskeletal: no muscle/joint aches  Skin: no rashes Neurological: +Chronic L hand tremors - occasionally/numbness/tingling/dizziness  Objective:   Physical Exam BP (!) 148/78 (BP  Location: Right Arm, Patient Position: Sitting, Cuff Size: Normal)   Pulse 65   Temp 98.1 F (36.7 C) (Oral)   Wt 140 lb (63.5 kg)   SpO2 98%   BMI 24.80 kg/m  Body mass index is 24.8 kg/m. Wt Readings from Last 3 Encounters:  04/25/16 136 lb 8 oz (61.9 kg)  12/26/15 138 lb (62.6 kg)  08/16/15 140 lb 4 oz (63.6 kg)   Constitutional: normal weight, in NAD Eyes: PERRLA, EOMI, no exophthalmos ENT: moist mucous membranes, no thyromegaly, + R upper cervical LAD - 1 enlarged LN Cardiovascular: RRR, No MRG Respiratory: CTA B Gastrointestinal: abdomen soft, NT, ND, BS+ Musculoskeletal: no deformities, strength intact in all 4 Skin: dry, no rashes Neurological: no tremor with outstretched hands  Assessment:     1. Hypothyroidism  - on stable dose of levothyroxine generic since diagnosis  2. Fatigue    Plan:     1. Patient with a long history of controlled hypothyroidism, on 88 g of levothyroxine daily. - Today, she appears euthyroid and has no complaints - She also does not have any dysphagia, shortness of breath with lying down, hoarseness - We'll check her TFTs today to see if we can continue with the same dose of levothyroxine - We discussed about taking levothyroxine correctly: Every day, with water, at least 30 minutes before breakfast, separated by at least 4 hours from acid reflux medications, calcium, iron, multivitamins. She is taking it correctly. - She needs a new refill of levothyroxine  -  If labs are normal, I will see her back in one year. If labs are abnormal and we end up changing the dose, we will need labs in 5-6 weeks from now.  2. Fatigue - will check B12 vitamin per her request  Component     Latest Ref Rng & Units 12/25/2016  TSH     0.35 - 4.50 uIU/mL 2.53  T4,Free(Direct)     0.60 - 1.60 ng/dL 1.15  Vitamin B12     211 - 911 pg/mL 214  TFTs normal. Vitamin D is quite low >> will advise to start 5000 mcg daily and f/u with PCP.  Sylvia Kingdom,  MD PhD [facility]

## 2016-12-25 NOTE — Patient Instructions (Signed)
Please stop at the lab.  Continue Levothyroxine 88 mcg daily.  Take the thyroid hormone every day, with water, at least 30 minutes before breakfast, separated by at least 4 hours from: - acid reflux medications - calcium - iron - multivitamins  Please return in 1 year.  

## 2017-02-18 ENCOUNTER — Other Ambulatory Visit: Payer: Self-pay | Admitting: Internal Medicine

## 2017-02-18 ENCOUNTER — Encounter: Payer: Self-pay | Admitting: Physician Assistant

## 2017-02-18 ENCOUNTER — Ambulatory Visit (INDEPENDENT_AMBULATORY_CARE_PROVIDER_SITE_OTHER): Payer: PPO | Admitting: Physician Assistant

## 2017-02-18 VITALS — BP 118/70 | HR 77 | Temp 99.2°F | Resp 14 | Ht 63.0 in | Wt 136.0 lb

## 2017-02-18 DIAGNOSIS — J208 Acute bronchitis due to other specified organisms: Secondary | ICD-10-CM | POA: Diagnosis not present

## 2017-02-18 DIAGNOSIS — B9689 Other specified bacterial agents as the cause of diseases classified elsewhere: Secondary | ICD-10-CM

## 2017-02-18 MED ORDER — DOXYCYCLINE HYCLATE 100 MG PO CAPS: 100.0000 mg | ORAL_CAPSULE | Freq: Two times a day (BID) | ORAL | 0 refills | Status: DC

## 2017-02-18 MED ORDER — BENZONATATE 100 MG PO CAPS: 100.0000 mg | ORAL_CAPSULE | Freq: Two times a day (BID) | ORAL | 0 refills | Status: DC | PRN

## 2017-02-18 NOTE — Patient Instructions (Signed)
Take antibiotic (Doxycycline) as directed.  Increase fluids.  Get plenty of rest. Use Mucinex for congestion. Tessalon for evening cough. Take a daily probiotic (I recommend Align or Culturelle, but even Activia Yogurt may be beneficial).  A humidifier placed in the bedroom may offer some relief for a dry, scratchy throat of nasal irritation.  Read information below on acute bronchitis. Please call or return to clinic if symptoms are not improving.  Acute Bronchitis Bronchitis is when the airways that extend from the windpipe into the lungs get red, puffy, and painful (inflamed). Bronchitis often causes thick spit (mucus) to develop. This leads to a cough. A cough is the most common symptom of bronchitis. In acute bronchitis, the condition usually begins suddenly and goes away over time (usually in 2 weeks). Smoking, allergies, and asthma can make bronchitis worse. Repeated episodes of bronchitis may cause more lung problems.  HOME CARE  Rest.  Drink enough fluids to keep your pee (urine) clear or pale yellow (unless you need to limit fluids as told by your doctor).  Only take over-the-counter or prescription medicines as told by your doctor.  Avoid smoking and secondhand smoke. These can make bronchitis worse. If you are a smoker, think about using nicotine gum or skin patches. Quitting smoking will help your lungs heal faster.  Reduce the chance of getting bronchitis again by:  Washing your hands often.  Avoiding people with cold symptoms.  Trying not to touch your hands to your mouth, nose, or eyes.  Follow up with your doctor as told.  GET HELP IF: Your symptoms do not improve after 1 week of treatment. Symptoms include:  Cough.  Fever.  Coughing up thick spit.  Body aches.  Chest congestion.  Chills.  Shortness of breath.  Sore throat.  GET HELP RIGHT AWAY IF:   You have an increased fever.  You have chills.  You have severe shortness of breath.  You have  bloody thick spit (sputum).  You throw up (vomit) often.  You lose too much body fluid (dehydration).  You have a severe headache.  You faint.  MAKE SURE YOU:   Understand these instructions.  Will watch your condition.  Will get help right away if you are not doing well or get worse. Document Released: 02/11/2008 Document Revised: 04/27/2013 Document Reviewed: 02/15/2013 [facility] Patient Information 2015 [place], [place]. This information is not intended to replace advice given to you by your health care provider. Make sure you discuss any questions you have with your health care provider.

## 2017-02-18 NOTE — Progress Notes (Signed)
Patient presents to clinic today c/o > 1 week of scratchy throat, and progressively worsening dry cough. Associated symptoms have included low-grade fever with significant PND. Denies sinus pain, ear pain or tooth pain. Denies history of asthma. Is a smoker. Denies recent travel. Husband had similar symptoms starting at the same time but resolved in a couple of days. Has taken Aleve for temperature and Delsym for cough.   Past Medical History:  Diagnosis Date  . Arthritis   . Endometrial polyp   . Hypertension   . Osteopenia 09/16/2012   T score -1.3 FRAX 7.6%/0.9%  . Thyroid disease    hypothyroid    Current Outpatient Prescriptions on File Prior to Visit  Medication Sig Dispense Refill  . aspirin 81 MG tablet Take 81 mg by mouth daily.      . Calcium Carbonate-Vit D-Min (CALCIUM 1200 PO) Take 1 capsule by mouth every evening.     . cholecalciferol (VITAMIN D) 1000 UNITS tablet Take 1,000 Units by mouth daily.    . Fish Oil-Cholecalciferol (FISH OIL + D3) 1200-1000 MG-UNIT CAPS Take by mouth.       No current facility-administered medications on file prior to visit.     Allergies  Allergen Reactions  . Atorvastatin Hives  . Lobster [Shellfish Allergy] Hives  . Penicillins Hives  . Strawberry Extract Hives  . Erythromycin Other (See Comments)    Face feels like it is sunburned.   . Flexeril [Cyclobenzaprine]     Vomit   . Omnipaque [Iohexol]     Family History  Problem Relation Age of Onset  . Alzheimer's disease Mother   . Heart disease Father   . Pancreatic cancer Father   . Arthritis Father   . Hyperlipidemia Father   . Hypertension Father   . Cancer Paternal Aunt        Ovarian or Uterine cancer  . Aneurysm Paternal Uncle     Social History   Social History  . Marital status: Married    Spouse name: N/A  . Number of children: N/A  . Years of education: N/A   Social History Main Topics  . Smoking status: Current Every Day Smoker    Packs/day: 0.50   Types: Cigarettes  . Smokeless tobacco: Never Used  . Alcohol use 0.0 oz/week     Comment: rare  . Drug use: No  . Sexual activity: No   Other Topics Concern  . None   Social History Narrative   Lives with husband in a one story home.  Has 2 children.  Works part time as an Biochemist, clinical.  Education: some college.   Review of Systems - See HPI.  All other ROS are negative.  BP 118/70   Pulse 77   Temp 99.2 F (37.3 C) (Oral)   Resp 14   Ht 5\' 3"  (1.6 m)   Wt 136 lb (61.7 kg)   SpO2 98%   BMI 24.09 kg/m   Physical Exam  Constitutional: She is oriented to person, place, and time and well-developed, well-nourished, and in no distress.  HENT:  Head: Normocephalic and atraumatic.  Right Ear: External ear normal.  Left Ear: External ear normal.  Nose: Nose normal.  Mouth/Throat: Oropharynx is clear and moist. No oropharyngeal exudate.  Eyes: Conjunctivae are normal.  Neck: Neck supple.  Cardiovascular: Normal rate, regular rhythm, normal heart sounds and intact distal pulses.   Pulmonary/Chest: Effort normal and breath sounds normal. No respiratory distress. She has no wheezes. She  has no rales. She exhibits no tenderness.  Neurological: She is alert and oriented to person, place, and time.  Skin: Skin is warm and dry. No rash noted.  Psychiatric: Affect normal.  Vitals reviewed.   Recent Results (from the past 2160 hour(s))  TSH     Status: None   Collection Time: 12/25/16  8:20 AM  Result Value Ref Range   TSH 2.53 0.35 - 4.50 uIU/mL  T4, free     Status: None   Collection Time: 12/25/16  8:20 AM  Result Value Ref Range   Free T4 1.15 0.60 - 1.60 ng/dL    Comment: Specimens from patients who are undergoing biotin therapy and /or ingesting biotin supplements may contain high levels of biotin.  The higher biotin concentration in these specimens interferes with this Free T4 assay.  Specimens that contain high levels  of biotin may cause false high results for this  Free T4 assay.  Please interpret results in light of the total clinical presentation of the patient.    Vitamin B12     Status: None   Collection Time: 12/25/16  8:20 AM  Result Value Ref Range   Vitamin B-12 214 211 - 911 pg/mL    Assessment/Plan: 1. Acute bacterial bronchitis Rx Doxycycline.  Increase fluids.  Rest.  Saline nasal spray.  Probiotic.  Mucinex as directed.  Humidifier in bedroom. Tessalon per orders.  Call or return to clinic if symptoms are not improving.  - doxycycline (VIBRAMYCIN) 100 MG capsule; Take 1 capsule (100 mg total) by mouth 2 (two) times daily.  Dispense: 14 capsule; Refill: 0 - benzonatate (TESSALON) 100 MG capsule; Take 1 capsule (100 mg total) by mouth 2 (two) times daily as needed for cough.  Dispense: 20 capsule; Refill: 0   Leeanne Rio, [place]

## 2017-02-18 NOTE — Progress Notes (Signed)
Pre visit review using our clinic review tool, if applicable. No additional management support is needed unless otherwise documented below in the visit note. 

## 2017-04-28 ENCOUNTER — Encounter: Payer: Self-pay | Admitting: Family Medicine

## 2017-04-28 ENCOUNTER — Ambulatory Visit (INDEPENDENT_AMBULATORY_CARE_PROVIDER_SITE_OTHER): Payer: PPO | Admitting: Family Medicine

## 2017-04-28 VITALS — BP 125/84 | HR 56 | Temp 98.4°F | Resp 16 | Ht 63.0 in | Wt 139.2 lb

## 2017-04-28 DIAGNOSIS — Z23 Encounter for immunization: Secondary | ICD-10-CM

## 2017-04-28 DIAGNOSIS — Z1231 Encounter for screening mammogram for malignant neoplasm of breast: Secondary | ICD-10-CM

## 2017-04-28 DIAGNOSIS — E2839 Other primary ovarian failure: Secondary | ICD-10-CM | POA: Diagnosis not present

## 2017-04-28 DIAGNOSIS — E039 Hypothyroidism, unspecified: Secondary | ICD-10-CM

## 2017-04-28 DIAGNOSIS — E538 Deficiency of other specified B group vitamins: Secondary | ICD-10-CM | POA: Insufficient documentation

## 2017-04-28 DIAGNOSIS — Z0000 Encounter for general adult medical examination without abnormal findings: Secondary | ICD-10-CM | POA: Diagnosis not present

## 2017-04-28 DIAGNOSIS — E559 Vitamin D deficiency, unspecified: Secondary | ICD-10-CM

## 2017-04-28 DIAGNOSIS — E785 Hyperlipidemia, unspecified: Secondary | ICD-10-CM | POA: Diagnosis not present

## 2017-04-28 LAB — CBC WITH DIFFERENTIAL/PLATELET
BASOS ABS: 0.1 10*3/uL (ref 0.0–0.1)
Basophils Relative: 0.9 % (ref 0.0–3.0)
Eosinophils Absolute: 0.1 10*3/uL (ref 0.0–0.7)
Eosinophils Relative: 1.9 % (ref 0.0–5.0)
HEMATOCRIT: 42.3 % (ref 36.0–46.0)
HEMOGLOBIN: 14 g/dL (ref 12.0–15.0)
LYMPHS ABS: 1.9 10*3/uL (ref 0.7–4.0)
Lymphocytes Relative: 30.2 % (ref 12.0–46.0)
MCHC: 33.1 g/dL (ref 30.0–36.0)
MCV: 95.5 fl (ref 78.0–100.0)
Monocytes Absolute: 0.5 10*3/uL (ref 0.1–1.0)
Monocytes Relative: 7.4 % (ref 3.0–12.0)
NEUTROS PCT: 59.6 % (ref 43.0–77.0)
Neutro Abs: 3.7 10*3/uL (ref 1.4–7.7)
Platelets: 287 10*3/uL (ref 150.0–400.0)
RBC: 4.43 Mil/uL (ref 3.87–5.11)
RDW: 14.8 % (ref 11.5–15.5)
WBC: 6.3 10*3/uL (ref 4.0–10.5)

## 2017-04-28 LAB — BASIC METABOLIC PANEL
BUN: 15 mg/dL (ref 6–23)
CHLORIDE: 105 meq/L (ref 96–112)
CO2: 32 meq/L (ref 19–32)
CREATININE: 0.92 mg/dL (ref 0.40–1.20)
Calcium: 9.8 mg/dL (ref 8.4–10.5)
GFR: 65.02 mL/min (ref >60.00)
GLUCOSE: 92 mg/dL (ref 70–99)
Potassium: 4.4 mEq/L (ref 3.5–5.1)
Sodium: 141 mEq/L (ref 135–145)

## 2017-04-28 LAB — LIPID PANEL
CHOLESTEROL: 189 mg/dL (ref 0–200)
HDL: 57.9 mg/dL (ref >39.00)
LDL Cholesterol: 111 mg/dL — ABNORMAL HIGH (ref 0–99)
NonHDL: 131.4
Total CHOL/HDL Ratio: 3
Triglycerides: 100 mg/dL (ref 0.0–149.0)
VLDL: 20 mg/dL (ref 0.0–40.0)

## 2017-04-28 LAB — HEPATIC FUNCTION PANEL
ALBUMIN: 4.1 g/dL (ref 3.5–5.2)
ALK PHOS: 60 U/L (ref 39–117)
ALT: 15 U/L (ref 0–35)
AST: 19 U/L (ref 0–37)
Bilirubin, Direct: 0.1 mg/dL (ref 0.0–0.3)
TOTAL PROTEIN: 7.1 g/dL (ref 6.0–8.3)
Total Bilirubin: 0.6 mg/dL (ref 0.2–1.2)

## 2017-04-28 LAB — VITAMIN D 25 HYDROXY (VIT D DEFICIENCY, FRACTURES): VITD: 45.14 ng/mL (ref 30.00–100.00)

## 2017-04-28 LAB — VITAMIN B12

## 2017-04-28 LAB — TSH: TSH: 1.56 u[IU]/mL (ref 0.35–4.50)

## 2017-04-28 NOTE — Assessment & Plan Note (Signed)
Chronic problem.  Check labs.  Replete prn. 

## 2017-04-28 NOTE — Assessment & Plan Note (Signed)
Chronic problem, following w/ Dr Cruzita Lederer.  Asymptomatic at this time.  Will follow along.

## 2017-04-28 NOTE — Addendum Note (Signed)
Addended by: Davis Gourd on: 04/28/2017 09:49 AM   Modules accepted: Orders

## 2017-04-28 NOTE — Patient Instructions (Signed)
Follow up in 1 year or as needed- you will see me and our [place], for the Medicare Wellness We'll notify you of your lab results and make any changes if needed Continue to work on healthy diet and regular exercise- you look great! We'll call you with your mammogram and bone density appt at [facility] are not due for colonoscopy until 2023- yay! You no longer need paps- great news! Call with any questions or concerns Enjoy the rest of your summer!!!

## 2017-04-28 NOTE — Assessment & Plan Note (Signed)
Chronic problem.  Attempting to control w/ healthy diet and regular exercise.  Check labs.  Start meds prn. 

## 2017-04-28 NOTE — Assessment & Plan Note (Signed)
Noted on labs done at Endo.  Pt has been taking supplementation.  Repeat labs today and adjust tx prn.

## 2017-04-28 NOTE — Progress Notes (Signed)
Pre visit review using our clinic review tool, if applicable. No additional management support is needed unless otherwise documented below in the visit note. 

## 2017-04-28 NOTE — Progress Notes (Signed)
   Subjective:    Patient ID: Sylvia Stevens, female    DOB: 06/03/1951, 66 y.o.   MRN: [id number]  HPI Here today for Welcome To Medicare CPE.  Risk Factors: Hypothyroid- chronic problem, on Levothyroxine 34mcg daily Hyperlipidemia- chronic problem.  Attempting to control w/ healthy diet and regular exercise.  On Fish oil daily. Physical Activity: riding stationary bike and walking regularly Fall Risk: low Depression: denies sxs Hearing: normal to conversational tones ADL's: independent Cognitive: normal linear thought process, memory and attention intact Home Safety: safe at home, lives w/ husband Height, Weight, BMI, Visual Acuity: see vitals, vision corrected to 20/20 w/ glasses Counseling: UTD on colonoscopy, due for mammo/DEXA.  Due for Prevnar Care team reviewed and updated w/ pt POA/Living will: pt has living wills established Labs Ordered: See A&P Care Plan: See A&P    Review of Systems Patient reports no vision/ hearing changes, adenopathy,fever, weight change,  persistant/recurrent hoarseness , swallowing issues, chest pain, palpitations, edema, persistant/recurrent cough, hemoptysis, dyspnea (rest/exertional/paroxysmal nocturnal), gastrointestinal bleeding (melena, rectal bleeding), abdominal pain, significant heartburn, bowel changes, GU symptoms (dysuria, hematuria, incontinence), Gyn symptoms (abnormal  bleeding, pain),  syncope, focal weakness, memory loss, numbness & tingling, skin/hair/nail changes, abnormal bruising or bleeding, anxiety, or depression.     Objective:   Physical Exam  General Appearance:    Alert, cooperative, no distress, appears stated age  Head:    Normocephalic, without obvious abnormality, atraumatic  Eyes:    PERRL, conjunctiva/corneas clear, EOM's intact, fundi    benign, both eyes  Ears:    Normal TM's and external ear canals, both ears  Nose:   Nares normal, septum midline, mucosa normal, no drainage    or sinus tenderness  Throat:   Lips,  mucosa, and tongue normal; teeth and gums normal  Neck:   Supple, symmetrical, trachea midline, no adenopathy;    Thyroid: no enlargement/tenderness/nodules  Back:     Symmetric, no curvature, ROM normal, no CVA tenderness  Lungs:     Clear to auscultation bilaterally, respirations unlabored  Chest Wall:    No tenderness or deformity   Heart:    Regular rate and rhythm, S1 and S2 normal, no murmur, rub   or gallop  Breast Exam:    No tenderness, masses, or nipple abnormality  Abdomen:     Soft, non-tender, bowel sounds active all four quadrants,    no masses, no organomegaly  Genitalia:    Deferred at pt's request  Rectal:    Extremities:   Extremities normal, atraumatic, no cyanosis or edema  Pulses:   2+ and symmetric all extremities  Skin:   Skin color, texture, turgor normal, no rashes or lesions  Lymph nodes:   Cervical, supraclavicular, and axillary nodes normal  Neurologic:   CNII-XII intact, normal strength, sensation and reflexes    throughout          Assessment & Plan:

## 2017-04-29 ENCOUNTER — Encounter: Payer: Self-pay | Admitting: General Practice

## 2017-05-14 ENCOUNTER — Encounter: Payer: Self-pay | Admitting: Family Medicine

## 2017-05-17 ENCOUNTER — Other Ambulatory Visit: Payer: Self-pay | Admitting: Internal Medicine

## 2017-06-04 DIAGNOSIS — H04123 Dry eye syndrome of bilateral lacrimal glands: Secondary | ICD-10-CM | POA: Diagnosis not present

## 2017-06-04 DIAGNOSIS — H2513 Age-related nuclear cataract, bilateral: Secondary | ICD-10-CM | POA: Diagnosis not present

## 2017-06-19 DIAGNOSIS — Z1231 Encounter for screening mammogram for malignant neoplasm of breast: Secondary | ICD-10-CM | POA: Diagnosis not present

## 2017-06-19 DIAGNOSIS — M8589 Other specified disorders of bone density and structure, multiple sites: Secondary | ICD-10-CM | POA: Diagnosis not present

## 2017-06-19 LAB — HM DEXA SCAN

## 2017-06-19 LAB — HM MAMMOGRAPHY

## 2017-06-23 ENCOUNTER — Encounter: Payer: Self-pay | Admitting: General Practice

## 2017-06-30 ENCOUNTER — Encounter: Payer: Self-pay | Admitting: General Practice

## 2017-08-15 ENCOUNTER — Other Ambulatory Visit: Payer: Self-pay | Admitting: Internal Medicine

## 2017-11-07 ENCOUNTER — Other Ambulatory Visit: Payer: Self-pay | Admitting: Internal Medicine

## 2017-12-25 ENCOUNTER — Ambulatory Visit: Payer: PPO | Admitting: Internal Medicine

## 2018-01-13 ENCOUNTER — Encounter: Payer: Self-pay | Admitting: Internal Medicine

## 2018-01-13 ENCOUNTER — Ambulatory Visit (INDEPENDENT_AMBULATORY_CARE_PROVIDER_SITE_OTHER): Payer: PPO | Admitting: Internal Medicine

## 2018-01-13 VITALS — BP 152/88 | HR 74 | Ht 63.0 in | Wt 141.2 lb

## 2018-01-13 DIAGNOSIS — E538 Deficiency of other specified B group vitamins: Secondary | ICD-10-CM

## 2018-01-13 DIAGNOSIS — E039 Hypothyroidism, unspecified: Secondary | ICD-10-CM

## 2018-01-13 DIAGNOSIS — E559 Vitamin D deficiency, unspecified: Secondary | ICD-10-CM | POA: Diagnosis not present

## 2018-01-13 LAB — T4, FREE: FREE T4: 1.07 ng/dL (ref 0.60–1.60)

## 2018-01-13 LAB — VITAMIN B12: Vitamin B-12: >1500 pg/mL — ABNORMAL HIGH (ref 211–911)

## 2018-01-13 LAB — TSH: TSH: 3.14 u[IU]/mL (ref 0.35–4.50)

## 2018-01-13 LAB — VITAMIN D 25 HYDROXY (VIT D DEFICIENCY, FRACTURES): VITD: 41.98 ng/mL (ref 30.00–100.00)

## 2018-01-13 MED ORDER — LEVOTHYROXINE SODIUM 88 MCG PO TABS: ORAL_TABLET | ORAL | 3 refills | Status: DC

## 2018-01-13 NOTE — Patient Instructions (Signed)
Please stop at the lab.  Continue Levothyroxine 88 mcg daily.  Take the thyroid hormone every day, with water, at least 30 minutes before breakfast, separated by at least 4 hours from: - acid reflux medications - calcium - iron - multivitamins  Please return in 1 year.  

## 2018-01-13 NOTE — Progress Notes (Signed)
Subjective:     Patient ID: Sylvia Stevens, female   DOB: March 19, 1951, 66 y.o.   MRN: [id number]  HPI Sylvia Stevens is a 67 y.o.woman, returning for f/u for well controlled hypothyroidism, dx 2008. Last visit was 1 year ago.  Since last visit, her husband was dx'ed with breast CA. Now undergoing neoadjuvant tx.  Pt has well-controlled hypothyroidism without frequent changes in the doses of her levothyroxine.  Pt is on levothyroxine 88 mcg daily, taken: - in am, with tea - fasting - at least 1h from b'fast - + Ca with dinner - no Fe, MVI - + occas. PPIs later in the day - not on Biotin  Reviewed TFTs: Lab Results  Component Value Date   TSH 1.56 04/28/2017   TSH 2.53 12/25/2016   TSH 2.07 04/25/2016   TSH 1.41 12/26/2015   TSH 1.31 03/29/2015   TSH 2.61 12/22/2014   TSH 0.74 03/27/2014   TSH 1.32 12/01/2013   TSH 0.66 06/03/2013   TSH 0.91 03/25/2013   FREET4 1.15 12/25/2016   FREET4 1.13 12/26/2015   FREET4 0.98 12/22/2014   FREET4 1.06 12/01/2013   FREET4 1.11 06/03/2013   FREET4 1.11 02/04/2013   FREET4 1.51 11/16/2012   FREET4 1.38 08/18/2012   No FH of thyroid ds. No FH of thyroid cancer. No h/o radiation tx to head or neck.  No seaweed or kelp. No recent contrast studies. No herbal supplements. No Biotin use. No recent steroids use.   She also has a history of hypertension, osteopenia, osteoarthritis, bursitis.    At last visit, she was complaining of fatigue.  A B12 level was found to be low in the normal range.  We started vitamin B12 5000 mcg daily and a subsequent level was high. She then changed to 5000 mcg every other day. Lab Results  Component Value Date   VITAMINB12 >1500 (H) 04/28/2017   VITAMINB12 214 12/25/2016   She also has a history of vitamin D deficiency. Latest level normal: Lab Results  Component Value Date   VD25OH 45.14 04/28/2017   VD25OH 37.63 04/25/2016   VD25OH 45.45 03/29/2015   VD25OH 53.36 03/27/2014   VD25OH 28 (L) 12/01/2013     Review of Systems Constitutional: no weight gain/no weight loss, no fatigue, no subjective hyperthermia, no subjective hypothermia Eyes: no blurry vision, no xerophthalmia ENT: no sore throat, + see HPI Cardiovascular: no CP/no SOB/no palpitations/no leg swelling Respiratory: no cough/no SOB/no wheezing Gastrointestinal: no N/no V/no D/no C/no acid reflux Musculoskeletal: no muscle aches/no joint aches Skin: no rashes, no hair loss Neurological: + Occasional chronic L hand tremors/no numbness/no tingling/no dizziness  I reviewed pt's medications, allergies, PMH, social hx, family hx, and changes were documented in the history of present illness. Otherwise, unchanged from my initial visit note.  Objective:   Physical Exam BP (!) 152/88   Pulse 74   Ht 5\' 3"  (1.6 m)   Wt 141 lb 3.2 oz (64 kg)   SpO2 98%   BMI 25.01 kg/m  Body mass index is 25.01 kg/m. Wt Readings from Last 3 Encounters:  01/13/18 141 lb 3.2 oz (64 kg)  04/28/17 139 lb 4 oz (63.2 kg)  02/18/17 136 lb (61.7 kg)   Constitutional: Normal weight, in NAD Eyes: PERRLA, EOMI, no exophthalmos ENT: moist mucous membranes, no thyromegaly, no cervical lymphadenopathy except  R upper cervical LAD - 1 enlarged LN - chronic Cardiovascular: RRR, No MRG Respiratory: CTA B Gastrointestinal: abdomen soft, NT, ND, BS+ Musculoskeletal: no  deformities, strength intact in all 4 Skin: moist, warm, no rashes Neurological: no tremor with outstretched hands, DTR normal in all 4  Assessment:     1. Hypothyroidism  - on  stable  dose of levothyroxine generic since diagnosis  2.  Low vitamin B12  3.  Vitamin D deficiency     Plan:     1. Patient with long history of controlled hypothyroidism, on levothyroxine - latest thyroid labs reviewed with pt >> normal  - she continues on LT4 88 mcg daily - pt feels good on this dose. - we discussed about taking the thyroid hormone every day, with water, >30 minutes before breakfast,  separated by >4 hours from acid reflux medications, calcium, iron, multivitamins. Pt. is taking it correctly. - will check thyroid tests today: TSH and fT4 - If labs are abnormal, she will need to return for repeat TFTs in 1.5 months  2.  Low vitamin B12 - Vitamin B12 was low in the normal range at last visit - At that time we started 5000 mcg po vitamin B12 daily  - subsequent B12 level was >1500 last summer >> changed to qod - will recheck level today   3.  Vitamin D deficiency  - She has a history of vitamin D deficiency - Latest level reviewed >> normal last summer - We will recheck level today  Needs refills for 90 days.  Office Visit on 01/13/2018  Component Date Value Ref Range Status  . Free T4 01/13/2018 1.07  0.60 - 1.60 ng/dL Final   Comment: Specimens from patients who are undergoing biotin therapy and /or ingesting biotin supplements may contain high levels of biotin.  The higher biotin concentration in these specimens interferes with this Free T4 assay.  Specimens that contain high levels  of biotin may cause false high results for this Free T4 assay.  Please interpret results in light of the total clinical presentation of the patient.    Marland Kitchen TSH 01/13/2018 3.14  0.35 - 4.50 uIU/mL Final  . Vitamin B-12 01/13/2018 >1500* 211 - 911 pg/mL Final  . VITD 01/13/2018 41.98  30.00 - 100.00 ng/mL Final   Msg sent: Dear Sylvia Stevens, Labs are normal, with exception of the B12 vitamin which is still quite high.  At this point, you can reduce the dose to 1000 units daily.  Alternatively, you can take the 5000 units every 5 days. Sincerely, Philemon Kingdom MD  Philemon Kingdom, MD PhD [facility]

## 2018-04-12 ENCOUNTER — Encounter: Payer: Self-pay | Admitting: Family Medicine

## 2018-04-12 ENCOUNTER — Ambulatory Visit (INDEPENDENT_AMBULATORY_CARE_PROVIDER_SITE_OTHER): Payer: PPO

## 2018-04-12 ENCOUNTER — Other Ambulatory Visit: Payer: Self-pay

## 2018-04-12 ENCOUNTER — Ambulatory Visit (INDEPENDENT_AMBULATORY_CARE_PROVIDER_SITE_OTHER): Payer: PPO | Admitting: Family Medicine

## 2018-04-12 VITALS — BP 130/83 | HR 71 | Temp 99.1°F | Resp 16 | Ht 63.0 in | Wt 142.4 lb

## 2018-04-12 DIAGNOSIS — S60032A Contusion of left middle finger without damage to nail, initial encounter: Secondary | ICD-10-CM | POA: Diagnosis not present

## 2018-04-12 DIAGNOSIS — S67193A Crushing injury of left middle finger, initial encounter: Secondary | ICD-10-CM

## 2018-04-12 NOTE — Progress Notes (Signed)
   Subjective:    Patient ID: Sylvia Stevens, female    DOB: [birth year], 67 y.o.   MRN: [id number]  HPI Finger injury- smashed finger between 2 doors 10 days ago.  L middle finger- PIP joint stiff w/ limited ROM.  Mild bruising.  Some swelling.   Review of Systems For ROS see HPI     Objective:   Physical Exam  Constitutional: She is oriented to person, place, and time. She appears well-developed and well-nourished. No distress.  Cardiovascular: Intact distal pulses.  Musculoskeletal: She exhibits edema (mild swelling of L middle PIP joint) and tenderness (TTP over L middle PIP joint). She exhibits no deformity.  Limited flexion of L middle PIP joint, full extension.  No strength or sensory deficit  Neurological: She is alert and oriented to person, place, and time. No sensory deficit.  Skin: Skin is warm and dry. No erythema.  Vitals reviewed.         Assessment & Plan:  Crush injury L middle finger- occurred 10 days ago when pt smashed finger between 2 doors.  Some swelling, decreased flexion at PIP joint, full extension.  Get xray to assess.  Finger splinted.  Reviewed supportive care and red flags that should prompt return.  Pt expressed understanding and is in agreement w/ plan.

## 2018-04-12 NOTE — Patient Instructions (Signed)
Go to the [place] office at [place] for your xray We'll notify you of the results ICE your hand for pain relief Wear the splint for comfort and protection for the next 2 weeks- you can remove this to wash as needed Call with any questions or concerns Hang in there!!!

## 2018-05-06 ENCOUNTER — Ambulatory Visit: Payer: PPO

## 2018-05-06 ENCOUNTER — Encounter: Payer: PPO | Admitting: Family Medicine

## 2018-06-04 ENCOUNTER — Ambulatory Visit: Payer: Self-pay

## 2018-06-04 ENCOUNTER — Other Ambulatory Visit: Payer: Self-pay

## 2018-06-04 ENCOUNTER — Encounter: Payer: Self-pay | Admitting: Family Medicine

## 2018-06-04 ENCOUNTER — Ambulatory Visit (INDEPENDENT_AMBULATORY_CARE_PROVIDER_SITE_OTHER): Payer: PPO | Admitting: Family Medicine

## 2018-06-04 VITALS — BP 123/82 | HR 83 | Temp 99.3°F | Resp 17 | Ht 63.0 in | Wt 136.5 lb

## 2018-06-04 DIAGNOSIS — J329 Chronic sinusitis, unspecified: Secondary | ICD-10-CM | POA: Diagnosis not present

## 2018-06-04 DIAGNOSIS — B9689 Other specified bacterial agents as the cause of diseases classified elsewhere: Secondary | ICD-10-CM

## 2018-06-04 MED ORDER — DOXYCYCLINE HYCLATE 100 MG PO TABS: 100.0000 mg | ORAL_TABLET | Freq: Two times a day (BID) | ORAL | 0 refills | Status: DC

## 2018-06-04 NOTE — Patient Instructions (Signed)
Follow up as needed or as scheduled START the Doxycycline twice daily- take w/ food ADD daily Claritin or Zyrtec to improve your allergy congestion Drink plenty of fluids REST! Call with any questions or concerns Hang in there!!

## 2018-06-04 NOTE — Telephone Encounter (Signed)
Pt. called to report receiving several yellow jacket stings on right index and ring finger on 9/19.  Reported had initial redness, itching, and swelling of the right hand.  Reported using Actifed, ice packs and  Aleve.  Reported onset of nonproductive cough on 9/19. and attributed the cough to mowing the yard.  Denied any shortness of breath.  Reported onset of dizziness, nausea and vomiting on 9/24.  Vomited x 3 on Tues.  Questioned if these sx's could be a result of the multiple stings she rec'd.  Stated that the sting sites are "light red, puffy, burn and itch"; the swelling of the right hand has gone down.  Reported "about 3-4 pinpoint blisters at the sting sites."  Denied fever/ chills.  Stated her nausea continues, but denied any further vomiting since Tues.  During call BP 143/85, pulse 87.  Stated she has hx of vertigo, but that this doesn't feel like her typical vertigo.  Stated "it feels like when my BP has been low."      Care advice given per protocol.  Advised needs an appt.; appt. given at 2:00 PM.  Verb. Understanding.  Notified [facility] at [facility] re: adding onto Dr. Virgil Benedict schedule in a "same day" slot with multiple symptoms. Will forward note to office for Dr. Birdie Riddle to review.       Reason for Disposition . [1] Red or very tender (to touch) area AND [2] getting larger over 48 hours after the sting    Reported light red, puffy, burning, itching sting sites on right index and ring fingers; stings rec'd on 05/27/18 . [1] MILD dizziness (e.g., walking normally) AND [2] has NOT been evaluated by physician for this  (Exception: dizziness caused by heat exposure, sudden standing, or poor fluid intake)    C/o mild dizziness with sitting and moving; onset on 9/24; "I just want to lay down."  Answer Assessment - Initial Assessment Questions 1. TYPE: "What type of sting was it?" (bee, yellow jacket, etc.)      Yellow jacket 2. ONSET: "When did it occur?"      05/27/18 3. LOCATION: "Where is the sting  located?"  "How many stings?"     Index and ring finger of right hand 4. SWELLING SIZE: "How big is the swelling?" (inches or centimeters)     Hand swelling subsided; has puffiness on fingers at sting sites; pinpoint blisters  5. REDNESS: "Is the area red or pink?" If so, ask "What size is area of redness?" (inches or cm). "When did the redness start?"     Redness at sting sites 6. PAIN: "Is there any pain?" If so, ask: "How bad is it?"  (Scale 1-10; or mild, moderate, severe)     Burning pain 7. ITCHING: "Is there any itching?" If so, ask: "How bad is it?"      "itches like crazy" 8. RESPIRATORY DISTRESS: "Describe your breathing."     Coughing more; "like I have bronchitis" 9. PRIOR REACTIONS: "Have you had any severe allergic reactions to stings in the past?" if yes, ask: "What happened?"     Never been stung by bee or yellow jacket 10. OTHER SYMPTOMS: "Do you have any other symptoms?" (e.g., face or tongue swelling, new rash elsewhere, abdominal pain, vomiting)       Denied rash elsewhere; had extreme itching of right forearm; dizziness with sitting or moving; coughing intermittently; no fever   11. PREGNANCY: "Is there any chance you are pregnant?" "When was your last menstrual  period?"      n/a  Answer Assessment - Initial Assessment Questions 1. DESCRIPTION: "Describe your dizziness."     Like her head is swimming  2. LIGHTHEADED: "Do you feel lightheaded?" (e.g., somewhat faint, woozy, weak upon standing)     Denied 3. VERTIGO: "Do you feel like either you or the room is spinning or tilting?" (i.e. vertigo)    "not like my normal vertigo 4. SEVERITY: "How bad is it?"  "Do you feel like you are going to faint?" "Can you stand and walk?"   - MILD - walking normally   - MODERATE - interferes with normal activities (e.g., work, school)    - SEVERE - unable to stand, requires support to walk, feels like passing out now.      mild 5. ONSET:  "When did the dizziness begin?"     Tuesday, 9/24 6. AGGRAVATING FACTORS: "Does anything make it worse?" (e.g., standing, change in head position)     Occurs with sitting and moving; wants to just lay down 7. HEART RATE: "Can you tell me your heart rate?" "How many beats in 15 seconds?"  (Note: not all patients can do this)      Unable to find pulse 8. CAUSE: "What do you think is causing the dizziness?"     Unknown; rec'd yellow jacket stings on 05/27/18 9. RECURRENT SYMPTOM: "Have you had dizziness before?" If so, ask: "When was the last time?" "What happened that time?"     "Feels like when she has low BP"   10. OTHER SYMPTOMS: "Do you have any other symptoms?" (e.g., fever, chest pain, vomiting, diarrhea, bleeding)       Nausea/ vomiting ; vomited x 3 on Tues, 9/24; cough 11. PREGNANCY: "Is there any chance you are pregnant?" "When was your last menstrual period?"       n/a  Protocols used: BEE OR YELLOW JACKET STING-A-AH, DIZZINESS - [facility]

## 2018-06-04 NOTE — Progress Notes (Signed)
   Subjective:    Patient ID: Sylvia Stevens, female    DOB: [birth year], 67 y.o.   MRN: [id number]  HPI 'my head is just pounding'- sxs started Tuesday.  Denies preceding nasal congestion.  + cough.  + facial pain/pressure.  No tooth pain.  + vomiting on Tuesday.  No diarrhea.  No sore throat.  No ear pain.  No fevers.  + sick contacts- husband and daughter.   Review of Systems For ROS see HPI     Objective:   Physical Exam  Constitutional: She appears well-developed and well-nourished. No distress.  HENT:  Head: Normocephalic and atraumatic.  Right Ear: Tympanic membrane normal.  Left Ear: Tympanic membrane normal.  Nose: Mucosal edema and rhinorrhea present. Right sinus exhibits frontal sinus tenderness. Right sinus exhibits no maxillary sinus tenderness. Left sinus exhibits frontal sinus tenderness. Left sinus exhibits no maxillary sinus tenderness.  Mouth/Throat: Uvula is midline and mucous membranes are normal. Posterior oropharyngeal erythema present. No oropharyngeal exudate.  Eyes: Pupils are equal, round, and reactive to light. Conjunctivae and EOM are normal.  Neck: Normal range of motion. Neck supple.  Cardiovascular: Normal rate, regular rhythm and normal heart sounds.  Pulmonary/Chest: Effort normal and breath sounds normal. No respiratory distress. She has no wheezes.  Lymphadenopathy:    She has no cervical adenopathy.  Vitals reviewed.         Assessment & Plan:  Bacterial sinusitis- pt's sxs and PE consistent w/ infxn.  Reassured her that her sxs are not at all related to her recent yellow jacket stings.  Dizziness and nausea are byproducts of her untreated seasonal allergies and current infxn.  Reviewed supportive care and red flags that should prompt return.  Pt expressed understanding and is in agreement w/ plan.

## 2018-06-23 DIAGNOSIS — Z1231 Encounter for screening mammogram for malignant neoplasm of breast: Secondary | ICD-10-CM | POA: Diagnosis not present

## 2018-06-23 LAB — HM MAMMOGRAPHY

## 2018-06-30 ENCOUNTER — Ambulatory Visit: Payer: PPO

## 2018-06-30 ENCOUNTER — Encounter: Payer: Self-pay | Admitting: General Practice

## 2018-07-05 NOTE — Progress Notes (Addendum)
Subjective:   Sylvia Stevens is a 67 y.o. female who presents for an Initial Medicare Annual Wellness Visit.  Review of Systems    No ROS.  Medicare Wellness Visit. Additional risk factors are reflected in the social history.  Cardiac Risk Factors include: advanced age (>52men, >62 women);smoking/ tobacco exposure;hypertension Sleep patterns: Sleeps 6-7 hours, up to void x 1.  Home Safety/Smoke Alarms: Feels safe in home. Smoke alarms in place.  Living environment; residence and Firearm Safety: Lives with husband and adult daughter in 1 story home, 1 step at door.  Seat Belt Safety/Bike Helmet: Wears seat belt.   Female:   Pap-N/A     Mammo-06/23/2018, bi-rads 1.         Dexa scan-06/19/2017, Osteopenia.         CCS-Colonoscopy 02/05/2012, polyp. Recall 5 years. GI consult ordered today.      Objective:    Today's Vitals   07/06/18 0902  BP: 136/72  Pulse: 65  SpO2: 98%  Weight: 141 lb 8 oz (64.2 kg)  Height: 5\' 3"  (1.6 m)   Body mass index is 25.07 kg/m.  Advanced Directives 07/06/2018  Does Patient Have a Medical Advance Directive? Yes  Type of Advance Directive Living will    Current Medications (verified) Outpatient Encounter Medications as of 07/06/2018  Medication Sig  . aspirin 81 MG tablet Take 81 mg by mouth daily.    . Calcium Carbonate-Vit D-Min (CALCIUM 1200 PO) Take 1 capsule by mouth every evening.   . cholecalciferol (VITAMIN D) 1000 UNITS tablet Take 2,000 Units by mouth daily.   . Cyanocobalamin (VITAMIN B-12) 5000 MCG SUBL Place 1 tablet under the tongue daily.  . Fish Oil-Cholecalciferol (FISH OIL + D3) 1200-1000 MG-UNIT CAPS Take by mouth.    . levothyroxine (SYNTHROID, LEVOTHROID) 88 MCG tablet TAKE 1 TABLET BY MOUTH EVERY MORNING BEFORE BREAKFAST ON AN EMPTY STOMACH  . [DISCONTINUED] doxycycline (VIBRA-TABS) 100 MG tablet Take 1 tablet (100 mg total) by mouth 2 (two) times daily.   No facility-administered encounter medications on file as of  07/06/2018.     Allergies (verified) Atorvastatin; Lobster [shellfish allergy]; Penicillins; Strawberry extract; Erythromycin; Flexeril [cyclobenzaprine]; and Omnipaque [iohexol]   History: Past Medical History:  Diagnosis Date  . Arthritis   . Endometrial polyp   . Hypertension   . Osteopenia 09/16/2012   T score -1.3 FRAX 7.6%/0.9%  . Thyroid disease    hypothyroid   Past Surgical History:  Procedure Laterality Date  . APPENDECTOMY    . Bladder Bx    . CARPAL TUNNEL RELEASE     X 2  . CESAREAN SECTION     X 2  . HERNIA REPAIR    . TOE SURGERY    . TUBAL LIGATION     Family History  Problem Relation Age of Onset  . Alzheimer's disease Mother   . Heart disease Father   . Pancreatic cancer Father   . Arthritis Father   . Hyperlipidemia Father   . Hypertension Father   . Cancer Paternal Aunt        Ovarian or Uterine cancer  . Aneurysm Paternal Uncle    Social History   Socioeconomic History  . Marital status: Married    Spouse name: Not on file  . Number of children: Not on file  . Years of education: Not on file  . Highest education level: Not on file  Occupational History  . Not on file  Social Needs  . Financial  resource strain: Not on file  . Food insecurity:    Worry: Not on file    Inability: Not on file  . Transportation needs:    Medical: Not on file    Non-medical: Not on file  Tobacco Use  . Smoking status: Current Every Day Smoker    Packs/day: 0.50    Types: Cigarettes  . Smokeless tobacco: Never Used  Substance and Sexual Activity  . Alcohol use: Yes    Alcohol/week: 0.0 standard drinks    Comment: rare  . Drug use: No  . Sexual activity: Never    Birth control/protection: Surgical  Lifestyle  . Physical activity:    Days per week: Not on file    Minutes per session: Not on file  . Stress: Not on file  Relationships  . Social connections:    Talks on phone: Not on file    Gets together: Not on file    Attends religious service:  Not on file    Active member of club or organization: Not on file    Attends meetings of clubs or organizations: Not on file    Relationship status: Not on file  Other Topics Concern  . Not on file  Social History Narrative   Lives with husband in a one story home.  Has 2 children.  Works part time as an Biochemist, clinical.  Education: some college.    Tobacco Counseling Ready to quit: No Counseling given: No   Activities of Daily Living In your present state of health, do you have any difficulty performing the following activities: 07/06/2018 06/04/2018  Hearing? N N  Vision? N N  Difficulty concentrating or making decisions? N N  Walking or climbing stairs? Y N  Comment ROM limited left knee -  Dressing or bathing? N N  Doing errands, shopping? N N  Preparing Food and eating ? N -  Using the Toilet? N -  In the past six months, have you accidently leaked urine? N -  Do you have problems with loss of bowel control? N -  Managing your Medications? N -  Managing your Finances? N -  Housekeeping or managing your Housekeeping? N -  Some recent data might be hidden     Immunizations and Health Maintenance Immunization History  Administered Date(s) Administered  . Influenza Whole 06/10/2012, 06/14/2013  . Influenza,inj,Quad PF,6+ Mos 06/05/2014, 05/11/2015  . Influenza-Unspecified 05/23/2016, 05/15/2017, 05/19/2018  . Pneumococcal Conjugate-13 04/28/2017  . Pneumococcal Polysaccharide-23 07/06/2018  . Tdap 09/08/2010  . Zoster 06/22/2013   There are no preventive care reminders to display for this patient.  Patient Care Team: Midge Minium, MD as PCP - General (Family Medicine) Suella Broad, MD as Consulting Physician (Physical Medicine and Rehabilitation) Philemon Kingdom, MD as Consulting Physician (Internal Medicine) Richmond Campbell, MD as Consulting Physician (Gastroenterology)  Indicate any recent Medical Services you may have received from other than  [facility] providers in the past year (date may be approximate).     Assessment:   This is a routine wellness examination for [name].  Hearing/Vision screen Hearing Screening Comments: Able to hear conversational tones w/o difficulty. No issues reported.   Vision Screening Comments: Last exam 06/08/2017, yearly. Dr. Katy Fitch. Wears glasses.   Dietary issues and exercise activities discussed: Current Exercise Habits: Home exercise routine(Maintains housework, lawn care), Type of exercise: walking, Exercise limited by: None identified Diet (meal preparation, eat out, water intake, caffeinated beverages, dairy products, fruits and vegetables): Drinks unsweet tea and water.  Breakfast: oatmeal; tea Lunch: skips; fast food 2/week Dinner: lean protein and vegetables (green), starch.   Goals    . Weight (lb) < 135 lb (61.2 kg)     Lose weight by increasing activity.       Depression Screen PHQ 2/9 Scores 07/06/2018 06/04/2018 04/12/2018 04/28/2017 04/25/2016 03/29/2015 03/25/2013  PHQ - 2 Score 0 0 0 0 0 0 0  PHQ- 9 Score - 0 0 0 - - -    Fall Risk Fall Risk  07/06/2018 06/04/2018 04/12/2018 04/28/2017 04/25/2016  Falls in the past year? No No No No No     Cognitive Function: MMSE - Mini Mental State Exam 07/06/2018  Orientation to time 5  Orientation to Place 5  Registration 3  Attention/ Calculation 5  Recall 3  Language- name 2 objects 2  Language- repeat 1  Language- follow 3 step command 3  Language- read & follow direction 1  Write a sentence 1  Copy design 1  Total score 30        Screening Tests Health Maintenance  Topic Date Due  . Hepatitis C Screening  04/13/2019 (Originally [birth year])  . PNA vac Low Risk Adult (2 of 2 - PPSV23) 06/05/2019 (Originally 04/28/2018)  . DEXA SCAN  06/20/2019  . MAMMOGRAM  06/24/2019  . TETANUS/TDAP  09/08/2020  . COLONOSCOPY  02/04/2022  . INFLUENZA VACCINE  Completed      Plan:    Schedule GI appt  Bring a copy of your living will  and/or healthcare power of attorney to your next office visit.  Continue doing brain stimulating activities (puzzles, reading, adult coloring books, staying active) to keep memory sharp.   I have personally reviewed and noted the following in the patient's chart:   . Medical and social history . Use of alcohol, tobacco or illicit drugs  . Current medications and supplements . Functional ability and status . Nutritional status . Physical activity . Advanced directives . List of other physicians . Hospitalizations, surgeries, and ER visits in previous 12 months . Vitals . Screenings to include cognitive, depression, and falls . Referrals and appointments  In addition, I have reviewed and discussed with patient certain preventive protocols, quality metrics, and best practice recommendations. A written personalized care plan for preventive services as well as general preventive health recommendations were provided to patient.     Gerilyn Nestle, RN   07/06/2018    F/U with PCP 08/2018  Reviewed documentation provided by RN and agree w/ above.  Annye Asa, MD

## 2018-07-06 ENCOUNTER — Other Ambulatory Visit: Payer: Self-pay

## 2018-07-06 ENCOUNTER — Ambulatory Visit (INDEPENDENT_AMBULATORY_CARE_PROVIDER_SITE_OTHER): Payer: PPO

## 2018-07-06 VITALS — BP 136/72 | HR 65 | Ht 63.0 in | Wt 141.5 lb

## 2018-07-06 DIAGNOSIS — Z23 Encounter for immunization: Secondary | ICD-10-CM

## 2018-07-06 DIAGNOSIS — Z1211 Encounter for screening for malignant neoplasm of colon: Secondary | ICD-10-CM | POA: Diagnosis not present

## 2018-07-06 DIAGNOSIS — Z0000 Encounter for general adult medical examination without abnormal findings: Secondary | ICD-10-CM | POA: Diagnosis not present

## 2018-07-06 NOTE — Patient Instructions (Addendum)
Schedule GI appt  Bring a copy of your living will and/or healthcare power of attorney to your next office visit.  Continue doing brain stimulating activities (puzzles, reading, adult coloring books, staying active) to keep memory sharp.   Health Maintenance, Female Adopting a healthy lifestyle and getting preventive care can go a long way to promote health and wellness. Talk with your health care provider about what schedule of regular examinations is right for you. This is a good chance for you to check in with your provider about disease prevention and staying healthy. In between checkups, there are plenty of things you can do on your own. Experts have done a lot of research about which lifestyle changes and preventive measures are most likely to keep you healthy. Ask your health care provider for more information. Weight and diet Eat a healthy diet  Be sure to include plenty of vegetables, fruits, low-fat dairy products, and lean protein.  Do not eat a lot of foods high in solid fats, added sugars, or salt.  Get regular exercise. This is one of the most important things you can do for your health. ? Most adults should exercise for at least 150 minutes each week. The exercise should increase your heart rate and make you sweat (moderate-intensity exercise). ? Most adults should also do strengthening exercises at least twice a week. This is in addition to the moderate-intensity exercise.  Maintain a healthy weight  Body mass index (BMI) is a measurement that can be used to identify possible weight problems. It estimates body fat based on height and weight. Your health care provider can help determine your BMI and help you achieve or maintain a healthy weight.  For females 59 years of age and older: ? A BMI below 18.5 is considered underweight. ? A BMI of 18.5 to 24.9 is normal. ? A BMI of 25 to 29.9 is considered overweight. ? A BMI of 30 and above is considered obese.  Watch levels of  cholesterol and blood lipids  You should start having your blood tested for lipids and cholesterol at 66 years of age, then have this test every 5 years.  You may need to have your cholesterol levels checked more often if: ? Your lipid or cholesterol levels are high. ? You are older than 66 years of age. ? You are at high risk for heart disease.  Cancer screening Lung Cancer  Lung cancer screening is recommended for adults 24-10 years old who are at high risk for lung cancer because of a history of smoking.  A yearly low-dose CT scan of the lungs is recommended for people who: ? Currently smoke. ? Have quit within the past 15 years. ? Have at least a 30-pack-year history of smoking. A pack year is smoking an average of one pack of cigarettes a day for 1 year.  Yearly screening should continue until it has been 15 years since you quit.  Yearly screening should stop if you develop a health problem that would prevent you from having lung cancer treatment.  Breast Cancer  Practice breast self-awareness. This means understanding how your breasts normally appear and feel.  It also means doing regular breast self-exams. Let your health care provider know about any changes, no matter how small.  If you are in your 20s or 30s, you should have a clinical breast exam (CBE) by a health care provider every 1-3 years as part of a regular health exam.  If you are 40 or older,  have a CBE every year. Also consider having a breast X-ray (mammogram) every year.  If you have a family history of breast cancer, talk to your health care provider about genetic screening.  If you are at high risk for breast cancer, talk to your health care provider about having an MRI and a mammogram every year.  Breast cancer gene (BRCA) assessment is recommended for women who have family members with BRCA-related cancers. BRCA-related cancers include: ? Breast. ? Ovarian. ? Tubal. ? Peritoneal cancers.  Results  of the assessment will determine the need for genetic counseling and BRCA1 and BRCA2 testing.  Cervical Cancer Your health care provider may recommend that you be screened regularly for cancer of the pelvic organs (ovaries, uterus, and vagina). This screening involves a pelvic examination, including checking for microscopic changes to the surface of your cervix (Pap test). You may be encouraged to have this screening done every 3 years, beginning at age 72.  For women ages 59-65, health care providers may recommend pelvic exams and Pap testing every 3 years, or they may recommend the Pap and pelvic exam, combined with testing for human papilloma virus (HPV), every 5 years. Some types of HPV increase your risk of cervical cancer. Testing for HPV may also be done on women of any age with unclear Pap test results.  Other health care providers may not recommend any screening for nonpregnant women who are considered low risk for pelvic cancer and who do not have symptoms. Ask your health care provider if a screening pelvic exam is right for you.  If you have had past treatment for cervical cancer or a condition that could lead to cancer, you need Pap tests and screening for cancer for at least 20 years after your treatment. If Pap tests have been discontinued, your risk factors (such as having a new sexual partner) need to be reassessed to determine if screening should resume. Some women have medical problems that increase the chance of getting cervical cancer. In these cases, your health care provider may recommend more frequent screening and Pap tests.  Colorectal Cancer  This type of cancer can be detected and often prevented.  Routine colorectal cancer screening usually begins at 65 years of age and continues through 66 years of age.  Your health care provider may recommend screening at an earlier age if you have risk factors for colon cancer.  Your health care provider may also recommend using  home test kits to check for hidden blood in the stool.  A small camera at the end of a tube can be used to examine your colon directly (sigmoidoscopy or colonoscopy). This is done to check for the earliest forms of colorectal cancer.  Routine screening usually begins at age 71.  Direct examination of the colon should be repeated every 5-10 years through 66 years of age. However, you may need to be screened more often if early forms of precancerous polyps or small growths are found.  Skin Cancer  Check your skin from head to toe regularly.  Tell your health care provider about any new moles or changes in moles, especially if there is a change in a mole's shape or color.  Also tell your health care provider if you have a mole that is larger than the size of a pencil eraser.  Always use sunscreen. Apply sunscreen liberally and repeatedly throughout the day.  Protect yourself by wearing long sleeves, pants, a wide-brimmed hat, and sunglasses whenever you are outside.  Heart disease, diabetes, and high blood pressure  High blood pressure causes heart disease and increases the risk of stroke. High blood pressure is more likely to develop in: ? People who have blood pressure in the high end of the normal range (130-139/85-89 mm Hg). ? People who are overweight or obese. ? People who are African American.  If you are 13-66 years of age, have your blood pressure checked every 3-5 years. If you are 65 years of age or older, have your blood pressure checked every year. You should have your blood pressure measured twice-once when you are at a hospital or clinic, and once when you are not at a hospital or clinic. Record the average of the two measurements. To check your blood pressure when you are not at a hospital or clinic, you can use: ? An automated blood pressure machine at a pharmacy. ? A home blood pressure monitor.  If you are between 3 years and 60 years old, ask your health care provider  if you should take aspirin to prevent strokes.  Have regular diabetes screenings. This involves taking a blood sample to check your fasting blood sugar level. ? If you are at a normal weight and have a low risk for diabetes, have this test once every three years after 66 years of age. ? If you are overweight and have a high risk for diabetes, consider being tested at a younger age or more often. Preventing infection Hepatitis B  If you have a higher risk for hepatitis B, you should be screened for this virus. You are considered at high risk for hepatitis B if: ? You were born in a country where hepatitis B is common. Ask your health care provider which countries are considered high risk. ? Your parents were born in a high-risk country, and you have not been immunized against hepatitis B (hepatitis B vaccine). ? You have HIV or AIDS. ? You use needles to inject street drugs. ? You live with someone who has hepatitis B. ? You have had sex with someone who has hepatitis B. ? You get hemodialysis treatment. ? You take certain medicines for conditions, including cancer, organ transplantation, and autoimmune conditions.  Hepatitis C  Blood testing is recommended for: ? Everyone born from [birth year] through 1964. ? Anyone with known risk factors for hepatitis C.  Sexually transmitted infections (STIs)  You should be screened for sexually transmitted infections (STIs) including gonorrhea and chlamydia if: ? You are sexually active and are younger than 66 years of age. ? You are older than 66 years of age and your health care provider tells you that you are at risk for this type of infection. ? Your sexual activity has changed since you were last screened and you are at an increased risk for chlamydia or gonorrhea. Ask your health care provider if you are at risk.  If you do not have HIV, but are at risk, it may be recommended that you take a prescription medicine daily to prevent HIV infection. This  is called pre-exposure prophylaxis (PrEP). You are considered at risk if: ? You are sexually active and do not regularly use condoms or know the HIV status of your partner(s). ? You take drugs by injection. ? You are sexually active with a partner who has HIV.  Talk with your health care provider about whether you are at high risk of being infected with HIV. If you choose to begin PrEP, you should first be tested for HIV.  You should then be tested every 3 months for as long as you are taking PrEP. Pregnancy  If you are premenopausal and you may become pregnant, ask your health care provider about preconception counseling.  If you may become pregnant, take 400 to 800 micrograms (mcg) of folic acid every day.  If you want to prevent pregnancy, talk to your health care provider about birth control (contraception). Osteoporosis and menopause  Osteoporosis is a disease in which the bones lose minerals and strength with aging. This can result in serious bone fractures. Your risk for osteoporosis can be identified using a bone density scan.  If you are 11 years of age or older, or if you are at risk for osteoporosis and fractures, ask your health care provider if you should be screened.  Ask your health care provider whether you should take a calcium or vitamin D supplement to lower your risk for osteoporosis.  Menopause may have certain physical symptoms and risks.  Hormone replacement therapy may reduce some of these symptoms and risks. Talk to your health care provider about whether hormone replacement therapy is right for you. Follow these instructions at home:  Schedule regular health, dental, and eye exams.  Stay current with your immunizations.  Do not use any tobacco products including cigarettes, chewing tobacco, or electronic cigarettes.  If you are pregnant, do not drink alcohol.  If you are breastfeeding, limit how much and how often you drink alcohol.  Limit alcohol intake  to no more than 1 drink per day for nonpregnant women. One drink equals 12 ounces of beer, 5 ounces of wine, or 1 ounces of hard liquor.  Do not use street drugs.  Do not share needles.  Ask your health care provider for help if you need support or information about quitting drugs.  Tell your health care provider if you often feel depressed.  Tell your health care provider if you have ever been abused or do not feel safe at home. This information is not intended to replace advice given to you by your health care provider. Make sure you discuss any questions you have with your health care provider. Document Released: 03/10/2011 Document Revised: 01/31/2016 Document Reviewed: 05/29/2015 Elsevier Interactive Patient Education  [employer].

## 2018-07-23 ENCOUNTER — Encounter: Payer: Self-pay | Admitting: Family Medicine

## 2018-07-23 NOTE — Telephone Encounter (Signed)
Please call patient to clarify her exposure:  Did she have direct contact with the infected person?, ie, spent time within 3 feet of the infected coworker or have direct contact with infected coworker's cough droplets or nasal secretions?  If no, then she is ok and should watch for symptoms of pertussis over the next 3 weeks: fever, cough, respiratory illness.   IF yes, then a zpak is recommended.  Can route to [place] if we need to order this.   If she has not had direct exposure then nothing further is needed at this time.

## 2018-07-23 NOTE — Telephone Encounter (Signed)
FYI does pt need any additional workup. Will verify with pt which vaccine she received.

## 2018-08-26 ENCOUNTER — Encounter: Payer: PPO | Admitting: Family Medicine

## 2018-09-30 ENCOUNTER — Other Ambulatory Visit: Payer: Self-pay

## 2018-09-30 ENCOUNTER — Ambulatory Visit (INDEPENDENT_AMBULATORY_CARE_PROVIDER_SITE_OTHER): Payer: PPO | Admitting: Family Medicine

## 2018-09-30 ENCOUNTER — Encounter: Payer: Self-pay | Admitting: Family Medicine

## 2018-09-30 VITALS — BP 134/80 | HR 73 | Temp 98.1°F | Resp 16 | Ht 63.0 in | Wt 143.2 lb

## 2018-09-30 DIAGNOSIS — M25562 Pain in left knee: Secondary | ICD-10-CM

## 2018-09-30 NOTE — Progress Notes (Signed)
   Subjective:    Patient ID: Sylvia Stevens, female    DOB: 1951-04-01, 68 y.o.   MRN: [id number]  HPI Knee pain- 'my knee is killing me'.  L knee.  Pt reports she found a lump behind her knee on Saturday.  Painful w/ standing, unable to flex completely.  sxs started ~1 month ago.  No obvious swelling anteriorly.  Some relief w/ heat- this also decreased size of lump.  No recent injury.   Review of Systems For ROS see HPI     Objective:   Physical Exam Vitals signs reviewed.  Constitutional:      Appearance: Normal appearance.  Cardiovascular:     Pulses: Normal pulses.  Musculoskeletal:        General: Deformity (pt w/ large, mobile soft tissue mass posterior to L knee) present.     Comments: Limited flexion of L knee  Skin:    General: Skin is warm and dry.     Findings: No erythema or rash.  Neurological:     General: No focal deficit present.     Mental Status: She is alert and oriented to person, place, and time.  Psychiatric:        Mood and Affect: Mood normal.        Behavior: Behavior normal.        Thought Content: Thought content normal.           Assessment & Plan:  Posterior knee pain- new.  Pt w/ obvious soft tissue mass posterior to L knee.  Limited knee flexion and pain w/ standing.  Mild TTP.  Most consistent w/ Baker's cyst but due to limited ROM, will need ortho to assess and tx.  Pt expressed understanding and is in agreement w/ plan.

## 2018-09-30 NOTE — Patient Instructions (Signed)
Follow up as needed or as scheduled We'll call you with your Ortho appt Continue to heat!!! Ibuprofen as needed for pain/swelling Call with any questions or concerns Happy New Year!!!

## 2018-10-07 DIAGNOSIS — M25562 Pain in left knee: Secondary | ICD-10-CM | POA: Diagnosis not present

## 2018-10-07 DIAGNOSIS — M238X2 Other internal derangements of left knee: Secondary | ICD-10-CM | POA: Diagnosis not present

## 2018-10-15 DIAGNOSIS — M25562 Pain in left knee: Secondary | ICD-10-CM | POA: Diagnosis not present

## 2018-10-28 DIAGNOSIS — M25562 Pain in left knee: Secondary | ICD-10-CM | POA: Diagnosis not present

## 2018-10-28 DIAGNOSIS — S83282A Other tear of lateral meniscus, current injury, left knee, initial encounter: Secondary | ICD-10-CM | POA: Diagnosis not present

## 2018-10-28 DIAGNOSIS — M1712 Unilateral primary osteoarthritis, left knee: Secondary | ICD-10-CM | POA: Diagnosis not present

## 2018-11-16 ENCOUNTER — Encounter: Payer: Self-pay | Admitting: Family Medicine

## 2018-12-13 ENCOUNTER — Encounter: Payer: PPO | Admitting: Family Medicine

## 2019-01-14 ENCOUNTER — Ambulatory Visit (INDEPENDENT_AMBULATORY_CARE_PROVIDER_SITE_OTHER): Payer: PPO | Admitting: Internal Medicine

## 2019-01-14 ENCOUNTER — Encounter: Payer: Self-pay | Admitting: Internal Medicine

## 2019-01-14 DIAGNOSIS — E039 Hypothyroidism, unspecified: Secondary | ICD-10-CM | POA: Diagnosis not present

## 2019-01-14 DIAGNOSIS — E559 Vitamin D deficiency, unspecified: Secondary | ICD-10-CM

## 2019-01-14 DIAGNOSIS — E538 Deficiency of other specified B group vitamins: Secondary | ICD-10-CM

## 2019-01-14 NOTE — Progress Notes (Signed)
Subjective:     Patient ID: Sylvia Stevens, female   DOB: [birth year], 68 y.o.   MRN: [id number]  Patient location: Home My location: Office  Referring Provider: Midge Minium, MD  I connected with the patient on 01/14/19 at  8:03 AM EDT by telephone and verified that I am speaking with the correct person.   I discussed the limitations of evaluation and management by telephone and the availability of in person appointments. The patient expressed understanding and agreed to proceed.   Details of the encounter are shown below.  HPI Sylvia Stevens is a 68 y.o.woman, recently for f/u for well controlled hypothyroidism, dx 2008, and also vitamin B12 and D deficiencies. Last visit was 1 year ago.  She has well-controlled hypothyroidism, on stable dose of levothyroxine.  Pt is on levothyroxine 88 Mcg daily, taken: - in am - fasting - at least 1h from b'fast - no Fe, MVI - + Ca with dinner (not consistently) - + Only occasional PPIs later in the day - not on Biotin  Review TFTs: Lab Results  Component Value Date   TSH 3.14 01/13/2018   TSH 1.56 04/28/2017   TSH 2.53 12/25/2016   TSH 2.07 04/25/2016   TSH 1.41 12/26/2015   TSH 1.31 03/29/2015   TSH 2.61 12/22/2014   TSH 0.74 03/27/2014   TSH 1.32 12/01/2013   TSH 0.66 06/03/2013   FREET4 1.07 01/13/2018   FREET4 1.15 12/25/2016   FREET4 1.13 12/26/2015   FREET4 0.98 12/22/2014   FREET4 1.06 12/01/2013   FREET4 1.11 06/03/2013   FREET4 1.11 02/04/2013   FREET4 1.51 11/16/2012   FREET4 1.38 08/18/2012   No FH of thyroid ds. No FH of thyroid cancer. No h/o radiation tx to head or neck.  No herbal supplements. No Biotin use. No recent steroids use.   She also has a history of hypertension, osteopenia, osteoarthritis, bursitis. She has a Baker's cyst. On Diclofenac.  We checked her vitamin levels due to her complaints of fatigue.  A B12 level was found to be low in the normal range so we started her on supplementation.  At last  visit we decreased the dose to 1000 mcg daily (she is actually taking 2500 once a week).  At that time, B12 level was still high at goal. Lab Results  Component Value Date   VITAMINB12 >1500 (H) 01/13/2018   VITAMINB12 >1500 (H) 04/28/2017   VITAMINB12 214 12/25/2016   She also has a history of vitamin D deficiency.  Latest levels were normal on 1000 units daily: Lab Results  Component Value Date   VD25OH 41.98 01/13/2018   VD25OH 45.14 04/28/2017   VD25OH 37.63 04/25/2016   VD25OH 45.45 03/29/2015   VD25OH 53.36 03/27/2014   VD25OH 28 (L) 12/01/2013    Also takes fish oil.  Before last visit, her husband was diagnosed with breast cancer. He had Rx Tx.  Review of Systems Constitutional: no weight gain/no weight loss, no fatigue, no subjective hyperthermia, no subjective hypothermia Eyes: no blurry vision, no xerophthalmia ENT: no sore throat, + see HPI Cardiovascular: no CP/no SOB/no palpitations/no leg swelling Respiratory: no cough/no SOB/no wheezing Gastrointestinal: no N/no V/no D/no C/no acid reflux Musculoskeletal: no muscle aches/+ joint aches Skin: no rashes, no hair loss Neurological: no tremors/no numbness/no tingling/no dizziness  I reviewed pt's medications, allergies, PMH, social hx, family hx, and changes were documented in the history of present illness. Otherwise, unchanged from my initial visit note.  Past Medical History:  Diagnosis Date  . Arthritis   . Endometrial polyp   . Hypertension   . Osteopenia 09/16/2012   T score -1.3 FRAX 7.6%/0.9%  . Thyroid disease    hypothyroid   Past Surgical History:  Procedure Laterality Date  . APPENDECTOMY    . Bladder Bx    . CARPAL TUNNEL RELEASE     X 2  . CESAREAN SECTION     X 2  . HERNIA REPAIR    . TOE SURGERY    . TUBAL LIGATION     Social History   Socioeconomic History  . Marital status: Married    Spouse name: Not on file  . Number of children: Not on file  . Years of education: Not on file   . Highest education level: Not on file  Occupational History  . Not on file  Social Needs  . Financial resource strain: Not on file  . Food insecurity:    Worry: Not on file    Inability: Not on file  . Transportation needs:    Medical: Not on file    Non-medical: Not on file  Tobacco Use  . Smoking status: Current Every Day Smoker    Packs/day: 0.50    Types: Cigarettes  . Smokeless tobacco: Never Used  Substance and Sexual Activity  . Alcohol use: Yes    Alcohol/week: 0.0 standard drinks    Comment: rare  . Drug use: No  . Sexual activity: Never    Birth control/protection: Surgical  Lifestyle  . Physical activity:    Days per week: Not on file    Minutes per session: Not on file  . Stress: Not on file  Relationships  . Social connections:    Talks on phone: Not on file    Gets together: Not on file    Attends religious service: Not on file    Active member of club or organization: Not on file    Attends meetings of clubs or organizations: Not on file    Relationship status: Not on file  . Intimate partner violence:    Fear of current or ex partner: Not on file    Emotionally abused: Not on file    Physically abused: Not on file    Forced sexual activity: Not on file  Other Topics Concern  . Not on file  Social History Narrative   Lives with husband in a one story home.  Has 2 children.  Works part time as an Biochemist, clinical.  Education: some college.   Current Outpatient Medications on File Prior to Visit  Medication Sig Dispense Refill  . aspirin 81 MG tablet Take 81 mg by mouth daily.      . Calcium Carbonate-Vit D-Min (CALCIUM 1200 PO) Take 1 capsule by mouth every evening.     . cholecalciferol (VITAMIN D) 1000 UNITS tablet Take 2,000 Units by mouth daily.     . Cyanocobalamin (VITAMIN B-12) 5000 MCG SUBL Place 1 tablet under the tongue daily.    . Fish Oil-Cholecalciferol (FISH OIL + D3) 1200-1000 MG-UNIT CAPS Take by mouth.      . levothyroxine  (SYNTHROID, LEVOTHROID) 88 MCG tablet TAKE 1 TABLET BY MOUTH EVERY MORNING BEFORE BREAKFAST ON AN EMPTY STOMACH 90 tablet 3   No current facility-administered medications on file prior to visit.    Allergies  Allergen Reactions  . Atorvastatin Hives  . Lobster [Shellfish Allergy] Hives  . Penicillins Hives  . Strawberry Extract Hives  . Erythromycin  Other (See Comments)    Face feels like it is sunburned.   . Flexeril [Cyclobenzaprine]     Vomit   . Omnipaque [Iohexol]    Family History  Problem Relation Age of Onset  . Alzheimer's disease Mother   . Heart disease Father   . Pancreatic cancer Father   . Arthritis Father   . Hyperlipidemia Father   . Hypertension Father   . Cancer Paternal Aunt        Ovarian or Uterine cancer  . Aneurysm Paternal Uncle     Objective:   Physical Exam There were no vitals taken for this visit. There is no height or weight on file to calculate BMI. Wt Readings from Last 3 Encounters:  09/30/18 143 lb 4 oz (65 kg)  07/06/18 141 lb 8 oz (64.2 kg)  06/04/18 136 lb 8 oz (61.9 kg)   Constitutional:  in NAD  The physical exam was not performed (telephone visit).  Assessment:     1. Hypothyroidism  - on  stable  dose of levothyroxine generic since diagnosis  2.  Low vitamin B12  3.  Vitamin D deficiency     Plan:     1. Patient with long history of controlled hypothyroidism, on generic levothyroxine. - latest thyroid labs reviewed with pt >> normal at last visit - she continues on LT4 88 mcg daily - pt feels good on this dose. - we discussed about taking the thyroid hormone every day, with water, >30 minutes before breakfast, separated by >4 hours from acid reflux medications, calcium, iron, multivitamins. Pt. is taking it correctly. - will check thyroid tests when she returns to the clinic (now coronavirus pandemic): TSH and fT4 - If labs are abnormal, she will need to return for repeat TFTs in 1.5 months - RTC in 1 year  2.  Low  vitamin B12 -Vitamin B12 was low normal and we started 5000 mcg vitamin B12 p.o. daily. -Subsequent B12 level was high so we changed to 5000 mcg every other day summer 2018 -At last visit, B12 level was still high, so I advised her to decrease the dose to 1000 mcg daily.  She tells me she is taking 200 mcg a week. -We will recheck level when she returns to the clinic  3.  Vitamin D deficiency  -Reviewed vitamin D level from last visit: Normal -Continue 1000 units vitamin D daily -We will recheck level when she returns to the clinic  Orders Placed This Encounter  Procedures  . TSH  . T4, free  . VITAMIN D 25 Hydroxy (Vit-D Deficiency, Fractures)  . Vitamin B12   - time spent with the patient: 14 min, of which >50% was spent in obtaining information about her symptoms, reviewing her previous labs, evaluations, and treatments, counseling her about her condition (please see the discussed topics above), and developing a plan to further investigate and treat it; she had a number of questions which I addressed.  Philemon Kingdom, MD PhD [facility]

## 2019-01-14 NOTE — Patient Instructions (Signed)
Please come in for labs when safe.  Continue Levothyroxine 88 mcg daily.  Take the thyroid hormone every day, with water, at least 30 minutes before breakfast, separated by at least 4 hours from: - acid reflux medications - calcium - iron - multivitamins  Please return in 1 year.

## 2019-02-07 ENCOUNTER — Other Ambulatory Visit: Payer: Self-pay | Admitting: Internal Medicine

## 2019-04-22 ENCOUNTER — Encounter: Payer: Self-pay | Admitting: Family Medicine

## 2019-04-22 ENCOUNTER — Ambulatory Visit (INDEPENDENT_AMBULATORY_CARE_PROVIDER_SITE_OTHER): Payer: PPO | Admitting: Family Medicine

## 2019-04-22 ENCOUNTER — Other Ambulatory Visit: Payer: Self-pay

## 2019-04-22 VITALS — BP 133/81 | HR 76 | Temp 98.3°F | Resp 16 | Ht 63.0 in | Wt 136.2 lb

## 2019-04-22 DIAGNOSIS — E538 Deficiency of other specified B group vitamins: Secondary | ICD-10-CM | POA: Diagnosis not present

## 2019-04-22 DIAGNOSIS — E559 Vitamin D deficiency, unspecified: Secondary | ICD-10-CM | POA: Diagnosis not present

## 2019-04-22 DIAGNOSIS — Z0000 Encounter for general adult medical examination without abnormal findings: Secondary | ICD-10-CM | POA: Diagnosis not present

## 2019-04-22 DIAGNOSIS — R234 Changes in skin texture: Secondary | ICD-10-CM | POA: Diagnosis not present

## 2019-04-22 DIAGNOSIS — E039 Hypothyroidism, unspecified: Secondary | ICD-10-CM | POA: Diagnosis not present

## 2019-04-22 LAB — CBC WITH DIFFERENTIAL/PLATELET
Basophils Absolute: 0 10*3/uL (ref 0.0–0.1)
Basophils Relative: 0.1 % (ref 0.0–3.0)
Eosinophils Absolute: 0.1 10*3/uL (ref 0.0–0.7)
Eosinophils Relative: 2 % (ref 0.0–5.0)
HCT: 42.9 % (ref 36.0–46.0)
Hemoglobin: 14.2 g/dL (ref 12.0–15.0)
Lymphocytes Relative: 23.6 % (ref 12.0–46.0)
Lymphs Abs: 1.4 10*3/uL (ref 0.7–4.0)
MCHC: 33.1 g/dL (ref 30.0–36.0)
MCV: 95.2 fl (ref 78.0–100.0)
Monocytes Absolute: 0.4 10*3/uL (ref 0.1–1.0)
Monocytes Relative: 7.7 % (ref 3.0–12.0)
Neutro Abs: 3.9 10*3/uL (ref 1.4–7.7)
Neutrophils Relative %: 66.6 % (ref 43.0–77.0)
Platelets: 260 10*3/uL (ref 150.0–400.0)
RBC: 4.51 Mil/uL (ref 3.87–5.11)
RDW: 14 % (ref 11.5–15.5)
WBC: 5.8 10*3/uL (ref 4.0–10.5)

## 2019-04-22 LAB — LIPID PANEL
Cholesterol: 208 mg/dL — ABNORMAL HIGH (ref 0–200)
HDL: 48 mg/dL (ref >39.00)
LDL Cholesterol: 138 mg/dL — ABNORMAL HIGH (ref 0–99)
NonHDL: 159.98
Total CHOL/HDL Ratio: 4
Triglycerides: 110 mg/dL (ref 0.0–149.0)
VLDL: 22 mg/dL (ref 0.0–40.0)

## 2019-04-22 LAB — BASIC METABOLIC PANEL
BUN: 14 mg/dL (ref 6–23)
CO2: 30 mEq/L (ref 19–32)
Calcium: 9.6 mg/dL (ref 8.4–10.5)
Chloride: 105 mEq/L (ref 96–112)
Creatinine, Ser: 0.87 mg/dL (ref 0.40–1.20)
GFR: 64.86 mL/min (ref >60.00)
Glucose, Bld: 88 mg/dL (ref 70–99)
Potassium: 4 mEq/L (ref 3.5–5.1)
Sodium: 142 mEq/L (ref 135–145)

## 2019-04-22 LAB — HEPATIC FUNCTION PANEL
ALT: 18 U/L (ref 0–35)
AST: 18 U/L (ref 0–37)
Albumin: 4.3 g/dL (ref 3.5–5.2)
Alkaline Phosphatase: 68 U/L (ref 39–117)
Bilirubin, Direct: 0.1 mg/dL (ref 0.0–0.3)
Total Bilirubin: 0.6 mg/dL (ref 0.2–1.2)
Total Protein: 6.5 g/dL (ref 6.0–8.3)

## 2019-04-22 LAB — TSH: TSH: 1.18 u[IU]/mL (ref 0.35–4.50)

## 2019-04-22 LAB — VITAMIN D 25 HYDROXY (VIT D DEFICIENCY, FRACTURES): VITD: 38.77 ng/mL (ref 30.00–100.00)

## 2019-04-22 LAB — VITAMIN B12: Vitamin B-12: >1500 pg/mL — ABNORMAL HIGH (ref 211–911)

## 2019-04-22 MED ORDER — MUPIROCIN 2 % EX OINT: 1.0000 "application " | TOPICAL_OINTMENT | Freq: Two times a day (BID) | CUTANEOUS | 0 refills | Status: DC

## 2019-04-22 NOTE — Assessment & Plan Note (Signed)
Pt has hx of this.  Check labs and replete prn. 

## 2019-04-22 NOTE — Assessment & Plan Note (Signed)
Pt's PE WNL w/ exception of L ear fissure.  UTD on mammo, colonoscopy, immunizations.  Check labs.  Anticipatory guidance provided.

## 2019-04-22 NOTE — Patient Instructions (Signed)
Follow up in 1 year or as needed We'll notify you of your lab results and make any changes if needed Keep up the good work on healthy diet and regular exercise- you can do it! Schedule your mammogram this fall Call with any questions or concerns Stay Safe!!

## 2019-04-22 NOTE — Progress Notes (Signed)
   Subjective:    Patient ID: Sylvia Stevens, female    DOB: [birth year], 68 y.o.   MRN: [id number]  HPI CPE- UTD on mammo, colonoscopy, immunizations.  No concerns   Review of Systems Patient reports no vision/ hearing changes, adenopathy,fever, weight change,  persistant/recurrent hoarseness , swallowing issues, chest pain, palpitations, edema, persistant/recurrent cough, hemoptysis, dyspnea (rest/exertional/paroxysmal nocturnal), gastrointestinal bleeding (melena, rectal bleeding), abdominal pain, significant heartburn, bowel changes, GU symptoms (dysuria, hematuria, incontinence), Gyn symptoms (abnormal  bleeding, pain),  syncope, focal weakness, memory loss, numbness & tingling, skin/hair/nail changes, abnormal bruising or bleeding, anxiety, or depression.    Objective:   Physical Exam General Appearance:    Alert, cooperative, no distress, appears stated age  Head:    Normocephalic, without obvious abnormality, atraumatic  Eyes:    PERRL, conjunctiva/corneas clear, EOM's intact, fundi    benign, both eyes  Ears:    Normal TM's and external ear canals, both ears.  Fissure of L pinna  Nose:   Deferred due to COVID  Throat:   Neck:   Supple, symmetrical, trachea midline, no adenopathy;    Thyroid: no enlargement/tenderness/nodules  Back:     Symmetric, no curvature, ROM normal, no CVA tenderness  Lungs:     Clear to auscultation bilaterally, respirations unlabored  Chest Wall:    No tenderness or deformity   Heart:    Regular rate and rhythm, S1 and S2 normal, no murmur, rub   or gallop  Breast Exam:    Deferred to mammo  Abdomen:     Soft, non-tender, bowel sounds active all four quadrants,    no masses, no organomegaly  Genitalia:    Deferred  Rectal:    Extremities:   Extremities normal, atraumatic, no cyanosis or edema  Pulses:   2+ and symmetric all extremities  Skin:   Skin color, texture, turgor normal, no rashes or lesions  Lymph nodes:   Cervical, supraclavicular, and  axillary nodes normal  Neurologic:   CNII-XII intact, normal strength, sensation and reflexes    throughout          Assessment & Plan:

## 2019-04-22 NOTE — Assessment & Plan Note (Signed)
Chronic problem.  Following w/ Dr Cruzita Lederer but due for labs today as her Endo visit was virtual.

## 2019-06-30 DIAGNOSIS — Z1231 Encounter for screening mammogram for malignant neoplasm of breast: Secondary | ICD-10-CM | POA: Diagnosis not present

## 2019-06-30 LAB — HM MAMMOGRAPHY

## 2019-07-22 ENCOUNTER — Encounter: Payer: Self-pay | Admitting: General Practice

## 2019-10-06 ENCOUNTER — Ambulatory Visit: Payer: PPO

## 2019-10-14 ENCOUNTER — Ambulatory Visit: Payer: PPO | Attending: Internal Medicine

## 2019-10-14 DIAGNOSIS — Z23 Encounter for immunization: Secondary | ICD-10-CM | POA: Insufficient documentation

## 2019-10-14 NOTE — Progress Notes (Signed)
   Covid-19 Vaccination Clinic  Name:  Marriana Shetley    MRN: [id number] DOB: 01/07/1951  10/14/2019  Ms. Santa was observed post Covid-19 immunization for 15 minutes without incidence. She was provided with Vaccine Information Sheet and instruction to access the V-Safe system.   Ms. Gash was instructed to call 911 with any severe reactions post vaccine: Marland Kitchen Difficulty breathing  . Swelling of your face and throat  . A fast heartbeat  . A bad rash all over your body  . Dizziness and weakness    Immunizations Administered    Name Date Dose VIS Date Route   Pfizer COVID-19 Vaccine 10/14/2019  1:54 PM 0.3 mL 08/19/2019 Intramuscular   Manufacturer: [place]   Lot: [id number]   [place]: [id number]

## 2019-10-27 ENCOUNTER — Ambulatory Visit: Payer: PPO

## 2019-11-09 ENCOUNTER — Ambulatory Visit: Payer: PPO | Attending: Internal Medicine

## 2019-11-09 DIAGNOSIS — Z23 Encounter for immunization: Secondary | ICD-10-CM

## 2019-11-09 NOTE — Progress Notes (Signed)
   Covid-19 Vaccination Clinic  Name:  Sylvia Stevens    MRN: [id number] DOB: 1951/06/29  11/09/2019  Sylvia Stevens was observed post Covid-19 immunization for 15 minutes without incident. She was provided with Vaccine Information Sheet and instruction to access the V-Safe system.   Sylvia Stevens was instructed to call 911 with any severe reactions post vaccine: Marland Kitchen Difficulty breathing  . Swelling of face and throat  . A fast heartbeat  . A bad rash all over body  . Dizziness and weakness   Immunizations Administered    Name Date Dose VIS Date Route   Pfizer COVID-19 Vaccine 11/09/2019  8:16 AM 0.3 mL 08/19/2019 Intramuscular   Manufacturer: [place]   Lot: [id number]   [place]: [id number]

## 2019-12-07 ENCOUNTER — Ambulatory Visit: Payer: PPO | Attending: Internal Medicine

## 2019-12-07 DIAGNOSIS — Z20822 Contact with and (suspected) exposure to covid-19: Secondary | ICD-10-CM

## 2019-12-08 LAB — NOVEL CORONAVIRUS, NAA: SARS-CoV-2, NAA: NOT DETECTED

## 2020-01-18 ENCOUNTER — Other Ambulatory Visit: Payer: Self-pay

## 2020-01-18 ENCOUNTER — Ambulatory Visit: Payer: PPO

## 2020-01-19 ENCOUNTER — Encounter: Payer: Self-pay | Admitting: Internal Medicine

## 2020-01-19 ENCOUNTER — Ambulatory Visit: Payer: PPO | Admitting: Internal Medicine

## 2020-01-19 VITALS — BP 140/80 | HR 61 | Ht 63.0 in | Wt 140.0 lb

## 2020-01-19 DIAGNOSIS — E538 Deficiency of other specified B group vitamins: Secondary | ICD-10-CM | POA: Diagnosis not present

## 2020-01-19 DIAGNOSIS — E559 Vitamin D deficiency, unspecified: Secondary | ICD-10-CM | POA: Diagnosis not present

## 2020-01-19 DIAGNOSIS — E039 Hypothyroidism, unspecified: Secondary | ICD-10-CM

## 2020-01-19 LAB — TSH: TSH: 1.2 u[IU]/mL (ref 0.35–4.50)

## 2020-01-19 LAB — T4, FREE: Free T4: 1.36 ng/dL (ref 0.60–1.60)

## 2020-01-19 LAB — VITAMIN B12: Vitamin B-12: 1308 pg/mL — ABNORMAL HIGH (ref 211–911)

## 2020-01-19 NOTE — Progress Notes (Signed)
Subjective:     Patient ID: Sylvia Stevens, female   DOB: August 04, 1951, 69 y.o.   MRN: [id number]  This visit occurred during the SARS-CoV-2 public health emergency.  Safety protocols were in place, including screening questions prior to the visit, additional usage of staff PPE, and extensive cleaning of exam room while observing appropriate contact time as indicated for disinfecting solutions.   HPI Sylvia Stevens is a 69 y.o.woman, recently for f/u for well controlled hypothyroidism, dx 2008, and also vitamin B12 and D deficiencies. Last visit was 1 year ago (virtual).  She has well-controlled hypothyroidism, on stable dose of levothyroxine.  Pt is on levothyroxine 88 mcg daily, taken: - in am - fasting - at least 30 min from b'fast - no Ca, Fe, MVI, + very seldom PPIs later in the day - not on Biotin  Reviewed TFTs: Lab Results  Component Value Date   TSH 1.18 04/22/2019   TSH 3.14 01/13/2018   TSH 1.56 04/28/2017   TSH 2.53 12/25/2016   TSH 2.07 04/25/2016   TSH 1.41 12/26/2015   TSH 1.31 03/29/2015   TSH 2.61 12/22/2014   TSH 0.74 03/27/2014   TSH 1.32 12/01/2013   FREET4 1.07 01/13/2018   FREET4 1.15 12/25/2016   FREET4 1.13 12/26/2015   FREET4 0.98 12/22/2014   FREET4 1.06 12/01/2013   FREET4 1.11 06/03/2013   FREET4 1.11 02/04/2013   FREET4 1.51 11/16/2012   FREET4 1.38 08/18/2012   No FH of thyroid ds. No FH of thyroid cancer. No h/o radiation tx to head or neck.  No herbal supplements. No Biotin use. No recent steroids use.   She also has a history of HTN, osteopenia, osteoarthritis, bursitis.   We checked her vitamin levels due to her complaints of fatigue.  A B12 level was found to be low in the normal range so we started her on supplementation.  Latest vitamin B12 was still higher than goal on only 200 mcg/week. At this visit, she tells me she went back to taking 2500 mcg daily: Lab Results  Component Value Date   VITAMINB12 >1500 (H) 04/22/2019   VITAMINB12 >1500  (H) 01/13/2018   VITAMINB12 >1500 (H) 04/28/2017   VITAMINB12 214 12/25/2016   She takes 1000 units vitamin D daily.  Latest vitamin D level was normal: Lab Results  Component Value Date   VD25OH 38.77 04/22/2019   VD25OH 41.98 01/13/2018   VD25OH 45.14 04/28/2017   VD25OH 37.63 04/25/2016   VD25OH 45.45 03/29/2015   VD25OH 53.36 03/27/2014   VD25OH 28 (L) 12/01/2013    She is also taking fish oil - occasionally.  Before last visit, her husband was diagnosed with breast cancer. He had Rx Tx.  Review of Systems Constitutional: no weight gain/no weight loss, no fatigue, no subjective hyperthermia, no subjective hypothermia Eyes: no blurry vision, no xerophthalmia ENT: no sore throat, + see HPI Cardiovascular: no CP/no SOB/no palpitations/no leg swelling Respiratory: no cough/no SOB/no wheezing Gastrointestinal: no N/no V/no D/no C/no acid reflux Musculoskeletal: no muscle aches/+ joint aches (knees -L>R) Skin: no rashes, no hair loss Neurological: no tremors/no numbness/no tingling/no dizziness  I reviewed pt's medications, allergies, PMH, social hx, family hx, and changes were documented in the history of present illness. Otherwise, unchanged from my initial visit note.  Past Medical History:  Diagnosis Date  . Arthritis   . Endometrial polyp   . Hypertension   . Osteopenia 09/16/2012   T score -1.3 FRAX 7.6%/0.9%  . Thyroid disease  hypothyroid   Past Surgical History:  Procedure Laterality Date  . APPENDECTOMY    . Bladder Bx    . CARPAL TUNNEL RELEASE     X 2  . CESAREAN SECTION     X 2  . HERNIA REPAIR    . TOE SURGERY    . TUBAL LIGATION     Social History   Socioeconomic History  . Marital status: Married    Spouse name: Not on file  . Number of children: Not on file  . Years of education: Not on file  . Highest education level: Not on file  Occupational History  . Not on file  Tobacco Use  . Smoking status: Current Every Day Smoker    Packs/day:  0.50    Types: Cigarettes  . Smokeless tobacco: Never Used  Substance and Sexual Activity  . Alcohol use: Yes    Alcohol/week: 0.0 standard drinks    Comment: rare  . Drug use: No  . Sexual activity: Never    Birth control/protection: Surgical  Other Topics Concern  . Not on file  Social History Narrative   Lives with husband in a one story home.  Has 2 children.  Works part time as an Biochemist, clinical.  Education: some college.   Social Determinants of Health   Financial Resource Strain:   . Difficulty of Paying Living Expenses:   Food Insecurity:   . Worried About Charity fundraiser in the Last Year:   . Arboriculturist in the Last Year:   Transportation Needs:   . Film/video editor (Medical):   Marland Kitchen Lack of Transportation (Non-Medical):   Physical Activity:   . Days of Exercise per Week:   . Minutes of Exercise per Session:   Stress:   . Feeling of Stress :   Social Connections:   . Frequency of Communication with Friends and Family:   . Frequency of Social Gatherings with Friends and Family:   . Attends Religious Services:   . Active Member of Clubs or Organizations:   . Attends Archivist Meetings:   Marland Kitchen Marital Status:   Intimate Partner Violence:   . Fear of Current or Ex-Partner:   . Emotionally Abused:   Marland Kitchen Physically Abused:   . Sexually Abused:    Current Outpatient Medications on File Prior to Visit  Medication Sig Dispense Refill  . aspirin 81 MG tablet Take 81 mg by mouth daily.      . Calcium Carbonate-Vit D-Min (CALCIUM 1200 PO) Take 1 capsule by mouth every evening.     . cholecalciferol (VITAMIN D) 1000 UNITS tablet Take 2,000 Units by mouth daily.     . Cyanocobalamin (VITAMIN B-12) 2500 MCG SUBL Place under the tongue.    . diclofenac (VOLTAREN) 25 MG EC tablet Take 25 mg by mouth 2 (two) times daily.    . Fish Oil-Cholecalciferol (FISH OIL + D3) 1200-1000 MG-UNIT CAPS Take by mouth.      . levothyroxine (SYNTHROID) 88 MCG tablet TAKE  1 TABLET BY MOUTH EVERY MORNING BEFORE BREAKFAST ON AN EMPTY STOMACH 90 tablet 3  . mupirocin ointment (BACTROBAN) 2 % Apply 1 application topically 2 (two) times daily. 30 g 0   No current facility-administered medications on file prior to visit.   Allergies  Allergen Reactions  . Atorvastatin Hives  . Lobster [Shellfish Allergy] Hives  . Penicillins Hives  . Strawberry Extract Hives  . Erythromycin Other (See Comments)    Face  feels like it is sunburned.   . Flexeril [Cyclobenzaprine]     Vomit   . Omnipaque [Iohexol]    Family History  Problem Relation Age of Onset  . Alzheimer's disease Mother   . Heart disease Father   . Pancreatic cancer Father   . Arthritis Father   . Hyperlipidemia Father   . Hypertension Father   . Cancer Paternal Aunt        Ovarian or Uterine cancer  . Aneurysm Paternal Uncle     Objective:   Physical Exam BP 140/80   Pulse 61   Ht 5\' 3"  (1.6 m)   Wt 140 lb (63.5 kg)   SpO2 94%   BMI 24.80 kg/m  Body mass index is 24.8 kg/m. Wt Readings from Last 3 Encounters:  01/19/20 140 lb (63.5 kg)  04/22/19 136 lb 4 oz (61.8 kg)  09/30/18 143 lb 4 oz (65 kg)   Constitutional: normal weight, in NAD Eyes: PERRLA, EOMI, no exophthalmos ENT: moist mucous membranes, no thyromegaly, no cervical lymphadenopathy Cardiovascular: RRR, No MRG Respiratory: CTA B Gastrointestinal: abdomen soft, NT, ND, BS+ Musculoskeletal: no deformities, strength intact in all 4 Skin: moist, warm, no rashes Neurological: no tremor with outstretched hands, DTR normal in all 4   Assessment:     1. Hypothyroidism  - on  stable  dose of levothyroxine generic since diagnosis  2.  Low vitamin B12  3.  Vitamin D deficiency     Plan:     1. Patient with long history of controlled hypothyroidism, on generic levothyroxine. - latest thyroid labs reviewed with pt >> normal: Lab Results  Component Value Date   TSH 1.18 04/22/2019   - she continues on LT4 88 mcg  daily - pt feels good on this dose. - we discussed about taking the thyroid hormone every day, with water, >30 minutes before breakfast, separated by >4 hours from acid reflux medications, calcium, iron, multivitamins. Pt. is taking it correctly. - will check thyroid tests today: TSH and fT4 - If labs are abnormal, she will need to return for repeat TFTs in 1.5 months - OTW, I will see her back in a year  2.  Low vitamin B12 -She had another B12 level in 04/2019, and the level was still high -We initially started 5000 mcg vitamin B12 p.o. daily, but the subsequent B12 level was high so we changed to 2500 mcg daily in summer 2018.  However, B12 level remains high so we decreased the dose to 1000 mcg daily.  However, at last visit, she was taking 200 mcg a week.  Since then, she tells me that she actually increase the dose to 2500 mcg daily... -We will recheck level today  3.  Vitamin D deficiency  -Latest vitamin D level was reviewed from 04/2019 and this was normal, at goal -She continues on 1000 units vitamin D daily  -We will recheck level today  Component     Latest Ref Rng & Units 01/19/2020  TSH     0.35 - 4.50 uIU/mL 1.20  T4,Free(Direct)     0.60 - 1.60 ng/dL 1.36  Vitamin B12     211 - 911 pg/mL 1,308 (H)  Vitamin D, 25-Hydroxy     30.0 - 100.0 ng/mL 22.4 (L)   Thyroid tests are normal.  Vitamin B12 is slightly high, while vitamin D slightly low.   I will advise her to decrease the dose of vitamin B12 to 1000 mcg daily and to increase  the dose of vitamin D to 2000 units daily and recheck her tests in 2 months.  Philemon Kingdom, MD PhD [facility] Endocrinology

## 2020-01-19 NOTE — Patient Instructions (Signed)
Please stop at the lab.  Continue Levothyroxine 88 mcg daily.  Take the thyroid hormone every day, with water, at least 30 minutes before breakfast, separated by at least 4 hours from: - acid reflux medications - calcium - iron - multivitamins  Please return in 1 year.

## 2020-01-20 LAB — VITAMIN D 25 HYDROXY (VIT D DEFICIENCY, FRACTURES): Vit D, 25-Hydroxy: 22.4 ng/mL — ABNORMAL LOW (ref 30.0–100.0)

## 2020-01-23 ENCOUNTER — Other Ambulatory Visit: Payer: Self-pay | Admitting: Internal Medicine

## 2020-02-27 ENCOUNTER — Telehealth: Payer: Self-pay | Admitting: Family Medicine

## 2020-02-27 NOTE — Telephone Encounter (Signed)
Left message for patient to schedule Annual Wellness Visit.  Please schedule with Nurse Health Advisor Martha Stanley, RN at [place]  

## 2020-04-23 ENCOUNTER — Other Ambulatory Visit: Payer: Self-pay

## 2020-04-23 ENCOUNTER — Ambulatory Visit (INDEPENDENT_AMBULATORY_CARE_PROVIDER_SITE_OTHER): Payer: PPO | Admitting: Family Medicine

## 2020-04-23 ENCOUNTER — Ambulatory Visit (INDEPENDENT_AMBULATORY_CARE_PROVIDER_SITE_OTHER): Payer: PPO

## 2020-04-23 ENCOUNTER — Encounter: Payer: Self-pay | Admitting: Family Medicine

## 2020-04-23 VITALS — BP 144/86 | HR 65 | Temp 99.2°F | Resp 16 | Ht 63.0 in | Wt 136.0 lb

## 2020-04-23 DIAGNOSIS — E538 Deficiency of other specified B group vitamins: Secondary | ICD-10-CM

## 2020-04-23 DIAGNOSIS — F32A Depression, unspecified: Secondary | ICD-10-CM | POA: Insufficient documentation

## 2020-04-23 DIAGNOSIS — I10 Essential (primary) hypertension: Secondary | ICD-10-CM | POA: Diagnosis not present

## 2020-04-23 DIAGNOSIS — Z0000 Encounter for general adult medical examination without abnormal findings: Secondary | ICD-10-CM | POA: Diagnosis not present

## 2020-04-23 DIAGNOSIS — E559 Vitamin D deficiency, unspecified: Secondary | ICD-10-CM | POA: Diagnosis not present

## 2020-04-23 DIAGNOSIS — F329 Major depressive disorder, single episode, unspecified: Secondary | ICD-10-CM

## 2020-04-23 LAB — BASIC METABOLIC PANEL
BUN: 16 mg/dL (ref 6–23)
CO2: 30 mEq/L (ref 19–32)
Calcium: 10 mg/dL (ref 8.4–10.5)
Chloride: 105 mEq/L (ref 96–112)
Creatinine, Ser: 0.95 mg/dL (ref 0.40–1.20)
GFR: 58.42 mL/min — ABNORMAL LOW (ref >60.00)
Glucose, Bld: 93 mg/dL (ref 70–99)
Potassium: 4.5 mEq/L (ref 3.5–5.1)
Sodium: 142 mEq/L (ref 135–145)

## 2020-04-23 LAB — LIPID PANEL
Cholesterol: 214 mg/dL — ABNORMAL HIGH (ref 0–200)
HDL: 51.9 mg/dL (ref >39.00)
LDL Cholesterol: 137 mg/dL — ABNORMAL HIGH (ref 0–99)
NonHDL: 162.05
Total CHOL/HDL Ratio: 4
Triglycerides: 124 mg/dL (ref 0.0–149.0)
VLDL: 24.8 mg/dL (ref 0.0–40.0)

## 2020-04-23 LAB — CBC WITH DIFFERENTIAL/PLATELET
Basophils Absolute: 0.1 10*3/uL (ref 0.0–0.1)
Basophils Relative: 1.5 % (ref 0.0–3.0)
Eosinophils Absolute: 0.1 10*3/uL (ref 0.0–0.7)
Eosinophils Relative: 1.4 % (ref 0.0–5.0)
HCT: 44 % (ref 36.0–46.0)
Hemoglobin: 14.6 g/dL (ref 12.0–15.0)
Lymphocytes Relative: 23.7 % (ref 12.0–46.0)
Lymphs Abs: 1.5 10*3/uL (ref 0.7–4.0)
MCHC: 33.2 g/dL (ref 30.0–36.0)
MCV: 94.9 fl (ref 78.0–100.0)
Monocytes Absolute: 0.5 10*3/uL (ref 0.1–1.0)
Monocytes Relative: 7.4 % (ref 3.0–12.0)
Neutro Abs: 4.1 10*3/uL (ref 1.4–7.7)
Neutrophils Relative %: 66 % (ref 43.0–77.0)
Platelets: 254 10*3/uL (ref 150.0–400.0)
RBC: 4.63 Mil/uL (ref 3.87–5.11)
RDW: 14.1 % (ref 11.5–15.5)
WBC: 6.2 10*3/uL (ref 4.0–10.5)

## 2020-04-23 LAB — HEPATIC FUNCTION PANEL
ALT: 10 U/L (ref 0–35)
AST: 15 U/L (ref 0–37)
Albumin: 4.3 g/dL (ref 3.5–5.2)
Alkaline Phosphatase: 72 U/L (ref 39–117)
Bilirubin, Direct: 0.1 mg/dL (ref 0.0–0.3)
Total Bilirubin: 0.6 mg/dL (ref 0.2–1.2)
Total Protein: 7 g/dL (ref 6.0–8.3)

## 2020-04-23 LAB — VITAMIN B12: Vitamin B-12: >1526 pg/mL — ABNORMAL HIGH (ref 211–911)

## 2020-04-23 LAB — VITAMIN D 25 HYDROXY (VIT D DEFICIENCY, FRACTURES): VITD: 44.24 ng/mL (ref 30.00–100.00)

## 2020-04-23 LAB — TSH: TSH: 1.18 u[IU]/mL (ref 0.35–4.50)

## 2020-04-23 MED ORDER — SERTRALINE HCL 25 MG PO TABS: 25.0000 mg | ORAL_TABLET | Freq: Every day | ORAL | 3 refills | Status: DC

## 2020-04-23 NOTE — Assessment & Plan Note (Signed)
Deteriorated.  I suspect this is due to her anxiety/depression right now and if we can get mood better, BP will follow.  Will monitor closely and start meds at upcoming visits if needed.

## 2020-04-23 NOTE — Patient Instructions (Signed)
[name] , Thank you for taking time to come for your Medicare Wellness Visit. I appreciate your ongoing commitment to your health goals. Please review the following plan we discussed and let me know if I can assist you in the future.   Screening recommendations/referrals: Colonoscopy: Completed 02/05/2012- Due 02/04/2022 Mammogram: Completed 06/30/2019-Due 06/27/2020 Bone Density: Due- Declined today. To be scheduled with mammogram in October. Recommended yearly ophthalmology/optometry visit for glaucoma screening and checkup Recommended yearly dental visit for hygiene and checkup  Vaccinations: Influenza vaccine: Due 05/2020 Pneumococcal vaccine: Completed vaccines Tdap vaccine: Up to date- Due-07/23/2028 Shingles vaccine: Discuss with pharmacy   Covid-19:Completed vaccines  Advanced directives: Please bring a copy of Living Will to your next appointment.  Conditions/risks identified: See problem list  Next appointment: Follow up in one year for your annual wellness visit    Preventive Care 65 Years and Older, Female Preventive care refers to lifestyle choices and visits with your health care provider that can promote health and wellness. What does preventive care include?  A yearly physical exam. This is also called an annual well check.  Dental exams once or twice a year.  Routine eye exams. Ask your health care provider how often you should have your eyes checked.  Personal lifestyle choices, including:  Daily care of your teeth and gums.  Regular physical activity.  Eating a healthy diet.  Avoiding tobacco and drug use.  Limiting alcohol use.  Practicing safe sex.  Taking low-dose aspirin every day.  Taking vitamin and mineral supplements as recommended by your health care provider. What happens during an annual well check? The services and screenings done by your health care provider during your annual well check will depend on your age, overall health,  lifestyle risk factors, and family history of disease. Counseling  Your health care provider may ask you questions about your:  Alcohol use.  Tobacco use.  Drug use.  Emotional well-being.  Home and relationship well-being.  Sexual activity.  Eating habits.  History of falls.  Memory and ability to understand (cognition).  Work and work Statistician.  Reproductive health. Screening  You may have the following tests or measurements:  Height, weight, and BMI.  Blood pressure.  Lipid and cholesterol levels. These may be checked every 5 years, or more frequently if you are over 27 years old.  Skin check.  Lung cancer screening. You may have this screening every year starting at age 38 if you have a 30-pack-year history of smoking and currently smoke or have quit within the past 15 years.  Fecal occult blood test (FOBT) of the stool. You may have this test every year starting at age 89.  Flexible sigmoidoscopy or colonoscopy. You may have a sigmoidoscopy every 5 years or a colonoscopy every 10 years starting at age 60.  Hepatitis C blood test.  Hepatitis B blood test.  Sexually transmitted disease (STD) testing.  Diabetes screening. This is done by checking your blood sugar (glucose) after you have not eaten for a while (fasting). You may have this done every 1-3 years.  Bone density scan. This is done to screen for osteoporosis. You may have this done starting at age 45.  Mammogram. This may be done every 1-2 years. Talk to your health care provider about how often you should have regular mammograms. Talk with your health care provider about your test results, treatment options, and if necessary, the need for more tests. Vaccines  Your health care provider may recommend certain vaccines,  such as:  Influenza vaccine. This is recommended every year.  Tetanus, diphtheria, and acellular pertussis (Tdap, Td) vaccine. You may need a Td booster every 10 years.  Zoster  vaccine. You may need this after age 29.  Pneumococcal 13-valent conjugate (PCV13) vaccine. One dose is recommended after age 77.  Pneumococcal polysaccharide (PPSV23) vaccine. One dose is recommended after age 20. Talk to your health care provider about which screenings and vaccines you need and how often you need them. This information is not intended to replace advice given to you by your health care provider. Make sure you discuss any questions you have with your health care provider. Document Released: 09/21/2015 Document Revised: 05/14/2016 Document Reviewed: 06/26/2015 Elsevier Interactive Patient Education  2017 [place] Prevention in the Home Falls can cause injuries. They can happen to people of all ages. There are many things you can do to make your home safe and to help prevent falls. What can I do on the outside of my home?  Regularly fix the edges of walkways and driveways and fix any cracks.  Remove anything that might make you trip as you walk through a door, such as a raised step or threshold.  Trim any bushes or trees on the path to your home.  Use bright outdoor lighting.  Clear any walking paths of anything that might make someone trip, such as rocks or tools.  Regularly check to see if handrails are loose or broken. Make sure that both sides of any steps have handrails.  Any raised decks and porches should have guardrails on the edges.  Have any leaves, snow, or ice cleared regularly.  Use sand or salt on walking paths during winter.  Clean up any spills in your garage right away. This includes oil or grease spills. What can I do in the bathroom?  Use night lights.  Install grab bars by the toilet and in the tub and shower. Do not use towel bars as grab bars.  Use non-skid mats or decals in the tub or shower.  If you need to sit down in the shower, use a plastic, non-slip stool.  Keep the floor dry. Clean up any water that spills on the  floor as soon as it happens.  Remove soap buildup in the tub or shower regularly.  Attach bath mats securely with double-sided non-slip rug tape.  Do not have throw rugs and other things on the floor that can make you trip. What can I do in the bedroom?  Use night lights.  Make sure that you have a light by your bed that is easy to reach.  Do not use any sheets or blankets that are too big for your bed. They should not hang down onto the floor.  Have a firm chair that has side arms. You can use this for support while you get dressed.  Do not have throw rugs and other things on the floor that can make you trip. What can I do in the kitchen?  Clean up any spills right away.  Avoid walking on wet floors.  Keep items that you use a lot in easy-to-reach places.  If you need to reach something above you, use a strong step stool that has a grab bar.  Keep electrical cords out of the way.  Do not use floor polish or wax that makes floors slippery. If you must use wax, use non-skid floor wax.  Do not have throw rugs and other things on  the floor that can make you trip. What can I do with my stairs?  Do not leave any items on the stairs.  Make sure that there are handrails on both sides of the stairs and use them. Fix handrails that are broken or loose. Make sure that handrails are as long as the stairways.  Check any carpeting to make sure that it is firmly attached to the stairs. Fix any carpet that is loose or worn.  Avoid having throw rugs at the top or bottom of the stairs. If you do have throw rugs, attach them to the floor with carpet tape.  Make sure that you have a light switch at the top of the stairs and the bottom of the stairs. If you do not have them, ask someone to add them for you. What else can I do to help prevent falls?  Wear shoes that:  Do not have high heels.  Have rubber bottoms.  Are comfortable and fit you well.  Are closed at the toe. Do not wear  sandals.  If you use a stepladder:  Make sure that it is fully opened. Do not climb a closed stepladder.  Make sure that both sides of the stepladder are locked into place.  Ask someone to hold it for you, if possible.  Clearly mark and make sure that you can see:  Any grab bars or handrails.  First and last steps.  Where the edge of each step is.  Use tools that help you move around (mobility aids) if they are needed. These include:  Canes.  Walkers.  Scooters.  Crutches.  Turn on the lights when you go into a dark area. Replace any light bulbs as soon as they burn out.  Set up your furniture so you have a clear path. Avoid moving your furniture around.  If any of your floors are uneven, fix them.  If there are any pets around you, be aware of where they are.  Review your medicines with your doctor. Some medicines can make you feel dizzy. This can increase your chance of falling. Ask your doctor what other things that you can do to help prevent falls. This information is not intended to replace advice given to you by your health care provider. Make sure you discuss any questions you have with your health care provider. Document Released: 06/21/2009 Document Revised: 01/31/2016 Document Reviewed: 09/29/2014 Elsevier Interactive Patient Education  2017 [employer].

## 2020-04-23 NOTE — Patient Instructions (Addendum)
Follow up in 1 month to recheck mood and BP We'll notify you of your lab results and make any changes if needed Keep up the good work on healthy diet and regular exercise- you're doing great! Start the Sertraline once daily Call with any questions or concerns Stay Safe!  Stay Healthy!

## 2020-04-23 NOTE — Assessment & Plan Note (Signed)
Check labs and replete prn. 

## 2020-04-23 NOTE — Progress Notes (Signed)
Subjective:   Sylvia Stevens is a 69 y.o. female who presents for Medicare Annual (Subsequent) preventive examination.  Review of Systems     Cardiac Risk Factors include: sedentary lifestyle;smoking/ tobacco exposure;dyslipidemia;hypertension;advanced age (>55men, >63 women)     Objective:    Today's Vitals   04/23/20 0802  BP: (!) 144/86  Pulse: 65  Resp: 16  Temp: 99.2 F (37.3 C)  TempSrc: Oral  SpO2: 97%  Weight: 136 lb (61.7 kg)  Height: 5\' 3"  (1.6 m)   Body mass index is 24.09 kg/m.  Advanced Directives 04/23/2020 07/06/2018  Does Patient Have a Medical Advance Directive? Yes Yes  Type of Advance Directive Living will Living will    Current Medications (verified) Outpatient Encounter Medications as of 04/23/2020  Medication Sig   aspirin 81 MG tablet Take 81 mg by mouth daily.     cholecalciferol (VITAMIN D) 1000 UNITS tablet Take 2,000 Units by mouth daily.    cyanocobalamin 1000 MCG tablet Take 1,000 mcg by mouth daily.   diclofenac (VOLTAREN) 25 MG EC tablet Take 25 mg by mouth 2 (two) times daily.   levothyroxine (SYNTHROID) 88 MCG tablet TAKE 1 TABLET BY MOUTH EVERY MORNING BEFORE BREAKFAST ON AN EMPTY STOMACH   [DISCONTINUED] Calcium Carbonate-Vit D-Min (CALCIUM 1200 PO) Take 1 capsule by mouth every evening.    [DISCONTINUED] Cyanocobalamin (VITAMIN B-12) 2500 MCG SUBL Place under the tongue.   [DISCONTINUED] Fish Oil-Cholecalciferol (FISH OIL + D3) 1200-1000 MG-UNIT CAPS Take by mouth.     No facility-administered encounter medications on file as of 04/23/2020.    Allergies (verified) Atorvastatin, Lobster [shellfish allergy], Penicillins, Strawberry extract, Erythromycin, Flexeril [cyclobenzaprine], and Omnipaque [iohexol]   History: Past Medical History:  Diagnosis Date   Arthritis    Endometrial polyp    Hypertension    Osteopenia 09/16/2012   T score -1.3 FRAX 7.6%/0.9%   Thyroid disease    hypothyroid   Past Surgical History:    Procedure Laterality Date   APPENDECTOMY     Bladder Bx     CARPAL TUNNEL RELEASE     X 2   CESAREAN SECTION     X 2   HERNIA REPAIR     TOE SURGERY     TUBAL LIGATION     Family History  Problem Relation Age of Onset   Alzheimer's disease Mother    Heart disease Father    Pancreatic cancer Father    Arthritis Father    Hyperlipidemia Father    Hypertension Father    Cancer Paternal Aunt        Ovarian or Uterine cancer   Aneurysm Paternal Uncle    Social History   Socioeconomic History   Marital status: Married    Spouse name: Not on file   Number of children: Not on file   Years of education: Not on file   Highest education level: Not on file  Occupational History   Not on file  Tobacco Use   Smoking status: Current Every Day Smoker    Packs/day: 0.50    Types: Cigarettes   Smokeless tobacco: Never Used  Vaping Use   Vaping Use: Never used  Substance and Sexual Activity   Alcohol use: Yes    Alcohol/week: 0.0 standard drinks    Comment: rare   Drug use: No   Sexual activity: Never    Birth control/protection: Surgical  Other Topics Concern   Not on file  Social History Narrative   Lives with husband  in a one story home.  Has 2 children.  Works part time as an Biochemist, clinical.  Education: some college.   Social Determinants of Health   Financial Resource Strain: Low Risk    Difficulty of Paying Living Expenses: Not hard at all  Food Insecurity: No Food Insecurity   Worried About Charity fundraiser in the Last Year: Never true   [place] in the Last Year: Never true  Transportation Needs: No Transportation Needs   Lack of Transportation (Medical): No   Lack of Transportation (Non-Medical): No  Physical Activity: Inactive   Days of Exercise per Week: 0 days   Minutes of Exercise per Session: 0 min  Stress: No Stress Concern Present   Feeling of Stress : Not at all  Social Connections: Moderately  Integrated   Frequency of Communication with Friends and Family: More than three times a week   Frequency of Social Gatherings with Friends and Family: More than three times a week   Attends Religious Services: More than 4 times per year   Active Member of [employer] or Organizations: Yes   Attends Archivist Meetings: More than 4 times per year   Marital Status: Widowed    Tobacco Counseling Ready to quit: Not Answered Counseling given: Not Answered   Clinical Intake:  Pre-visit preparation completed: Yes  Pain : No/denies pain     Nutritional Status: BMI of 19-24  Normal Nutritional Risks: None Diabetes: No  How often do you need to have someone help you when you read instructions, pamphlets, or other written materials from your doctor or pharmacy?: 1 - Never What is the last grade level you completed in school?: 1 yr of college  Diabetic?No  Interpreter Needed?: No  Information entered by :: Caroleen Hamman LPN   Activities of Daily Living In your present state of health, do you have any difficulty performing the following activities: 04/23/2020  Hearing? N  Vision? N  Difficulty concentrating or making decisions? N  Walking or climbing stairs? N  Dressing or bathing? N  Doing errands, shopping? N  Preparing Food and eating ? N  Using the Toilet? N  In the past six months, have you accidently leaked urine? N  Do you have problems with loss of bowel control? N  Managing your Medications? N  Managing your Finances? N  Housekeeping or managing your Housekeeping? N  Some recent data might be hidden    Patient Care Team: Midge Minium, MD as PCP - General (Family Medicine) Suella Broad, MD as Consulting Physician (Physical Medicine and Rehabilitation) Philemon Kingdom, MD as Consulting Physician (Internal Medicine) Richmond Campbell, MD as Consulting Physician (Gastroenterology)  Indicate any recent Medical Services you may have received from  other than [facility] providers in the past year (date may be approximate).     Assessment:   This is a routine wellness examination for Sylvia Stevens.  Hearing/Vision screen  Hearing Screening   125Hz  250Hz  500Hz  1000Hz  2000Hz  3000Hz  4000Hz  6000Hz  8000Hz   Right ear:           Left ear:           Comments: No issues  Vision Screening Comments: Wears glasses Last eye exam-2019-Dr Groat  Dietary issues and exercise activities discussed: Current Exercise Habits: The patient does not participate in regular exercise at present, Exercise limited by: None identified  Goals     Patient Stated     Lose weight by eating healthier  Depression Screen PHQ 2/9 Scores 04/23/2020 04/22/2019 09/30/2018 07/06/2018 06/04/2018 04/12/2018 04/28/2017  PHQ - 2 Score 4 0 0 0 0 0 0  PHQ- 9 Score 14 0 - - 0 0 0    Fall Risk Fall Risk  04/23/2020 04/22/2019 09/30/2018 07/06/2018 06/04/2018  Falls in the past year? 1 0 0 No No  Number falls in past yr: 1 0 - - -  Injury with Fall? 0 0 - - -  Risk for fall due to : History of fall(s) - - - -  Follow up Falls prevention discussed - - - -    Any stairs in or around the home? No  Home free of loose throw rugs in walkways, pet beds, electrical cords, etc? Yes  Adequate lighting in your home to reduce risk of falls? Yes   ASSISTIVE DEVICES UTILIZED TO PREVENT FALLS:  Life alert? No  Use of a cane, walker or w/c? No  Grab bars in the bathroom? No  Shower chair or bench in shower? No  Elevated toilet seat or a handicapped toilet? No   TIMED UP AND GO:  Was the test performed? Yes .  Length of time to ambulate 10 feet: 10 sec.   Gait slow and steady without use of assistive device  Cognitive Function: No cognitive impairment noted.  MMSE - Mini Mental State Exam 07/06/2018  Orientation to time 5  Orientation to Place 5  Registration 3  Attention/ Calculation 5  Recall 3  Language- name 2 objects 2  Language- repeat 1  Language- follow 3 step command 3    Language- read & follow direction 1  Write a sentence 1  Copy design 1  Total score 30        Immunizations Immunization History  Administered Date(s) Administered   Influenza Whole 06/10/2012, 06/14/2013   Influenza,inj,Quad PF,6+ Mos 06/05/2014, 05/11/2015   Influenza-Unspecified 05/23/2016, 05/15/2017, 05/19/2018   PFIZER SARS-COV-2 Vaccination 10/14/2019, 11/09/2019   Pneumococcal Conjugate-13 04/28/2017   Pneumococcal Polysaccharide-23 07/06/2018   Tdap 09/08/2010, 07/23/2018   Zoster 06/22/2013    TDAP status: Up to date   Flu Vaccine status: Up to date Due 05/2020  Pneumococcal vaccine status: Up to date   Covid-19 vaccine status: Completed vaccines  Qualifies for Shingles Vaccine? Yes   Zostavax completed Yes   Shingrix Completed?: No.    Education has been provided regarding the importance of this vaccine. Patient has been advised to call insurance company to determine out of pocket expense if they have not yet received this vaccine. Advised may also receive vaccine at local pharmacy or Health Dept. Verbalized acceptance and understanding.  Screening Tests Health Maintenance  Topic Date Due   Hepatitis C Screening  Never done   DEXA SCAN  06/20/2019   INFLUENZA VACCINE  04/08/2020   MAMMOGRAM  06/29/2020   COLONOSCOPY  02/04/2022   TETANUS/TDAP  07/23/2028   COVID-19 Vaccine  Completed   PNA vac Low Risk Adult  Completed    Health Maintenance  Health Maintenance Due  Topic Date Due   Hepatitis C Screening  Never done   DEXA SCAN  06/20/2019   INFLUENZA VACCINE  04/08/2020    Colorectal cancer screening: Completed 01/26/2012. Repeat every 10 years   Mammogram status: Completed 06/30/2019. Repeat every year   Bone Density Screening: Due- Patient wants to wait until she has her mammogram.  Lung Cancer Screening: (Low Dose CT Chest recommended if Age 71-80 years, 30 pack-year currently smoking OR have quit w/in  15years.) does  qualify.   Lung Cancer Screening Referral: Patient declined at this time  Additional Screening:  Hepatitis C Screening: does qualify; Discuss with PCP  Vision Screening: Recommended annual ophthalmology exams for early detection of glaucoma and other disorders of the eye. Is the patient up to date with their annual eye exam?  No  Who is the provider or what is the name of the office in which the patient attends annual eye exams? Dr. Katy Fitch  Patient has an upcoming appt.  Dental Screening: Recommended annual dental exams for proper oral hygiene  Community Resource Referral / Chronic Care Management: CRR required this visit?  No   CCM required this visit?  No      Plan:     I have personally reviewed and noted the following in the patients chart:    Medical and social history  Use of alcohol, tobacco or illicit drugs   Current medications and supplements  Functional ability and status  Nutritional status  Physical activity  Advanced directives  List of other physicians  Hospitalizations, surgeries, and ER visits in previous 12 months  Vitals  Screenings to include cognitive, depression, and falls  Referrals and appointments  In addition, I have reviewed and discussed with patient certain preventive protocols, quality metrics, and best practice recommendations. A written personalized care plan for preventive services as well as general preventive health recommendations were provided to patient.     Marta Antu, LPN   02/22/8371  Nurse Health Advisor  Nurse Notes: Depression score 14- Patient has an appt with PCP today.

## 2020-04-23 NOTE — Assessment & Plan Note (Signed)
Pt has hx of this.  Check labs and replete prn. 

## 2020-04-23 NOTE — Progress Notes (Signed)
   Subjective:    Patient ID: Sylvia Stevens, female    DOB: 1951/02/03, 69 y.o.   MRN: [id number]  HPI CPE- UTD on mammo, colonoscopy, immunizations.  Reviewed past medical, surgical, family and social histories.  Health Maintenance  Topic Date Due  . Hepatitis C Screening  Never done  . DEXA SCAN  06/20/2019  . INFLUENZA VACCINE  04/08/2020  . MAMMOGRAM  06/29/2020  . COLONOSCOPY  02/04/2022  . TETANUS/TDAP  07/23/2028  . COVID-19 Vaccine  Completed  . PNA vac Low Risk Adult  Completed     Patient Care Team    Relationship Specialty Notifications Start End  Midge Minium, MD PCP - General Family Medicine  11/28/11   Suella Broad, MD Consulting Physician Physical Medicine and Rehabilitation  04/07/14   Philemon Kingdom, MD Consulting Physician Internal Medicine  03/29/15   Richmond Campbell, MD Consulting Physician Gastroenterology  03/29/15       Review of Systems Patient reports no vision/ hearing changes, adenopathy,fever, weight change,  persistant/recurrent hoarseness , swallowing issues, chest pain, palpitations, edema, persistant/recurrent cough, hemoptysis, dyspnea (rest/exertional/paroxysmal nocturnal), gastrointestinal bleeding (melena, rectal bleeding), abdominal pain, significant heartburn, bowel changes, GU symptoms (dysuria, hematuria, incontinence), Gyn symptoms (abnormal  bleeding, pain),  syncope, focal weakness, memory loss, numbness & tingling, skin/hair/nail changes, abnormal bruising or bleeding.   + depression- PHQ=14.  Job stress, daughter totaled her car, 7 days later son is in an accident.  Husband's recent CT showed 'spot on R kidney'.  'it's coming from all areas'.  Low motivation, low energy.  This visit occurred during the SARS-CoV-2 public health emergency.  Safety protocols were in place, including screening questions prior to the visit, additional usage of staff PPE, and extensive cleaning of exam room while observing appropriate contact time as  indicated for disinfecting solutions.       Objective:   Physical Exam General Appearance:    Alert, cooperative, no distress, appears stated age  Head:    Normocephalic, without obvious abnormality, atraumatic  Eyes:    PERRL, conjunctiva/corneas clear, EOM's intact, fundi    benign, both eyes  Ears:    Normal TM's and external ear canals, both ears  Nose:   Deferred due to COVID  Throat:   Neck:   Supple, symmetrical, trachea midline, no adenopathy;    Thyroid: no enlargement/tenderness/nodules  Back:     Symmetric, no curvature, ROM normal, no CVA tenderness  Lungs:     Clear to auscultation bilaterally, respirations unlabored  Chest Wall:    No tenderness or deformity   Heart:    Regular rate and rhythm, S1 and S2 normal, no murmur, rub   or gallop  Breast Exam:    Deferred to mammo  Abdomen:     Soft, non-tender, bowel sounds active all four quadrants,    no masses, no organomegaly  Genitalia:    Deferred  Rectal:    Extremities:   Extremities normal, atraumatic, no cyanosis or edema  Pulses:   2+ and symmetric all extremities  Skin:   Skin color, texture, turgor normal, no rashes or lesions  Lymph nodes:   Cervical, supraclavicular, and axillary nodes normal  Neurologic:   CNII-XII intact, normal strength, sensation and reflexes    throughout          Assessment & Plan:

## 2020-04-23 NOTE — Assessment & Plan Note (Signed)
Pt's PE WNL w/ exception of depression and elevated BP.  UTD on mammo, colonoscopy, immunizations.  Check labs.  Anticipatory guidance provided.

## 2020-04-23 NOTE — Assessment & Plan Note (Signed)
New.  Pt is very stressed with all that's going on- husband's illness, work stress, COVID, children's recent MVAs.  She admits that she is not herself these days.  Will start low dose Sertraline and monitor for mood improvement- adjusting meds prn.

## 2020-04-24 ENCOUNTER — Encounter: Payer: Self-pay | Admitting: General Practice

## 2020-04-26 DIAGNOSIS — H524 Presbyopia: Secondary | ICD-10-CM | POA: Diagnosis not present

## 2020-04-26 DIAGNOSIS — H04123 Dry eye syndrome of bilateral lacrimal glands: Secondary | ICD-10-CM | POA: Diagnosis not present

## 2020-04-26 DIAGNOSIS — H43812 Vitreous degeneration, left eye: Secondary | ICD-10-CM | POA: Diagnosis not present

## 2020-04-26 DIAGNOSIS — H5213 Myopia, bilateral: Secondary | ICD-10-CM | POA: Diagnosis not present

## 2020-04-26 DIAGNOSIS — H2513 Age-related nuclear cataract, bilateral: Secondary | ICD-10-CM | POA: Diagnosis not present

## 2020-04-26 DIAGNOSIS — H1045 Other chronic allergic conjunctivitis: Secondary | ICD-10-CM | POA: Diagnosis not present

## 2020-05-25 ENCOUNTER — Ambulatory Visit: Payer: PPO | Admitting: Family Medicine

## 2020-05-28 ENCOUNTER — Encounter: Payer: Self-pay | Admitting: Family Medicine

## 2020-05-28 ENCOUNTER — Other Ambulatory Visit: Payer: Self-pay

## 2020-05-28 ENCOUNTER — Ambulatory Visit (INDEPENDENT_AMBULATORY_CARE_PROVIDER_SITE_OTHER): Payer: PPO | Admitting: Family Medicine

## 2020-05-28 VITALS — BP 130/80 | HR 76 | Temp 97.8°F | Resp 16 | Ht 63.0 in | Wt 138.5 lb

## 2020-05-28 DIAGNOSIS — F329 Major depressive disorder, single episode, unspecified: Secondary | ICD-10-CM

## 2020-05-28 DIAGNOSIS — I10 Essential (primary) hypertension: Secondary | ICD-10-CM

## 2020-05-28 DIAGNOSIS — Z23 Encounter for immunization: Secondary | ICD-10-CM

## 2020-05-28 DIAGNOSIS — F32A Depression, unspecified: Secondary | ICD-10-CM

## 2020-05-28 NOTE — Progress Notes (Signed)
   Subjective:    Patient ID: Sylvia Stevens, female    DOB: 1951/06/06, 69 y.o.   MRN: [id number]  HPI Depression- pt was started on low dose Sertraline at last visit due to stress over her husband's ongoing illness.  Pt feels mood is 'going better'.    HTN- BP is better controlled today w/ mood improvement.  Pt reports BP was high last Monday due to work stress- 170s.  It has been running 140s/90 at home.  No CP, SOB, HAs, visual changes, edema.  BP is better today b/c she hasn't been at work since Wednesday   Review of Systems For ROS see HPI   This visit occurred during the SARS-CoV-2 public health emergency.  Safety protocols were in place, including screening questions prior to the visit, additional usage of staff PPE, and extensive cleaning of exam room while observing appropriate contact time as indicated for disinfecting solutions.       Objective:   Physical Exam Vitals reviewed.  Constitutional:      General: She is not in acute distress.    Appearance: Normal appearance. She is not ill-appearing.  HENT:     Head: Normocephalic and atraumatic.  Eyes:     Extraocular Movements: Extraocular movements intact.     Conjunctiva/sclera: Conjunctivae normal.     Pupils: Pupils are equal, round, and reactive to light.  Musculoskeletal:     Right lower leg: No edema.     Left lower leg: No edema.  Skin:    General: Skin is warm and dry.  Neurological:     General: No focal deficit present.     Mental Status: She is alert and oriented to person, place, and time.  Psychiatric:        Mood and Affect: Mood normal.        Behavior: Behavior normal.        Thought Content: Thought content normal.           Assessment & Plan:

## 2020-05-28 NOTE — Patient Instructions (Addendum)
Follow up in 3 months to recheck BP and mood No need for labs today- you look great! 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  Let me know if we need to increase the dose at anytime Call with any questions or concerns Hang in there!

## 2020-05-28 NOTE — Assessment & Plan Note (Signed)
Improving w/ addition of Sertraline 25mg  daily.  She is not interested in increasing dose at this time but is aware that we can do that if it is ever needed.  She will message me if she feels the need to increase her medication

## 2020-05-28 NOTE — Assessment & Plan Note (Signed)
BP is much better today.  She suspects this is partly b/c she hasn't worked since Wednesday.  Currently asymptomatic.  Did discuss the option of Metoprolol PRN if she again has elevated HR and BP due to stress.  She is not interested at this time but she will continue to monitor home BPs.

## 2020-06-06 ENCOUNTER — Ambulatory Visit: Payer: PPO | Admitting: Family Medicine

## 2020-06-25 ENCOUNTER — Encounter: Payer: Self-pay | Admitting: Family Medicine

## 2020-06-26 MED ORDER — SERTRALINE HCL 50 MG PO TABS: 50.0000 mg | ORAL_TABLET | Freq: Every day | ORAL | 3 refills | Status: DC

## 2020-07-06 ENCOUNTER — Ambulatory Visit: Payer: PPO | Attending: Internal Medicine

## 2020-07-06 ENCOUNTER — Other Ambulatory Visit (HOSPITAL_BASED_OUTPATIENT_CLINIC_OR_DEPARTMENT_OTHER): Payer: Self-pay | Admitting: Internal Medicine

## 2020-07-06 DIAGNOSIS — Z23 Encounter for immunization: Secondary | ICD-10-CM

## 2020-07-06 NOTE — Progress Notes (Signed)
   Covid-19 Vaccination Clinic  Name:  Sylvia Stevens    MRN: [id number] DOB: [birth year]  07/06/2020  [name] was observed post Covid-19 immunization for 15 minutes without incident. She was provided with Vaccine Information Sheet and instruction to access the V-Safe system.   [name] was instructed to call 911 with any severe reactions post vaccine: Marland Kitchen Difficulty breathing  . Swelling of face and throat  . A fast heartbeat  . A bad rash all over body  . Dizziness and weakness

## 2020-07-10 MED FILL — PFIZER-BIONTECH COVID-19 VA: 30 | 1 days supply | Qty: 0 | Fill #0

## 2020-08-24 ENCOUNTER — Ambulatory Visit (INDEPENDENT_AMBULATORY_CARE_PROVIDER_SITE_OTHER): Payer: PPO | Admitting: Family Medicine

## 2020-08-24 ENCOUNTER — Other Ambulatory Visit: Payer: Self-pay

## 2020-08-24 ENCOUNTER — Encounter: Payer: Self-pay | Admitting: Family Medicine

## 2020-08-24 VITALS — BP 130/80 | HR 69 | Temp 98.5°F | Resp 15 | Ht 63.0 in | Wt 137.4 lb

## 2020-08-24 DIAGNOSIS — F32A Depression, unspecified: Secondary | ICD-10-CM

## 2020-08-24 DIAGNOSIS — I10 Essential (primary) hypertension: Secondary | ICD-10-CM

## 2020-08-24 DIAGNOSIS — H02846 Edema of left eye, unspecified eyelid: Secondary | ICD-10-CM | POA: Diagnosis not present

## 2020-08-24 DIAGNOSIS — M8580 Other specified disorders of bone density and structure, unspecified site: Secondary | ICD-10-CM | POA: Diagnosis not present

## 2020-08-24 DIAGNOSIS — Z1231 Encounter for screening mammogram for malignant neoplasm of breast: Secondary | ICD-10-CM | POA: Diagnosis not present

## 2020-08-24 NOTE — Assessment & Plan Note (Signed)
BP well controlled today w/o medication.  No need to start meds at this time.  Will follow.

## 2020-08-24 NOTE — Progress Notes (Signed)
   Subjective:    Patient ID: Sylvia Stevens, female    DOB: September 30, 1950, 69 y.o.   MRN: [id number]  HPI HTN- chronic problem, currently controlled w/o medication.  No CP, SOB, HAs, vision changes, edema.  Depression- pt's Sertraline was increased to 50mg  daily, 'it helped a lot'.  Less irritable, less tearful.  Swollen eyelid- L eye, started last week.  Took allergy medication and sxs improved but was again swollen this AM.  Described as 'irritated when touched' but not painful.   Review of Systems For ROS see HPI   This visit occurred during the SARS-CoV-2 public health emergency.  Safety protocols were in place, including screening questions prior to the visit, additional usage of staff PPE, and extensive cleaning of exam room while observing appropriate contact time as indicated for disinfecting solutions.       Objective:   Physical Exam Vitals reviewed.  Constitutional:      General: She is not in acute distress.    Appearance: Normal appearance. She is well-developed and well-nourished.  HENT:     Head: Normocephalic and atraumatic.  Eyes:     Extraocular Movements: EOM normal.     Conjunctiva/sclera: Conjunctivae normal.     Pupils: Pupils are equal, round, and reactive to light.     Comments: Small R upper lid stye  Neck:     Thyroid: No thyromegaly.  Cardiovascular:     Rate and Rhythm: Normal rate and regular rhythm.     Pulses: Intact distal pulses.     Heart sounds: Normal heart sounds. No murmur heard.   Pulmonary:     Effort: Pulmonary effort is normal. No respiratory distress.     Breath sounds: Normal breath sounds.  Abdominal:     General: There is no distension.     Palpations: Abdomen is soft.     Tenderness: There is no abdominal tenderness.  Musculoskeletal:        General: No edema.     Cervical back: Normal range of motion and neck supple.  Lymphadenopathy:     Cervical: No cervical adenopathy.  Skin:    General: Skin is warm and dry.   Neurological:     General: No focal deficit present.     Mental Status: She is alert and oriented to person, place, and time.  Psychiatric:        Mood and Affect: Mood and affect and mood normal.        Behavior: Behavior normal.        Thought Content: Thought content normal.           Assessment & Plan:  Swollen R eyelid- pt has small stye on R upper lid.  Encouraged hot compresses.

## 2020-08-24 NOTE — Patient Instructions (Addendum)
Schedule your complete physical in August No med changes at this time- you're doing great! Apply hot compresses to the eye to resolve sty Call with any questions or concerns Stay Safe!  Stay Healthy! Happy Holidays!

## 2020-08-24 NOTE — Assessment & Plan Note (Signed)
DEXA ordered.  

## 2020-08-24 NOTE — Assessment & Plan Note (Signed)
Much improved w/ increased dose of Sertraline.  No changes at this time

## 2020-10-22 ENCOUNTER — Other Ambulatory Visit: Payer: Self-pay

## 2020-10-22 DIAGNOSIS — F32A Depression, unspecified: Secondary | ICD-10-CM

## 2020-10-22 MED ORDER — SERTRALINE HCL 50 MG PO TABS: 50.0000 mg | ORAL_TABLET | Freq: Every day | ORAL | 0 refills | Status: DC

## 2020-10-25 DIAGNOSIS — M85851 Other specified disorders of bone density and structure, right thigh: Secondary | ICD-10-CM | POA: Diagnosis not present

## 2020-10-25 DIAGNOSIS — Z1231 Encounter for screening mammogram for malignant neoplasm of breast: Secondary | ICD-10-CM | POA: Diagnosis not present

## 2020-10-25 DIAGNOSIS — Z780 Asymptomatic menopausal state: Secondary | ICD-10-CM | POA: Diagnosis not present

## 2020-10-25 DIAGNOSIS — M85852 Other specified disorders of bone density and structure, left thigh: Secondary | ICD-10-CM | POA: Diagnosis not present

## 2020-10-25 LAB — HM DEXA SCAN

## 2020-10-25 LAB — HM MAMMOGRAPHY

## 2020-10-27 ENCOUNTER — Other Ambulatory Visit: Payer: Self-pay | Admitting: Internal Medicine

## 2020-10-30 ENCOUNTER — Telehealth: Payer: Self-pay

## 2020-10-30 NOTE — Telephone Encounter (Signed)
Called to inform patient that her dexa scan resulted to osteopenia. Patient is to take Cal/Vitamin D daily per provider. Patient understood. No concerns at this time.

## 2020-11-14 ENCOUNTER — Telehealth: Payer: Self-pay

## 2020-11-14 NOTE — Telephone Encounter (Signed)
Called and left a detailed message on patient vm regarding bone density scan results. Pt will return call to office if there are any questions or concerns.

## 2020-11-30 ENCOUNTER — Other Ambulatory Visit: Payer: Self-pay

## 2020-11-30 ENCOUNTER — Telehealth: Payer: Self-pay | Admitting: Family Medicine

## 2020-11-30 DIAGNOSIS — F32A Depression, unspecified: Secondary | ICD-10-CM

## 2020-11-30 MED ORDER — SERTRALINE HCL 50 MG PO TABS: 50.0000 mg | ORAL_TABLET | Freq: Every day | ORAL | 0 refills | Status: DC

## 2020-11-30 NOTE — Telephone Encounter (Signed)
Rx sent to patient pharmacy.

## 2020-11-30 NOTE — Telephone Encounter (Signed)
Pt called in asking for a refill on the Sertraline, pt uses [facility] on [address]..   Pt only has 5 more days of medication and states that she requested on Monday.   Please advise

## 2020-12-25 ENCOUNTER — Encounter: Payer: Self-pay | Admitting: Family Medicine

## 2020-12-25 ENCOUNTER — Other Ambulatory Visit: Payer: Self-pay

## 2020-12-25 DIAGNOSIS — F32A Depression, unspecified: Secondary | ICD-10-CM

## 2020-12-25 MED ORDER — SERTRALINE HCL 50 MG PO TABS: 50.0000 mg | ORAL_TABLET | Freq: Every day | ORAL | 0 refills | Status: DC

## 2021-01-07 ENCOUNTER — Other Ambulatory Visit (HOSPITAL_BASED_OUTPATIENT_CLINIC_OR_DEPARTMENT_OTHER): Payer: Self-pay

## 2021-01-07 ENCOUNTER — Ambulatory Visit: Payer: PPO | Attending: Internal Medicine

## 2021-01-07 DIAGNOSIS — Z23 Encounter for immunization: Secondary | ICD-10-CM

## 2021-01-07 MED ORDER — PFIZER-BIONT COVID-19 VAC-TRIS 30 MCG/0.3ML IM SUSP
INTRAMUSCULAR | 0 refills | Status: DC
  Filled 2021-01-07: qty 0.3, 1d supply, fill #0

## 2021-01-07 NOTE — Progress Notes (Signed)
   Covid-19 Vaccination Clinic  Name:  Sylvia Stevens    MRN: [id number] DOB: [birth year]  01/07/2021  [name] was observed post Covid-19 immunization for 15 minutes without incident. She was provided with Vaccine Information Sheet and instruction to access the V-Safe system.   [name] was instructed to call 911 with any severe reactions post vaccine: Marland Kitchen Difficulty breathing  . Swelling of face and throat  . A fast heartbeat  . A bad rash all over body  . Dizziness and weakness   Immunizations Administered    Name Date Dose VIS Date Route   PFIZER Comrnaty([employer]) Covid-19 Vaccine 01/07/2021 11:53 AM 0.3 mL 08/16/2020 Intramuscular   Manufacturer: [employer]   Lot: [id number]   NDC: [phone]

## 2021-01-18 ENCOUNTER — Encounter: Payer: Self-pay | Admitting: Internal Medicine

## 2021-01-18 ENCOUNTER — Other Ambulatory Visit: Payer: Self-pay

## 2021-01-18 ENCOUNTER — Ambulatory Visit: Payer: PPO | Admitting: Internal Medicine

## 2021-01-18 VITALS — BP 150/90 | HR 62 | Ht 63.0 in | Wt 143.8 lb

## 2021-01-18 DIAGNOSIS — E559 Vitamin D deficiency, unspecified: Secondary | ICD-10-CM | POA: Diagnosis not present

## 2021-01-18 DIAGNOSIS — E039 Hypothyroidism, unspecified: Secondary | ICD-10-CM | POA: Diagnosis not present

## 2021-01-18 DIAGNOSIS — E538 Deficiency of other specified B group vitamins: Secondary | ICD-10-CM

## 2021-01-18 LAB — T4, FREE: Free T4: 1.03 ng/dL (ref 0.60–1.60)

## 2021-01-18 LAB — VITAMIN D 25 HYDROXY (VIT D DEFICIENCY, FRACTURES): VITD: 41.25 ng/mL (ref 30.00–100.00)

## 2021-01-18 LAB — VITAMIN B12: Vitamin B-12: 1002 pg/mL — ABNORMAL HIGH (ref 211–911)

## 2021-01-18 LAB — TSH: TSH: 3.73 u[IU]/mL (ref 0.35–4.50)

## 2021-01-18 MED ORDER — LEVOTHYROXINE SODIUM 88 MCG PO TABS: 88.0000 ug | ORAL_TABLET | Freq: Every day | ORAL | 3 refills | Status: DC

## 2021-01-18 NOTE — Patient Instructions (Signed)
Please stop at the lab.  Continue Levothyroxine 88 mcg daily.  Take the thyroid hormone every day, with water, at least 30 minutes before breakfast, separated by at least 4 hours from: - acid reflux medications - calcium - iron - multivitamins  Please continue: - Vitamin D 2000 units daily - Vitamin B12 1000 mcg daily  Please return in 1 year.
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
## 2021-01-21 ENCOUNTER — Other Ambulatory Visit: Payer: Self-pay | Admitting: Family Medicine

## 2021-01-21 DIAGNOSIS — F32A Depression, unspecified: Secondary | ICD-10-CM

## 2021-02-20 ENCOUNTER — Other Ambulatory Visit: Payer: Self-pay | Admitting: Family Medicine

## 2021-02-20 DIAGNOSIS — F32A Depression, unspecified: Secondary | ICD-10-CM

## 2021-03-06 ENCOUNTER — Encounter: Payer: Self-pay | Admitting: *Deleted

## 2021-03-19 ENCOUNTER — Telehealth: Payer: Self-pay | Admitting: *Deleted

## 2021-03-19 NOTE — Chronic Care Management (AMB) (Signed)
  Chronic Care Management   Note  03/19/2021 Name: Sylvia Stevens MRN: [id number] DOB: 01/22/1951  Sylvia Stevens is a 70 y.o. year old female who is a primary care patient of Birdie Riddle, Aundra Millet, MD. I reached out to [employer] by phone today in response to a referral sent by Sylvia Stevens's PCP Midge Minium, MD     Sylvia Stevens was given information about Chronic Care Management services today including:  CCM service includes personalized support from designated clinical staff supervised by her physician, including individualized plan of care and coordination with other care providers 24/7 contact phone numbers for assistance for urgent and routine care needs. Service will only be billed when office clinical staff spend 20 minutes or more in a month to coordinate care. Only one practitioner may furnish and bill the service in a calendar month. The patient may stop CCM services at any time (effective at the end of the month) by phone call to the office staff. The patient will be responsible for cost sharing (co-pay) of up to 20% of the service fee (after annual deductible is met).  Patient agreed to services and verbal consent obtained.   Follow up plan: Telephone appointment with care management team member scheduled for:04/10/2021  Julian Hy, [place] Management  Direct Dial: [phone]

## 2021-03-22 ENCOUNTER — Other Ambulatory Visit: Payer: Self-pay | Admitting: Family Medicine

## 2021-03-22 DIAGNOSIS — F32A Depression, unspecified: Secondary | ICD-10-CM

## 2021-03-28 ENCOUNTER — Ambulatory Visit: Payer: PPO | Attending: Internal Medicine

## 2021-03-28 DIAGNOSIS — Z20822 Contact with and (suspected) exposure to covid-19: Secondary | ICD-10-CM

## 2021-03-29 LAB — NOVEL CORONAVIRUS, NAA: SARS-CoV-2, NAA: DETECTED — AB

## 2021-03-29 LAB — SARS-COV-2, NAA 2 DAY TAT

## 2021-04-10 ENCOUNTER — Ambulatory Visit (INDEPENDENT_AMBULATORY_CARE_PROVIDER_SITE_OTHER): Payer: PPO

## 2021-04-10 DIAGNOSIS — I10 Essential (primary) hypertension: Secondary | ICD-10-CM | POA: Diagnosis not present

## 2021-04-10 DIAGNOSIS — E785 Hyperlipidemia, unspecified: Secondary | ICD-10-CM | POA: Diagnosis not present

## 2021-04-10 NOTE — Patient Instructions (Signed)
Visit Information   PATIENT GOALS:   Goals Addressed             This Visit's Progress    RNCM:Manage My Cholesterol   On track    Timeframe:  Long-Range Goal Priority:  High Start Date:     04/10/21                        Expected End Date:     09/08/21                 Follow Up Date 06/26/22    - change to whole grain breads, cereal, pasta - eat smaller or less servings of red meat - fill half the plate with nonstarchy vegetables - get blood test (fasting) done 1 week before next visit - increase the amount of fiber in food - read food labels for fat and fiber - switch to low-fat or skim milk    Why is this important?   Changing cholesterol starts with eating heart-healthy foods.  Other steps may be to increase your activity and to quit if you smoke.    Notes:      RNCM:Track and Manage My Blood Pressure-Hypertension   On track    Timeframe:  Long-Range Goal Priority:  Medium Start Date:       04/10/21                      Expected End Date:     09/08/21                  Follow Up Date 06/26/21    - check blood pressure 1-2 times a month - choose a place to take my blood pressure (home, clinic or office, retail store) - write blood pressure results in a log or diary    Why is this important?   You won't feel high blood pressure, but it can still hurt your blood vessels.  High blood pressure can cause heart or kidney problems. It can also cause a stroke.  Making lifestyle changes like losing a little weight or eating less salt will help.  Checking your blood pressure at home and at different times of the day can help to control blood pressure.  If the doctor prescribes medicine remember to take it the way the doctor ordered.  Call the office if you cannot afford the medicine or if there are questions about it.     Notes:       [email or web address]">  DASH Eating Plan DASH stands for Dietary Approaches to Stop Hypertension.  The DASH eating plan is a healthy eating plan that has been shown to: Reduce high blood pressure (hypertension). Reduce your risk for type 2 diabetes, heart disease, and stroke. Help with weight loss. What are tips for following this plan? Reading food labels Check food labels for the amount of salt (sodium) per serving. Choose foods with less than 5 percent of the Daily Value of sodium. Generally, foods with less than 300 milligrams (mg) of sodium per serving fit into this eating plan. To find whole grains, look for the word "whole" as the first word in the ingredient list. Shopping Buy products labeled as "low-sodium" or "no salt added." Buy fresh foods. Avoid canned foods and pre-made or frozen meals. Cooking Avoid adding salt when cooking. Use salt-free seasonings or herbs instead of table salt or sea salt. Check with your health care provider or pharmacist  before using salt substitutes. Do not fry foods. Cook foods using healthy methods such as baking, boiling, grilling, roasting, and broiling instead. Cook with heart-healthy oils, such as olive, canola, avocado, soybean, or sunflower oil. Meal planning  Eat a balanced diet that includes: 4 or more servings of fruits and 4 or more servings of vegetables each day. Try to fill one-half of your plate with fruits and vegetables. 6-8 servings of whole grains each day. Less than 6 oz (170 g) of lean meat, poultry, or fish each day. A 3-oz (85-g) serving of meat is about the same size as a deck of cards. One egg equals 1 oz (28 g). 2-3 servings of low-fat dairy each day. One serving is 1 cup (237 mL). 1 serving of nuts, seeds, or beans 5 times each week. 2-3 servings of heart-healthy fats. Healthy fats called omega-3 fatty acids are found in foods such as walnuts, flaxseeds, fortified milks, and eggs. These fats are also found in cold-water fish, such as sardines, salmon, and mackerel. Limit how much you eat of: Canned or prepackaged  foods. Food that is high in trans fat, such as some fried foods. Food that is high in saturated fat, such as fatty meat. Desserts and other sweets, sugary drinks, and other foods with added sugar. Full-fat dairy products. Do not salt foods before eating. Do not eat more than 4 egg yolks a week. Try to eat at least 2 vegetarian meals a week. Eat more home-cooked food and less restaurant, buffet, and fast food.  Lifestyle When eating at a restaurant, ask that your food be prepared with less salt or no salt, if possible. If you drink alcohol: Limit how much you use to: 0-1 drink a day for women who are not pregnant. 0-2 drinks a day for men. Be aware of how much alcohol is in your drink. In the [place] one drink equals one 12 oz bottle of beer (355 mL), one 5 oz glass of wine (148 mL), or one 1 oz glass of hard liquor (44 mL). General information Avoid eating more than 2,300 mg of salt a day. If you have hypertension, you may need to reduce your sodium intake to 1,500 mg a day. Work with your health care provider to maintain a healthy body weight or to lose weight. Ask what an ideal weight is for you. Get at least 30 minutes of exercise that causes your heart to beat faster (aerobic exercise) most days of the week. Activities may include walking, swimming, or biking. Work with your health care provider or dietitian to adjust your eating plan to your individual calorie needs. What foods should I eat? Fruits All fresh, dried, or frozen fruit. Canned fruit in natural juice (without addedsugar). Vegetables Fresh or frozen vegetables (raw, steamed, roasted, or grilled). Low-sodium or reduced-sodium tomato and vegetable juice. Low-sodium or reduced-sodium tomatosauce and tomato paste. Low-sodium or reduced-sodium canned vegetables. Grains Whole-grain or whole-wheat bread. Whole-grain or whole-wheat pasta. Brown rice. [place]. Bulgur. Whole-grain and low-sodium cereals. Pita bread.Low-fat,  low-sodium crackers. Whole-wheat flour tortillas. Meats and other proteins Skinless chicken or [place]. Ground chicken or [place]. Pork with fat trimmed off. Fish and seafood. Egg whites. Dried beans, peas, or lentils. Unsalted nuts, nut butters, and seeds. Unsalted canned beans. Lean cuts of beef with fat trimmed off. Low-sodium, lean precooked or cured meat, such as sausages or meatloaves. Dairy Low-fat (1%) or fat-free (skim) milk. Reduced-fat, low-fat, or fat-free cheeses. Nonfat, low-sodium ricotta or cottage cheese. Low-fat or nonfatyogurt.  Low-fat, low-sodium cheese. Fats and oils Soft margarine without trans fats. Vegetable oil. Reduced-fat, low-fat, or light mayonnaise and salad dressings (reduced-sodium). Canola, safflower, olive, avocado, soybean, andsunflower oils. Avocado. Seasonings and condiments Herbs. Spices. Seasoning mixes without salt. Other foods Unsalted popcorn and pretzels. Fat-free sweets. The items listed above may not be a complete list of foods and beverages you can eat. Contact a dietitian for more information. What foods should I avoid? Fruits Canned fruit in a light or heavy syrup. Fried fruit. Fruit in cream or buttersauce. Vegetables Creamed or fried vegetables. Vegetables in a cheese sauce. Regular canned vegetables (not low-sodium or reduced-sodium). Regular canned tomato sauce and paste (not low-sodium or reduced-sodium). Regular tomato and vegetable juice(not low-sodium or reduced-sodium). Angie Fava. Olives. Grains Baked goods made with fat, such as croissants, muffins, or some breads. Drypasta or rice meal packs. Meats and other proteins Fatty cuts of meat. Ribs. Fried meat. Berniece Salines. Bologna, salami, and other precooked or cured meats, such as sausages or meat loaves. Fat from the back of a pig (fatback). Bratwurst. Salted nuts and seeds. Canned beans with added salt. Canned orsmoked fish. Whole eggs or egg yolks. Chicken or [place] with skin. Dairy Whole or 2%  milk, cream, and half-and-half. Whole or full-fat cream cheese. Whole-fat or sweetened yogurt. Full-fat cheese. Nondairy creamers. Whippedtoppings. Processed cheese and cheese spreads. Fats and oils Butter. Stick margarine. Lard. Shortening. Ghee. Bacon fat. Tropical oils, suchas coconut, palm kernel, or palm oil. Seasonings and condiments Onion salt, garlic salt, seasoned salt, table salt, and sea salt. Worcestershire sauce. Tartar sauce. Barbecue sauce. Teriyaki sauce. Soy sauce, including reduced-sodium. Steak sauce. Canned and packaged gravies. Fish sauce. Oyster sauce. Cocktail sauce. Store-bought horseradish. Ketchup. Mustard. Meat flavorings and tenderizers. Bouillon cubes. Hot sauces. Pre-made or packaged marinades. Pre-made or packaged taco seasonings. Relishes. Regular saladdressings. Other foods Salted popcorn and pretzels. The items listed above may not be a complete list of foods and beverages you should avoid. Contact a dietitian for more information. Where to find more information National Heart, Lung, and Blood Institute: https://wilson-eaton.com/ American Heart Association: www.heart.org Academy of Nutrition and Dietetics: www.eatright.[place]: www.kidney.org Summary The DASH eating plan is a healthy eating plan that has been shown to reduce high blood pressure (hypertension). It may also reduce your risk for type 2 diabetes, heart disease, and stroke. When on the DASH eating plan, aim to eat more fresh fruits and vegetables, whole grains, lean proteins, low-fat dairy, and heart-healthy fats. With the DASH eating plan, you should limit salt (sodium) intake to 2,300 mg a day. If you have hypertension, you may need to reduce your sodium intake to 1,500 mg a day. Work with your health care provider or dietitian to adjust your eating plan to your individual calorie needs. This information is not intended to replace advice given to you by your health care provider. Make  sure you discuss any questions you have with your healthcare provider. Document Revised: 07/29/2019 Document Reviewed: 07/29/2019 Elsevier Patient Education  2022 [employer].   Consent to CCM Services: Ms. Mccance was given information about Chronic Care Management services including:  CCM service includes personalized support from designated clinical staff supervised by her physician, including individualized plan of care and coordination with other care providers 24/7 contact phone numbers for assistance for urgent and routine care needs. Service will only be billed when office clinical staff spend 20 minutes or more in a month to coordinate care. Only one practitioner may furnish and bill the  service in a calendar month. The patient may stop CCM services at any time (effective at the end of the month) by phone call to the office staff. The patient will be responsible for cost sharing (co-pay) of up to 20% of the service fee (after annual deductible is met).  Patient agreed to services and verbal consent obtained.   Patient verbalizes understanding of instructions provided today and agrees to view in [place].   Telephone follow up appointment with care management team member scheduled for: 06/26/21 at 11:30 AM  Peter Garter RN, BSN,CCM, CDE Care Management Coordinator [facility] [phone], Mobile [phone]   CLINICAL CARE PLAN: Patient Care Plan: Cardiovascular disease Management (HTN and HLD)     Problem Identified: Health Promotion or Disease Self-Management of Cardiovascular disease (HTN and HLD)   Priority: High     Long-Range Goal: Effective Cardiovascular disease (HTN and HLD)Self-Management   Start Date: 04/10/2021  Expected End Date: 09/08/2021  This Visit's Progress: On track  Priority: High  Note:   Current Barriers:  Poorly controlled hyperlipidemia, complicated by HTN Current antihyperlipidemic regimen: none Most recent lipid  panel:     Component Value Date/Time   CHOL 214 (H) 04/23/2020 0914   TRIG 124.0 04/23/2020 0914   HDL 51.90 04/23/2020 0914   CHOLHDL 4 04/23/2020 0914   VLDL 24.8 04/23/2020 0914   LDLCALC 137 (H) 04/23/2020 0914   LDLDIRECT 142.9 03/25/2013 0836   ASCVD risk enhancing conditions: age >19, HTN, current smoker Unable to independently self manage Cardiovascular disease (HTN and HLD) Pt states her lipids are better when she eats oatmeal 5 days a week and walks everyday.  States she tries to eat healthy and cook at home.  States she has not been checking her B/P regularly at home.  States she stress of being a caregiver for her husband is better at this time.  States she would like to get off of her antidepressant and she plans to discuss with Dr. Birdie Riddle about this at her next visit. RN Care Manager Clinical Goal(s):  patient will work with Consulting civil engineer, providers, and care team towards execution of optimized self-health management plan patient will meet with RN Care Manager to address self management of Cardiovascular disease (HTN and HLD) patient will attend all scheduled medical appointments: Dr. Birdie Riddle 05/23/21 patient will verbalize basic understanding of Cardiovascular disease (HTN and HLD) disease process and self health management plan the patient will demonstrate ongoing self health care management ability Interventions: Collaboration with Midge Minium, MD regarding development and update of comprehensive plan of care as evidenced by provider attestation and co-signature Inter-disciplinary care team collaboration (see longitudinal plan of care) Medication review performed; medication list updated in electronic medical record.  Inter-disciplinary care team collaboration (see longitudinal plan of care) Evaluation of current treatment plan related to self management of Cardiovascular disease (HTN and HLD) and patient's adherence to plan as established by provider. Provided  education to patient re: self management of Cardiovascular disease (HTN and HLD) Advised patient, providing education and rationale, to monitor blood pressure 1-2 times a month and record, calling provider for findings outside established parameters.  Provided education to patient re: stroke prevention, s/s of heart attack and stroke, DASH diet, complications of uncontrolled blood pressure Discussed plans with patient for ongoing care management follow up and provided patient with direct contact information for care management team Reviewed to avoid saturated fats, trans-fats and eat more fiber Reviewed to lower fatty foods, red meat, cheese,  milk and increase fiber like whole grains and veggies. Patient Goals/Self-Care Activities:  - change to whole grain breads, cereal, pasta - drink 6 to 8 glasses of water each day - eat 3 to 5 servings of fruits and vegetables each day - eat fish at least once per week - fill half the plate with nonstarchy vegetables - limit fast food meals to no more than 1 per week - manage portion size - read food labels for fat, fiber, carbohydrates and portion size - reduce red meat to 2 to 3 times a week - switch to low-fat or skim milk - be open to making changes - if I have chest pain, call for help check blood pressure 1-2 times a month - choose a place to take my blood pressure (home, clinic or office, retail store) - write blood pressure results in a log or diary  Follow Up Plan: Telephone follow up appointment with care management team member scheduled for: 06/26/21 at 11:30 AM The patient has been provided with contact information for the care management team and has been advised to call with any health related questions or concerns.

## 2021-04-10 NOTE — Chronic Care Management (AMB) (Signed)
Chronic Care Management   CCM RN Visit Note  04/10/2021 Name: Sylvia Stevens MRN: [id number] DOB: 1951/03/05  Subjective: Sylvia Stevens is a 70 y.o. year old female who is a primary care patient of Birdie Riddle, Aundra Millet, MD. The care management team was consulted for assistance with disease management and care coordination needs.    Engaged with patient by telephone for initial visit in response to provider referral for case management and/or care coordination services.   Consent to Services:  The patient was given the following information about Chronic Care Management services today, agreed to services, and gave verbal consent: 1. CCM service includes personalized support from designated clinical staff supervised by the primary care provider, including individualized plan of care and coordination with other care providers 2. 24/7 contact phone numbers for assistance for urgent and routine care needs. 3. Service will only be billed when office clinical staff spend 20 minutes or more in a month to coordinate care. 4. Only one practitioner may furnish and bill the service in a calendar month. 5.The patient may stop CCM services at any time (effective at the end of the month) by phone call to the office staff. 6. The patient will be responsible for cost sharing (co-pay) of up to 20% of the service fee (after annual deductible is met). Patient agreed to services and consent obtained.  Patient agreed to services and verbal consent obtained.   Assessment: Review of patient past medical history, allergies, medications, health status, including review of consultants reports, laboratory and other test data, was performed as part of comprehensive evaluation and provision of chronic care management services.   SDOH (Social Determinants of Health) assessments and interventions performed:  SDOH Interventions    Flowsheet Row Most Recent Value  SDOH Interventions   Food Insecurity Interventions Intervention Not  Indicated  Financial Strain Interventions Intervention Not Indicated  Housing Interventions Intervention Not Indicated  Stress Interventions Intervention Not Indicated  Transportation Interventions Intervention Not Indicated        CCM Care Plan  Allergies  Allergen Reactions   Atorvastatin Hives   Lobster [Shellfish Allergy] Hives   Penicillins Hives   Strawberry Extract Hives   Erythromycin Other (See Comments)    Face feels like it is sunburned.    Flexeril [Cyclobenzaprine]     Vomit    Omnipaque [Iohexol]     Outpatient Encounter Medications as of 04/10/2021  Medication Sig   aspirin 81 MG tablet Take 81 mg by mouth daily.   cholecalciferol (VITAMIN D) 1000 UNITS tablet Take 2,000 Units by mouth daily.    COVID-19 mRNA Vac-TriS, Pfizer, (PFIZER-BIONT COVID-19 VAC-TRIS) SUSP injection Inject into the muscle.   COVID-19 mRNA vaccine, Pfizer, 30 MCG/0.3ML injection INJECT AS DIRECTED   cyanocobalamin 1000 MCG tablet Take 1,000 mcg by mouth daily.   diclofenac (VOLTAREN) 25 MG EC tablet Take 25 mg by mouth 2 (two) times daily.   levothyroxine (SYNTHROID) 88 MCG tablet Take 1 tablet (88 mcg total) by mouth daily before breakfast.   sertraline (ZOLOFT) 50 MG tablet TAKE 1 TABLET(50 MG) BY MOUTH DAILY   No facility-administered encounter medications on file as of 04/10/2021.    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   Lower leg pain 05/01/2015   Visual field scotoma of right eye 03/29/2015   Left leg numbness 03/29/2015   Cervical radicular pain 03/27/2014   Physical  exam, annual 03/22/2012   Bursitis, hip 01/08/2012   Elbow pain 01/08/2012   External hemorrhoid, bleeding 09/26/2011   Hypothyroidism 09/23/2011   Vitamin D deficiency 09/23/2011   Tobacco use disorder 09/23/2011   Essential hypertension    Osteopenia    Arthritis      Conditions to be addressed/monitored:HTN and HLD  Care Plan : Cardiovascular disease Management (HTN and HLD)  Updates made by Dimitri Ped, RN since 04/10/2021 12:00 AM     Problem: Health Promotion or Disease Self-Management of Cardiovascular disease (HTN and HLD)   Priority: High     Long-Range Goal: Effective Cardiovascular disease (HTN and HLD)Self-Management   Start Date: 04/10/2021  Expected End Date: 09/08/2021  This Visit's Progress: On track  Priority: High  Note:   Current Barriers:  Poorly controlled hyperlipidemia, complicated by HTN Current antihyperlipidemic regimen: none Most recent lipid panel:     Component Value Date/Time   CHOL 214 (H) 04/23/2020 0914   TRIG 124.0 04/23/2020 0914   HDL 51.90 04/23/2020 0914   CHOLHDL 4 04/23/2020 0914   VLDL 24.8 04/23/2020 0914   LDLCALC 137 (H) 04/23/2020 0914   LDLDIRECT 142.9 03/25/2013 0836   ASCVD risk enhancing conditions: age >34, HTN, current smoker Unable to independently self manage Cardiovascular disease (HTN and HLD) Pt states her lipids are better when she eats oatmeal 5 days a week and walks everyday.  States she tries to eat healthy and cook at home.  States she has not been checking her B/P regularly at home.  States she stress of being a caregiver for her husband is better at this time.  States she would like to get off of her antidepressant and she plans to discuss with Dr. Birdie Riddle about this at her next visit. RN Care Manager Clinical Goal(s):  patient will work with Consulting civil engineer, providers, and care team towards execution of optimized self-health management plan patient will meet with RN Care Manager to address self management of Cardiovascular disease (HTN and HLD) patient will attend all scheduled medical appointments: Dr. Birdie Riddle 05/23/21 patient will verbalize basic understanding of Cardiovascular disease (HTN and HLD) disease process and self health management plan the patient will demonstrate  ongoing self health care management ability Interventions: Collaboration with Midge Minium, MD regarding development and update of comprehensive plan of care as evidenced by provider attestation and co-signature Inter-disciplinary care team collaboration (see longitudinal plan of care) Medication review performed; medication list updated in electronic medical record.  Inter-disciplinary care team collaboration (see longitudinal plan of care) Evaluation of current treatment plan related to self management of Cardiovascular disease (HTN and HLD) and patient's adherence to plan as established by provider. Provided education to patient re: self management of Cardiovascular disease (HTN and HLD) Advised patient, providing education and rationale, to monitor blood pressure 1-2 times a month and record, calling provider for findings outside established parameters.  Provided education to patient re: stroke prevention, s/s of heart attack and stroke, DASH diet, complications of uncontrolled blood pressure Discussed plans with patient for ongoing care management follow up and provided patient with direct contact information for care management team Reviewed to avoid saturated fats, trans-fats and eat more fiber Reviewed to lower fatty foods, red meat, cheese, milk and increase fiber like whole grains and veggies. Patient Goals/Self-Care Activities:  - change to whole grain breads, cereal, pasta - drink 6 to 8 glasses of water each day - eat 3 to 5 servings of fruits and vegetables each day -  eat fish at least once per week - fill half the plate with nonstarchy vegetables - limit fast food meals to no more than 1 per week - manage portion size - read food labels for fat, fiber, carbohydrates and portion size - reduce red meat to 2 to 3 times a week - switch to low-fat or skim milk - be open to making changes - if I have chest pain, call for help check blood pressure 1-2 times a month - choose  a place to take my blood pressure (home, clinic or office, retail store) - write blood pressure results in a log or diary  Follow Up Plan: Telephone follow up appointment with care management team member scheduled for: 06/26/21 at 11:30 AM The patient has been provided with contact information for the care management team and has been advised to call with any health related questions or concerns.        Plan:Telephone follow up appointment with care management team member scheduled for:  06/26/21 and The patient has been provided with contact information for the care management team and has been advised to call with any health related questions or concerns.  Peter Garter RN, BSN,CCM, CDE Care Management Coordinator [place] [phone], Mobile [phone]

## 2021-04-21 ENCOUNTER — Other Ambulatory Visit: Payer: Self-pay | Admitting: Family Medicine

## 2021-04-21 DIAGNOSIS — F32A Depression, unspecified: Secondary | ICD-10-CM

## 2021-04-25 ENCOUNTER — Encounter: Payer: PPO | Admitting: Family Medicine

## 2021-05-20 ENCOUNTER — Ambulatory Visit (INDEPENDENT_AMBULATORY_CARE_PROVIDER_SITE_OTHER): Payer: PPO | Admitting: *Deleted

## 2021-05-20 DIAGNOSIS — Z0000 Encounter for general adult medical examination without abnormal findings: Secondary | ICD-10-CM

## 2021-05-20 NOTE — Patient Instructions (Signed)
Sylvia Stevens , Thank you for taking time to come for your Medicare Wellness Visit. I appreciate your ongoing commitment to your health goals. Please review the following plan we discussed and let me know if I can assist you in the future.   Screening recommendations/referrals: Colonoscopy: up to date Mammogram: up to date Bone Density: up to date Recommended yearly ophthalmology/optometry visit for glaucoma screening and checkup Recommended yearly dental visit for hygiene and checkup  Vaccinations: Influenza vaccine: up to date Pneumococcal vaccine: up to date Tdap vaccine: up to date Shingles vaccine: Education provided    Advanced directives: Education provided  Conditions/risks identified:   Next appointment: 05-23-2021 1:30 Dr. Birdie Riddle   Preventive Care 63 Years and Older, Female Preventive care refers to lifestyle choices and visits with your health care provider that can promote health and wellness. What does preventive care include? A yearly physical exam. This is also called an annual well check. Dental exams once or twice a year. Routine eye exams. Ask your health care provider how often you should have your eyes checked. Personal lifestyle choices, including: Daily care of your teeth and gums. Regular physical activity. Eating a healthy diet. Avoiding tobacco and drug use. Limiting alcohol use. Practicing safe sex. Taking low-dose aspirin every day. Taking vitamin and mineral supplements as recommended by your health care provider. What happens during an annual well check? The services and screenings done by your health care provider during your annual well check will depend on your age, overall health, lifestyle risk factors, and family history of disease. Counseling  Your health care provider may ask you questions about your: Alcohol use. Tobacco use. Drug use. Emotional well-being. Home and relationship well-being. Sexual activity. Eating habits. History of  falls. Memory and ability to understand (cognition). Work and work Statistician. Reproductive health. Screening  You may have the following tests or measurements: Height, weight, and BMI. Blood pressure. Lipid and cholesterol levels. These may be checked every 5 years, or more frequently if you are over 69 years old. Skin check. Lung cancer screening. You may have this screening every year starting at age 39 if you have a 30-pack-year history of smoking and currently smoke or have quit within the past 15 years. Fecal occult blood test (FOBT) of the stool. You may have this test every year starting at age 7. Flexible sigmoidoscopy or colonoscopy. You may have a sigmoidoscopy every 5 years or a colonoscopy every 10 years starting at age 79. Hepatitis C blood test. Hepatitis B blood test. Sexually transmitted disease (STD) testing. Diabetes screening. This is done by checking your blood sugar (glucose) after you have not eaten for a while (fasting). You may have this done every 1-3 years. Bone density scan. This is done to screen for osteoporosis. You may have this done starting at age 70. Mammogram. This may be done every 1-2 years. Talk to your health care provider about how often you should have regular mammograms. Talk with your health care provider about your test results, treatment options, and if necessary, the need for more tests. Vaccines  Your health care provider may recommend certain vaccines, such as: Influenza vaccine. This is recommended every year. Tetanus, diphtheria, and acellular pertussis (Tdap, Td) vaccine. You may need a Td booster every 10 years. Zoster vaccine. You may need this after age 59. Pneumococcal 13-valent conjugate (PCV13) vaccine. One dose is recommended after age 51. Pneumococcal polysaccharide (PPSV23) vaccine. One dose is recommended after age 65. Talk to your health care provider  about which screenings and vaccines you need and how often you need  them. This information is not intended to replace advice given to you by your health care provider. Make sure you discuss any questions you have with your health care provider. Document Released: 09/21/2015 Document Revised: 05/14/2016 Document Reviewed: 06/26/2015 Elsevier Interactive Patient Education  2017 [place] Prevention in the Home Falls can cause injuries. They can happen to people of all ages. There are many things you can do to make your home safe and to help prevent falls. What can I do on the outside of my home? Regularly fix the edges of walkways and driveways and fix any cracks. Remove anything that might make you trip as you walk through a door, such as a raised step or threshold. Trim any bushes or trees on the path to your home. Use bright outdoor lighting. Clear any walking paths of anything that might make someone trip, such as rocks or tools. Regularly check to see if handrails are loose or broken. Make sure that both sides of any steps have handrails. Any raised decks and porches should have guardrails on the edges. Have any leaves, snow, or ice cleared regularly. Use sand or salt on walking paths during winter. Clean up any spills in your garage right away. This includes oil or grease spills. What can I do in the bathroom? Use night lights. Install grab bars by the toilet and in the tub and shower. Do not use towel bars as grab bars. Use non-skid mats or decals in the tub or shower. If you need to sit down in the shower, use a plastic, non-slip stool. Keep the floor dry. Clean up any water that spills on the floor as soon as it happens. Remove soap buildup in the tub or shower regularly. Attach bath mats securely with double-sided non-slip rug tape. Do not have throw rugs and other things on the floor that can make you trip. What can I do in the bedroom? Use night lights. Make sure that you have a light by your bed that is easy to reach. Do not use  any sheets or blankets that are too big for your bed. They should not hang down onto the floor. Have a firm chair that has side arms. You can use this for support while you get dressed. Do not have throw rugs and other things on the floor that can make you trip. What can I do in the kitchen? Clean up any spills right away. Avoid walking on wet floors. Keep items that you use a lot in easy-to-reach places. If you need to reach something above you, use a strong step stool that has a grab bar. Keep electrical cords out of the way. Do not use floor polish or wax that makes floors slippery. If you must use wax, use non-skid floor wax. Do not have throw rugs and other things on the floor that can make you trip. What can I do with my stairs? Do not leave any items on the stairs. Make sure that there are handrails on both sides of the stairs and use them. Fix handrails that are broken or loose. Make sure that handrails are as long as the stairways. Check any carpeting to make sure that it is firmly attached to the stairs. Fix any carpet that is loose or worn. Avoid having throw rugs at the top or bottom of the stairs. If you do have throw rugs, attach them to the floor  with carpet tape. Make sure that you have a light switch at the top of the stairs and the bottom of the stairs. If you do not have them, ask someone to add them for you. What else can I do to help prevent falls? Wear shoes that: Do not have high heels. Have rubber bottoms. Are comfortable and fit you well. Are closed at the toe. Do not wear sandals. If you use a stepladder: Make sure that it is fully opened. Do not climb a closed stepladder. Make sure that both sides of the stepladder are locked into place. Ask someone to hold it for you, if possible. Clearly mark and make sure that you can see: Any grab bars or handrails. First and last steps. Where the edge of each step is. Use tools that help you move around (mobility aids)  if they are needed. These include: Canes. Walkers. Scooters. Crutches. Turn on the lights when you go into a dark area. Replace any light bulbs as soon as they burn out. Set up your furniture so you have a clear path. Avoid moving your furniture around. If any of your floors are uneven, fix them. If there are any pets around you, be aware of where they are. Review your medicines with your doctor. Some medicines can make you feel dizzy. This can increase your chance of falling. Ask your doctor what other things that you can do to help prevent falls. This information is not intended to replace advice given to you by your health care provider. Make sure you discuss any questions you have with your health care provider. Document Released: 06/21/2009 Document Revised: 01/31/2016 Document Reviewed: 09/29/2014 Elsevier Interactive Patient Education  2017 [employer].

## 2021-05-20 NOTE — Progress Notes (Signed)
Subjective:   Sylvia Stevens is a 70 y.o. female who presents for Medicare Annual (Subsequent) preventive examination.  I connected with  [name] on 05/20/21 by a video enabled telemedicine application and verified that I am speaking with the correct person using two identifiers.   I discussed the limitations of evaluation and management by telemedicine. The patient expressed understanding and agreed to proceed.    Review of Systems     Cardiac Risk Factors include: advanced age (>43mn, >>93women)     Objective:    Today's Vitals   There is no height or weight on file to calculate BMI.  Advanced Directives 05/20/2021 04/10/2021 04/23/2020 07/06/2018  Does Patient Have a Medical Advance Directive? No Yes Yes Yes  Type of Advance Directive Living will Living will Living will Living will  Does patient want to make changes to medical advance directive? - No - Patient declined - -  Would patient like information on creating a medical advance directive? No - Patient declined - - -    Current Medications (verified) Outpatient Encounter Medications as of 05/20/2021  Medication Sig   aspirin 81 MG tablet Take 81 mg by mouth daily.   cholecalciferol (VITAMIN D) 1000 UNITS tablet Take 2,000 Units by mouth daily.    cyanocobalamin 1000 MCG tablet Take 1,000 mcg by mouth daily.   levothyroxine (SYNTHROID) 88 MCG tablet Take 1 tablet (88 mcg total) by mouth daily before breakfast.   sertraline (ZOLOFT) 50 MG tablet TAKE 1 TABLET(50 MG) BY MOUTH DAILY   COVID-19 mRNA Vac-TriS, Pfizer, (PFIZER-BIONT COVID-19 VAC-TRIS) SUSP injection Inject into the muscle. (Patient not taking: Reported on 05/20/2021)   COVID-19 mRNA vaccine, Pfizer, 30 MCG/0.3ML injection INJECT AS DIRECTED (Patient not taking: Reported on 05/20/2021)   diclofenac (VOLTAREN) 25 MG EC tablet Take 25 mg by mouth 2 (two) times daily.   No facility-administered encounter medications on file as of 05/20/2021.    Allergies  (verified) Atorvastatin, Lobster [shellfish allergy], Penicillins, Strawberry extract, Erythromycin, Flexeril [cyclobenzaprine], and Omnipaque [iohexol]   History: Past Medical History:  Diagnosis Date   Arthritis    Endometrial polyp    Hypertension    Osteopenia 09/16/2012   T score -1.3 FRAX 7.6%/0.9%   Thyroid disease    hypothyroid   Past Surgical History:  Procedure Laterality Date   APPENDECTOMY     Bladder Bx     CARPAL TUNNEL RELEASE     X 2   CESAREAN SECTION     X 2   HERNIA REPAIR     TOE SURGERY     TUBAL LIGATION     Family History  Problem Relation Age of Onset   Alzheimer's disease Mother    Heart disease Father    Pancreatic cancer Father    Arthritis Father    Hyperlipidemia Father    Hypertension Father    Cancer Paternal Aunt        Ovarian or Uterine cancer   Aneurysm Paternal Uncle    Social History   Socioeconomic History   Marital status: Married    Spouse name: Not on file   Number of children: Not on file   Years of education: Not on file   Highest education level: Not on file  Occupational History   Not on file  Tobacco Use   Smoking status: Every Day    Packs/day: 0.50    Types: Cigarettes   Smokeless tobacco: Never  Vaping Use   Vaping Use: Never used  Substance and Sexual Activity   Alcohol use: Yes    Alcohol/week: 0.0 standard drinks    Comment: rare   Drug use: No   Sexual activity: Never    Birth control/protection: Surgical  Other Topics Concern   Not on file  Social History Narrative   Lives with husband in a one story home.  Has 2 children.  Works part time as an Biochemist, clinical.  Education: some college.   Social Determinants of Health   Financial Resource Strain: Low Risk    Difficulty of Paying Living Expenses: Not hard at all  Food Insecurity: No Food Insecurity   Worried About Charity fundraiser in the Last Year: Never true   [place] in the Last Year: Never true  Transportation Needs: No  Transportation Needs   Lack of Transportation (Medical): No   Lack of Transportation (Non-Medical): No  Physical Activity: Insufficiently Active   Days of Exercise per Week: 3 days   Minutes of Exercise per Session: 30 min  Stress: No Stress Concern Present   Feeling of Stress : Not at all  Social Connections: Socially Integrated   Frequency of Communication with Friends and Family: Once a week   Frequency of Social Gatherings with Friends and Family: More than three times a week   Attends Religious Services: More than 4 times per year   Active Member of [employer] or Organizations: Yes   Attends Music therapist: More than 4 times per year   Marital Status: Married    Tobacco Counseling Ready to quit: Not Answered Counseling given: Not Answered   Clinical Intake:  Pre-visit preparation completed: Yes  Pain : No/denies pain     Nutritional Risks: None Diabetes: No  How often do you need to have someone help you when you read instructions, pamphlets, or other written materials from your doctor or pharmacy?: 1 - Never  Diabetic?no  Interpreter Needed?: No  Information entered by :: Leroy Kennedy LPN   Activities of Daily Living In your present state of health, do you have any difficulty performing the following activities: 05/20/2021 08/24/2020  Hearing? N N  Vision? N N  Difficulty concentrating or making decisions? N N  Walking or climbing stairs? N N  Dressing or bathing? N N  Doing errands, shopping? N N  Preparing Food and eating ? N -  Using the Toilet? N -  In the past six months, have you accidently leaked urine? N -  Do you have problems with loss of bowel control? N -  Managing your Medications? N -  Managing your Finances? N -  Housekeeping or managing your Housekeeping? N -  Some recent data might be hidden    Patient Care Team: Midge Minium, MD as PCP - General (Family Medicine) Suella Broad, MD as Consulting Physician (Physical  Medicine and Rehabilitation) Philemon Kingdom, MD as Consulting Physician (Internal Medicine) Richmond Campbell, MD as Consulting Physician (Gastroenterology) Dimitri Ped, RN as Case Manager  Indicate any recent Medical Services you may have received from other than [facility] providers in the past year (date may be approximate).     Assessment:   This is a routine wellness examination for George.  Hearing/Vision screen Hearing Screening - Comments:: No hearing issue Vision Screening - Comments:: Up to date Dr. Katy Fitch  Dietary issues and exercise activities discussed: Current Exercise Habits: Home exercise routine, Type of exercise: treadmill (gazelle machine), Time (Minutes): 30, Frequency (Times/Week): 3, Weekly Exercise (Minutes/Week):  90, Intensity: Moderate   Goals Addressed             This Visit's Progress    Patient Stated       Loose weight       Depression Screen PHQ 2/9 Scores 05/20/2021 05/20/2021 04/10/2021 08/24/2020 05/28/2020 04/23/2020 04/22/2019  PHQ - 2 Score 0 0 0 0 2 4 0  PHQ- 9 Score - - - 0 2 14 0    Fall Risk Fall Risk  05/20/2021 04/10/2021 08/24/2020 05/28/2020 04/23/2020  Falls in the past year? 1 1 0 0 1  Number falls in past yr: 0 1 0 0 1  Comment - pulling weeds fell over - - -  Injury with Fall? 0 0 0 0 0  Risk for fall due to : - - No Fall Risks - History of fall(s)  Follow up Falls evaluation completed;Falls prevention discussed;Education provided - - Falls evaluation completed Falls prevention discussed    FALL RISK PREVENTION PERTAINING TO THE HOME:  Any stairs in or around the home? No  If so, are there any without handrails? No  Home free of loose throw rugs in walkways, pet beds, electrical cords, etc? Yes  Adequate lighting in your home to reduce risk of falls? Yes   ASSISTIVE DEVICES UTILIZED TO PREVENT FALLS:  Life alert? No  Use of a cane, walker or w/c? No  Grab bars in the bathroom? No  Shower chair or bench in shower? No   Elevated toilet seat or a handicapped toilet? No   TIMED UP AND GO:  Was the test performed? No .    Cognitive Function: MMSE - Mini Mental State Exam 07/06/2018  Orientation to time 5  Orientation to Place 5  Registration 3  Attention/ Calculation 5  Recall 3  Language- name 2 objects 2  Language- repeat 1  Language- follow 3 step command 3  Language- read & follow direction 1  Write a sentence 1  Copy design 1  Total score 30        Immunizations Immunization History  Administered Date(s) Administered   Fluad Quad(high Dose 65+) 05/28/2020   Influenza Whole 06/10/2012, 06/14/2013   Influenza,inj,Quad PF,6+ Mos 06/05/2014, 05/11/2015   Influenza,inj,quad, With Preservative 05/19/2018   Influenza-Unspecified 05/23/2016, 05/15/2017, 05/19/2018, 04/29/2019   PFIZER Comirnaty(Gray Top)Covid-19 Tri-Sucrose Vaccine 01/07/2021   PFIZER(Purple Top)SARS-COV-2 Vaccination 10/14/2019, 11/09/2019, 07/06/2020   Pneumococcal Conjugate-13 04/28/2017   Pneumococcal Polysaccharide-23 07/06/2018   Tdap 09/08/2010, 07/23/2018   Zoster, Live 06/22/2013    TDAP status: Up to date  Flu Vaccine status: Up to date  Pneumococcal vaccine status: Up to date  Covid-19 vaccine status: Completed vaccines  Qualifies for Shingles Vaccine? Yes   Zostavax completed Yes   Shingrix Completed?: No.    Education has been provided regarding the importance of this vaccine. Patient has been advised to call insurance company to determine out of pocket expense if they have not yet received this vaccine. Advised may also receive vaccine at local pharmacy or Health Dept. Verbalized acceptance and understanding.  Screening Tests Health Maintenance  Topic Date Due   Zoster Vaccines- Shingrix (1 of 2) Never done   INFLUENZA VACCINE  04/08/2021   Hepatitis C Screening  05/28/2021 (Originally 11/13/1968)   MAMMOGRAM  10/25/2021   COLONOSCOPY (Pts 45-40yr Insurance coverage will need to be confirmed)   02/04/2022   DEXA SCAN  10/25/2022   TETANUS/TDAP  07/23/2028   COVID-19 Vaccine  Completed   PNA vac Low Risk  Adult  Completed   HPV VACCINES  Aged Out    Health Maintenance  Health Maintenance Due  Topic Date Due   Zoster Vaccines- Shingrix (1 of 2) Never done   INFLUENZA VACCINE  04/08/2021    Colorectal cancer screening: Type of screening: Colonoscopy. Completed 2013. Repeat every 10 years  Mammogram status: Completed  . Repeat every year  Bone Density status: Completed  . Results reflect: Bone density results: OSTEOPENIA. Repeat every   years.  Lung Cancer Screening: (Low Dose CT Chest recommended if Age 62-80 years, 30 pack-year currently smoking OR have quit w/in 15years.) does qualify.   Lung Cancer Screening Referral:   Additional Screening:  Hepatitis C Screening: does qualify; Completed   Vision Screening: Recommended annual ophthalmology exams for early detection of glaucoma and other disorders of the eye. Is the patient up to date with their annual eye exam?  Yes  Who is the provider or what is the name of the office in which the patient attends annual eye exams? Dr. Katy Fitch If pt is not established with a provider, would they like to be referred to a provider to establish care? No .   Dental Screening: Recommended annual dental exams for proper oral hygiene  Community Resource Referral / Chronic Care Management: CRR required this visit?  No   CCM required this visit?  No      Plan:     I have personally reviewed and noted the following in the patient's chart:   Medical and social history Use of alcohol, tobacco or illicit drugs  Current medications and supplements including opioid prescriptions.  Functional ability and status Nutritional status Physical activity Advanced directives List of other physicians Hospitalizations, surgeries, and ER visits in previous 12 months Vitals Screenings to include cognitive, depression, and falls Referrals and  appointments  In addition, I have reviewed and discussed with patient certain preventive protocols, quality metrics, and best practice recommendations. A written personalized care plan for preventive services as well as general preventive health recommendations were provided to patient.     Leroy Kennedy, LPN   [id number]   Nurse Notes:

## 2021-05-21 ENCOUNTER — Other Ambulatory Visit: Payer: Self-pay | Admitting: Family Medicine

## 2021-05-21 DIAGNOSIS — F32A Depression, unspecified: Secondary | ICD-10-CM

## 2021-05-23 ENCOUNTER — Other Ambulatory Visit: Payer: Self-pay

## 2021-05-23 ENCOUNTER — Ambulatory Visit (INDEPENDENT_AMBULATORY_CARE_PROVIDER_SITE_OTHER): Payer: PPO | Admitting: Family Medicine

## 2021-05-23 ENCOUNTER — Encounter: Payer: Self-pay | Admitting: Family Medicine

## 2021-05-23 VITALS — BP 138/82 | HR 67 | Temp 98.3°F | Resp 16 | Ht 62.5 in | Wt 141.8 lb

## 2021-05-23 DIAGNOSIS — E785 Hyperlipidemia, unspecified: Secondary | ICD-10-CM | POA: Diagnosis not present

## 2021-05-23 DIAGNOSIS — E559 Vitamin D deficiency, unspecified: Secondary | ICD-10-CM | POA: Diagnosis not present

## 2021-05-23 DIAGNOSIS — Z0000 Encounter for general adult medical examination without abnormal findings: Secondary | ICD-10-CM | POA: Diagnosis not present

## 2021-05-23 DIAGNOSIS — Z23 Encounter for immunization: Secondary | ICD-10-CM | POA: Diagnosis not present

## 2021-05-23 NOTE — Progress Notes (Signed)
   Subjective:    Patient ID: Sylvia Stevens, female    DOB: 10/02/1950, 70 y.o.   MRN: [id number]  HPI CPE- UTD on colonoscopy, mammo, BMD, Pneumonia, Tdap.  Due for flu.  Patient Care Team    Relationship Specialty Notifications Start End  Midge Minium, MD PCP - General Family Medicine  11/28/11   Suella Broad, MD Consulting Physician Physical Medicine and Rehabilitation  04/07/14   Philemon Kingdom, MD Consulting Physician Internal Medicine  03/29/15   Richmond Campbell, MD Consulting Physician Gastroenterology  03/29/15   Dimitri Ped, RN Case Manager   03/19/21    Comment: [phone]    Health Maintenance  Topic Date Due   INFLUENZA VACCINE  04/08/2021   Hepatitis C Screening  05/28/2021 (Originally 11/13/1968)   Zoster Vaccines- Shingrix (1 of 2) 08/22/2021 (Originally 11/13/2001)   MAMMOGRAM  10/25/2021   COLONOSCOPY (Pts 45-41yr Insurance coverage will need to be confirmed)  02/04/2022   DEXA SCAN  10/25/2022   TETANUS/TDAP  07/23/2028   COVID-19 Vaccine  Completed   PNA vac Low Risk Adult  Completed   HPV VACCINES  Aged Out      Review of Systems Patient reports no vision/ hearing changes, adenopathy,fever, weight change,  persistant/recurrent hoarseness , swallowing issues, chest pain, palpitations, edema, persistant/recurrent cough, hemoptysis, dyspnea (rest/exertional/paroxysmal nocturnal), gastrointestinal bleeding (melena, rectal bleeding), abdominal pain, significant heartburn, bowel changes, GU symptoms (dysuria, hematuria, incontinence), Gyn symptoms (abnormal  bleeding, pain),  syncope, focal weakness, memory loss, numbness & tingling, skin/hair/nail changes, abnormal bruising or bleeding, anxiety, or depression.   This visit occurred during the SARS-CoV-2 public health emergency.  Safety protocols were in place, including screening questions prior to the visit, additional usage of staff PPE, and extensive cleaning of exam room while observing appropriate  contact time as indicated for disinfecting solutions.      Objective:   Physical Exam General Appearance:    Alert, cooperative, no distress, appears stated age  Head:    Normocephalic, without obvious abnormality, atraumatic  Eyes:    PERRL, conjunctiva/corneas clear, EOM's intact, fundi    benign, both eyes  Ears:    Normal TM's and external ear canals, both ears  Nose:   Deferred due to COVID  Throat:   Neck:   Supple, symmetrical, trachea midline, no adenopathy;    Thyroid: no enlargement/tenderness/nodules  Back:     Symmetric, no curvature, ROM normal, no CVA tenderness  Lungs:     Clear to auscultation bilaterally, respirations unlabored  Chest Wall:    No tenderness or deformity   Heart:    Regular rate and rhythm, S1 and S2 normal, no murmur, rub   or gallop  Breast Exam:    Deferred to mammo  Abdomen:     Soft, non-tender, bowel sounds active all four quadrants,    no masses, no organomegaly  Genitalia:    Deferred  Rectal:    Extremities:   Extremities normal, atraumatic, no cyanosis or edema  Pulses:   2+ and symmetric all extremities  Skin:   Skin color, texture, turgor normal, no rashes or lesions  Lymph nodes:   Cervical, supraclavicular, and axillary nodes normal  Neurologic:   CNII-XII intact, normal strength, sensation and reflexes    throughout          Assessment & Plan:

## 2021-05-23 NOTE — Patient Instructions (Signed)
Follow up in 1 year or as needed We'll notify you of your lab results and make any changes if needed Continue to work on healthy diet and regular exercise- you can do it! Call with any questions or concerns Stay Safe!  Stay Healthy! Happy Fall!! 

## 2021-05-24 DIAGNOSIS — Z0000 Encounter for general adult medical examination without abnormal findings: Secondary | ICD-10-CM | POA: Diagnosis not present

## 2021-05-24 DIAGNOSIS — E559 Vitamin D deficiency, unspecified: Secondary | ICD-10-CM | POA: Diagnosis not present

## 2021-05-24 DIAGNOSIS — Z23 Encounter for immunization: Secondary | ICD-10-CM | POA: Diagnosis not present

## 2021-05-24 DIAGNOSIS — E785 Hyperlipidemia, unspecified: Secondary | ICD-10-CM | POA: Diagnosis not present

## 2021-05-24 NOTE — Assessment & Plan Note (Signed)
Pt's PE WNL.  UTD on colonoscopy, mammo, DEXA, PNA, Tdap.  Flu shot given.  Check labs.  Anticipatory guidance provided.

## 2021-05-24 NOTE — Assessment & Plan Note (Signed)
Check labs and replete prn. 

## 2021-05-24 NOTE — Assessment & Plan Note (Signed)
Ongoing issue.  Attempting to control w/ diet and exercise.  Check labs and start meds prn.

## 2021-05-30 ENCOUNTER — Encounter: Payer: Self-pay | Admitting: Family Medicine

## 2021-06-03 ENCOUNTER — Other Ambulatory Visit: Payer: Self-pay

## 2021-06-03 ENCOUNTER — Encounter: Payer: Self-pay | Admitting: Family Medicine

## 2021-06-03 ENCOUNTER — Other Ambulatory Visit: Payer: PPO

## 2021-06-03 ENCOUNTER — Telehealth: Payer: Self-pay

## 2021-06-03 DIAGNOSIS — E559 Vitamin D deficiency, unspecified: Secondary | ICD-10-CM

## 2021-06-03 DIAGNOSIS — M8580 Other specified disorders of bone density and structure, unspecified site: Secondary | ICD-10-CM

## 2021-06-03 DIAGNOSIS — E785 Hyperlipidemia, unspecified: Secondary | ICD-10-CM

## 2021-06-03 DIAGNOSIS — Z1231 Encounter for screening mammogram for malignant neoplasm of breast: Secondary | ICD-10-CM

## 2021-06-03 NOTE — Telephone Encounter (Signed)
Patient came in for a nurse visit blood work. She previously did not have blood drawn on 05/23/2021 due to the lab running an hour behind. Patient checked in at 7:47, I arrived at at work at 8:01. Around 8:10 Sylvia Stevens informed me that both her lab nurse visits had arrived and needed orders. I ordered Sylvia Stevens's, as soon as I had ordered them Sylvia Stevens informed me that she had left because she did not want to wait any longer. I attempted to call her and get her to come back, no answer. Sylvia Stevens made aware.

## 2021-06-06 NOTE — Telephone Encounter (Signed)
This appointment was fixed for September on 9/26.  I have already spoke to [name] and apologized for the mistake

## 2021-06-20 ENCOUNTER — Other Ambulatory Visit: Payer: Self-pay | Admitting: Family Medicine

## 2021-06-20 DIAGNOSIS — F32A Depression, unspecified: Secondary | ICD-10-CM

## 2021-06-26 ENCOUNTER — Ambulatory Visit (INDEPENDENT_AMBULATORY_CARE_PROVIDER_SITE_OTHER): Payer: PPO

## 2021-06-26 DIAGNOSIS — E559 Vitamin D deficiency, unspecified: Secondary | ICD-10-CM

## 2021-06-26 DIAGNOSIS — E039 Hypothyroidism, unspecified: Secondary | ICD-10-CM

## 2021-06-26 DIAGNOSIS — E785 Hyperlipidemia, unspecified: Secondary | ICD-10-CM

## 2021-06-26 DIAGNOSIS — M8580 Other specified disorders of bone density and structure, unspecified site: Secondary | ICD-10-CM

## 2021-06-26 NOTE — Chronic Care Management (AMB) (Signed)
Chronic Care Management   CCM RN Visit Note  06/26/2021 Name: Sylvia Stevens MRN: [id number] DOB: 11/12/1950  Subjective: Sylvia Stevens is a 70 y.o. year old female who is a primary care patient of Sylvia Stevens, Aundra Millet, MD. The care management team was consulted for assistance with disease management and care coordination needs.    Engaged with patient by telephone for follow up visit in response to provider referral for case management and/or care coordination services.   Consent to Services:  The patient was given information about Chronic Care Management services, agreed to services, and gave verbal consent prior to initiation of services.  Please see initial visit note for detailed documentation.   Patient agreed to services and verbal consent obtained.   Assessment: Review of patient past medical history, allergies, medications, health status, including review of consultants reports, laboratory and other test data, was performed as part of comprehensive evaluation and provision of chronic care management services.   SDOH (Social Determinants of Health) assessments and interventions performed:    CCM Care Plan  Allergies  Allergen Reactions   Atorvastatin Hives   Lobster [Shellfish Allergy] Hives   Penicillins Hives   Strawberry Extract Hives   Erythromycin Other (See Comments)    Face feels like it is sunburned.    Flexeril [Cyclobenzaprine]     Vomit    Omnipaque [Iohexol]     Outpatient Encounter Medications as of 06/26/2021  Medication Sig   aspirin 81 MG tablet Take 81 mg by mouth daily.   cholecalciferol (VITAMIN D) 1000 UNITS tablet Take 2,000 Units by mouth daily.    cyanocobalamin 1000 MCG tablet Take 1,000 mcg by mouth daily.   levothyroxine (SYNTHROID) 88 MCG tablet Take 1 tablet (88 mcg total) by mouth daily before breakfast.   sertraline (ZOLOFT) 50 MG tablet TAKE 1 TABLET(50 MG) BY MOUTH DAILY   No facility-administered encounter medications on file as of  06/26/2021.    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   Lower leg pain 05/01/2015   Visual field scotoma of right eye 03/29/2015   Left leg numbness 03/29/2015   Cervical radicular pain 03/27/2014   Physical exam, annual 03/22/2012   Bursitis, hip 01/08/2012   Elbow pain 01/08/2012   External hemorrhoid, bleeding 09/26/2011   Hypothyroidism 09/23/2011   Vitamin D deficiency 09/23/2011   Tobacco use disorder 09/23/2011   Osteopenia    Arthritis     Conditions to be addressed/monitored:HLD and hypothyroidism osteopenia  Care Plan : Cardiovascular disease Management (HTN and HLD)  Updates made by Dimitri Ped, RN since 06/26/2021 12:00 AM     Problem: Health Promotion or Disease Self-Management of Cardiovascular disease (HTN and HLD)   Priority: High     Long-Range Goal: Effective Cardiovascular disease (HTN and HLD)Self-Management   Start Date: 04/10/2021  Expected End Date: 09/08/2021  This Visit's Progress: On track  Recent Progress: On track  Priority: High  Note:   Current Barriers:  Poorly controlled hyperlipidemia, complicated by hx of HTN, hyppthyroidism Current antihyperlipidemic regimen: none Most recent lipid panel:     Component Value Date/Time   CHOL 214 (H) 04/23/2020 0914   TRIG 124.0 04/23/2020 0914   HDL 51.90 04/23/2020 0914   CHOLHDL 4 04/23/2020 0914   VLDL 24.8 04/23/2020 0914   LDLCALC 137 (H) 04/23/2020 0914   LDLDIRECT 142.9 03/25/2013 0836   ASCVD risk  enhancing conditions: age >89, hx of HTN, current smoker Unable to independently self manage Cardiovascular disease (HTN and HLD) Pt states she  has been eating oatmeal 5 days a week and walks everyday.  States she tries to eat healthy and cook at home.  States she has not been checking her B/P regularly at home.  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  States she is no longer on her antidepressant and her mood has been good.  States she plans to retire at the end of the year and she plans to travel more with her husband.  States she is to get her labs done when she sees Dr. Cruzita Lederer in May [place]):  patient will work with [place], providers, and care team towards execution of optimized self-health management plan patient will meet with RN Care Manager to address self management of Cardiovascular disease (HTN and HLD) patient will attend all scheduled medical appointments: Dr. Birdie Stevens 05/26/22, Dr. Cruzita Lederer 01/25/88 patient will verbalize basic understanding of Cardiovascular disease (HTN and HLD) disease process and self health management plan the patient will demonstrate ongoing self health care management ability Interventions: Collaboration with Midge Minium, MD regarding development and update of comprehensive plan of care as evidenced by provider attestation and co-signature Inter-disciplinary care team collaboration (see longitudinal plan of care) Medication review performed; medication list updated in electronic medical record.  Inter-disciplinary care team collaboration (see longitudinal plan of care) Evaluation of current treatment plan related to self management of Cardiovascular disease (HTN and HLD) and patient's adherence to plan as established by provider. Reinforced education to patient re: self management of Cardiovascular disease (HTN and HLD) Reinforced to monitor blood pressure 1-2 times a month and record, calling provider for findings outside established parameters.  Discussed plans with patient for ongoing care management follow up and provided patient with direct contact information for care management team Reinforced to avoid saturated fats, trans-fats and eat more fiber Reinforced to lower fatty foods, red meat, cheese, milk and increase fiber  like whole grains and veggies. Patient Goals/Self-Care Activities:  - change to whole grain breads, cereal, pasta - drink 6 to 8 glasses of water each day - eat 3 to 5 servings of fruits and vegetables each day - eat fish at least once per week - fill half the plate with nonstarchy vegetables - limit fast food meals to no more than 1 per week - manage portion size - read food labels for fat, fiber, carbohydrates and portion size - reduce red meat to 2 to 3 times a week - switch to low-fat or skim milk - be open to making changes - if I have chest pain, call for help check blood pressure 1-2 times a month Follow Up Plan: Telephone follow up appointment with care management team member scheduled for: 06/26/21 at 11:30 AM The patient has been provided with contact information for the care management team and has been advised to call with any health related questions or concerns.        Plan:Telephone follow up appointment with care management team member scheduled for:  09/18/21 The patient has been provided with contact information for the care management team and has been advised to call with any health related questions or concerns.  Peter Garter RN, BSN,CCM, CDE Care Management Coordinator [place] [phone], Mobile [phone]

## 2021-06-26 NOTE — Patient Instructions (Signed)
Visit Information  PATIENT GOALS:  Goals Addressed             This Visit's Progress    RNCM:Manage My Cholesterol   On track    Timeframe:  Long-Range Goal Priority:  High Start Date:     04/10/21                        Expected End Date:     12/07/21                 Follow Up Date 09/18/21   - change to whole grain breads, cereal, pasta - eat smaller or less servings of red meat - fill half the plate with nonstarchy vegetables - get blood test (fasting) done 1 week before next visit - increase the amount of fiber in food - read food labels for fat and fiber - switch to low-fat or skim milk    Why is this important?   Changing cholesterol starts with eating heart-healthy foods.  Other steps may be to increase your activity and to quit if you smoke.    Notes:      COMPLETED: RNCM:Track and Manage My Blood Pressure-Hypertension   On track    Timeframe:  Long-Range Goal Priority:  Medium Start Date:       04/10/21                      Expected End Date:     09/08/21                  Follow Up Date 06/26/21    - check blood pressure 1-2 times a month - choose a place to take my blood pressure (home, clinic or office, retail store) - write blood pressure results in a log or diary    Why is this important?   You won't feel high blood pressure, but it can still hurt your blood vessels.  High blood pressure can cause heart or kidney problems. It can also cause a stroke.  Making lifestyle changes like losing a little weight or eating less salt will help.  Checking your blood pressure at home and at different times of the day can help to control blood pressure.  If the doctor prescribes medicine remember to take it the way the doctor ordered.  Call the office if you cannot afford the medicine or if there are questions about it.     Notes:         Patient verbalizes understanding of instructions provided today and agrees to view in [place].   Telephone follow up appointment  with care management team member scheduled for: 09/18/21 at 11:30 AM  Peter Garter RN, Jackquline Denmark, CDE Care Management Coordinator [facility] [phone], Mobile [phone]

## 2021-06-28 ENCOUNTER — Ambulatory Visit: Payer: PPO

## 2021-06-28 NOTE — Progress Notes (Signed)
   Covid-19 Vaccination Clinic  Name:  Sylvia Stevens    MRN: [id number] DOB: 1951-04-12  06/28/2021  [name] was observed post Covid-19 immunization for 15 minutes without incident. She was provided with Vaccine Information Sheet and instruction to access the V-Safe system.   [name] was instructed to call 911 with any severe reactions post vaccine: Difficulty breathing  Swelling of face and throat  A fast heartbeat  A bad rash all over body  Dizziness and weakness

## 2021-07-08 DIAGNOSIS — E039 Hypothyroidism, unspecified: Secondary | ICD-10-CM

## 2021-07-08 DIAGNOSIS — E785 Hyperlipidemia, unspecified: Secondary | ICD-10-CM | POA: Diagnosis not present

## 2021-07-25 ENCOUNTER — Encounter: Payer: Self-pay | Admitting: Family Medicine

## 2021-07-29 ENCOUNTER — Encounter: Payer: Self-pay | Admitting: Family Medicine

## 2021-08-19 ENCOUNTER — Encounter: Payer: PPO | Admitting: Family Medicine

## 2021-08-27 ENCOUNTER — Encounter: Payer: Self-pay | Admitting: Family Medicine

## 2021-08-27 ENCOUNTER — Ambulatory Visit (INDEPENDENT_AMBULATORY_CARE_PROVIDER_SITE_OTHER): Payer: PPO | Admitting: Family Medicine

## 2021-08-27 VITALS — BP 160/96 | HR 78 | Temp 98.3°F | Resp 16 | Wt 142.2 lb

## 2021-08-27 DIAGNOSIS — I10 Essential (primary) hypertension: Secondary | ICD-10-CM

## 2021-08-27 LAB — LIPID PANEL
Cholesterol: 233 mg/dL — ABNORMAL HIGH (ref 0–200)
HDL: 57.3 mg/dL (ref >39.00)
LDL Cholesterol: 157 mg/dL — ABNORMAL HIGH (ref 0–99)
NonHDL: 175.3
Total CHOL/HDL Ratio: 4
Triglycerides: 91 mg/dL (ref 0.0–149.0)
VLDL: 18.2 mg/dL (ref 0.0–40.0)

## 2021-08-27 LAB — BASIC METABOLIC PANEL
BUN: 12 mg/dL (ref 6–23)
CO2: 30 mEq/L (ref 19–32)
Calcium: 9.7 mg/dL (ref 8.4–10.5)
Chloride: 104 mEq/L (ref 96–112)
Creatinine, Ser: 0.84 mg/dL (ref 0.40–1.20)
GFR: 70.72 mL/min (ref >60.00)
Glucose, Bld: 81 mg/dL (ref 70–99)
Potassium: 4.5 mEq/L (ref 3.5–5.1)
Sodium: 140 mEq/L (ref 135–145)

## 2021-08-27 LAB — HEPATIC FUNCTION PANEL
ALT: 10 U/L (ref 0–35)
AST: 16 U/L (ref 0–37)
Albumin: 4.2 g/dL (ref 3.5–5.2)
Alkaline Phosphatase: 64 U/L (ref 39–117)
Bilirubin, Direct: 0.1 mg/dL (ref 0.0–0.3)
Total Bilirubin: 0.6 mg/dL (ref 0.2–1.2)
Total Protein: 7 g/dL (ref 6.0–8.3)

## 2021-08-27 LAB — CBC WITH DIFFERENTIAL/PLATELET
Basophils Absolute: 0 10*3/uL (ref 0.0–0.1)
Basophils Relative: 0.8 % (ref 0.0–3.0)
Eosinophils Absolute: 0.1 10*3/uL (ref 0.0–0.7)
Eosinophils Relative: 1.3 % (ref 0.0–5.0)
HCT: 44.5 % (ref 36.0–46.0)
Hemoglobin: 14.5 g/dL (ref 12.0–15.0)
Lymphocytes Relative: 34.2 % (ref 12.0–46.0)
Lymphs Abs: 1.9 10*3/uL (ref 0.7–4.0)
MCHC: 32.5 g/dL (ref 30.0–36.0)
MCV: 93.1 fl (ref 78.0–100.0)
Monocytes Absolute: 0.4 10*3/uL (ref 0.1–1.0)
Monocytes Relative: 7.3 % (ref 3.0–12.0)
Neutro Abs: 3.1 10*3/uL (ref 1.4–7.7)
Neutrophils Relative %: 56.4 % (ref 43.0–77.0)
Platelets: 282 10*3/uL (ref 150.0–400.0)
RBC: 4.78 Mil/uL (ref 3.87–5.11)
RDW: 14.1 % (ref 11.5–15.5)
WBC: 5.4 10*3/uL (ref 4.0–10.5)

## 2021-08-27 LAB — TSH: TSH: 3.08 u[IU]/mL (ref 0.35–5.50)

## 2021-08-27 MED ORDER — LISINOPRIL 10 MG PO TABS: 10.0000 mg | ORAL_TABLET | Freq: Every day | ORAL | 3 refills | Status: DC

## 2021-08-27 NOTE — Progress Notes (Signed)
° °  Subjective:    Patient ID: Sylvia Stevens, female    DOB: 07-Jun-1951, 70 y.o.   MRN: [id number]  HPI HTN- chronic problem.  Was doing a great job controlling w/o medication but home BPs have been as high as 198/113.  Was previously on Lisinopril 10mg  daily.  Was first made aware that BP was elevated while at dentist last week.  Denies CP, SOB, visual changes.  Mild HA.  Pt reports that she has done very well over the last few years w/o medication until this summer when she had COVID.  Pt reports stress levels have actually been down b/c she retired last week.  Admits to increased smoking recently   Review of Systems For ROS see HPI   This visit occurred during the SARS-CoV-2 public health emergency.  Safety protocols were in place, including screening questions prior to the visit, additional usage of staff PPE, and extensive cleaning of exam room while observing appropriate contact time as indicated for disinfecting solutions.      Objective:   Physical Exam Vitals reviewed.  Constitutional:      General: She is not in acute distress.    Appearance: Normal appearance. She is well-developed. She is not ill-appearing.  HENT:     Head: Normocephalic and atraumatic.  Eyes:     Conjunctiva/sclera: Conjunctivae normal.     Pupils: Pupils are equal, round, and reactive to light.  Neck:     Thyroid: No thyromegaly.  Cardiovascular:     Rate and Rhythm: Normal rate and regular rhythm.     Pulses: Normal pulses.     Heart sounds: Normal heart sounds. No murmur heard. Pulmonary:     Effort: Pulmonary effort is normal. No respiratory distress.     Breath sounds: Normal breath sounds.  Abdominal:     General: There is no distension.     Palpations: Abdomen is soft.     Tenderness: There is no abdominal tenderness.  Musculoskeletal:     Cervical back: Normal range of motion and neck supple.     Right lower leg: No edema.     Left lower leg: No edema.  Lymphadenopathy:     Cervical: No  cervical adenopathy.  Skin:    General: Skin is warm and dry.  Neurological:     Mental Status: She is alert and oriented to person, place, and time.  Psychiatric:        Behavior: Behavior normal.          Assessment & Plan:

## 2021-08-27 NOTE — Assessment & Plan Note (Signed)
Deteriorated.  Pt was previously on Lisinopril but this was stopped after she was having hypotensive episodes- particularly in the heat.  BP had been well controlled w/o medication but she has noted that since COVID this summer, BP has been climbing.  Now she is having elevated BPs at home as well as the dentist's office.  Will restart Lisinopril 10mg  daily and monitor BPs for improvement.  Will check labs to ensure no metabolic issues.  Encouraged her to stop smoking as this can also elevate BP.  Pt expressed understanding and is in agreement w/ plan.

## 2021-08-27 NOTE — Patient Instructions (Addendum)
Follow up in 1 month to recheck BP We'll notify you of your lab results and make any changes if needed RESTART the Lisinopril once daily Try and limit your salt intake to improve your BP Drink LOTS of water! Try and quit smoking!! Call with any questions or concerns Stay Safe!  Stay Healthy! Happy Holidays!!

## 2021-08-28 ENCOUNTER — Encounter: Payer: Self-pay | Admitting: Family Medicine

## 2021-08-29 ENCOUNTER — Telehealth: Payer: Self-pay

## 2021-08-29 DIAGNOSIS — E785 Hyperlipidemia, unspecified: Secondary | ICD-10-CM

## 2021-08-29 MED ORDER — EZETIMIBE 10 MG PO TABS: 10.0000 mg | ORAL_TABLET | Freq: Every day | ORAL | 0 refills | Status: DC

## 2021-08-29 NOTE — Telephone Encounter (Signed)
Patient aware of labs and is ok with starting zetia. Will call office is she has a reaction

## 2021-08-29 NOTE — Telephone Encounter (Signed)
-----   Message from Midge Minium, MD sent at 08/28/2021  7:32 AM EST ----- Labs look good w/ exception of total cholesterol and LD- both of which have jumped 20 points.  I know you're allergic to statins, but we will start Zetia 10mg  daily to improve these numbers while working on healthy diet and regular exercise.

## 2021-09-18 ENCOUNTER — Ambulatory Visit (INDEPENDENT_AMBULATORY_CARE_PROVIDER_SITE_OTHER): Payer: PPO

## 2021-09-18 DIAGNOSIS — E785 Hyperlipidemia, unspecified: Secondary | ICD-10-CM

## 2021-09-18 DIAGNOSIS — I10 Essential (primary) hypertension: Secondary | ICD-10-CM

## 2021-09-18 NOTE — Chronic Care Management (AMB) (Signed)
Chronic Care Management   CCM RN Visit Note  09/18/2021 Name: Sylvia Stevens MRN: [id number] DOB: [birth year]  Subjective: Sylvia Stevens is a 71 y.o. year old female who is a primary care patient of Sylvia Stevens, Sylvia Millet, MD. The care management team was consulted for assistance with disease management and care coordination needs.    Engaged with patient by telephone for follow up visit in response to provider referral for case management and/or care coordination services.   Consent to Services:  The patient was given information about Chronic Care Management services, agreed to services, and gave verbal consent prior to initiation of services.  Please see initial visit note for detailed documentation.   Patient agreed to services and verbal consent obtained.   Assessment: Review of patient past medical history, allergies, medications, health status, including review of consultants reports, laboratory and other test data, was performed as part of comprehensive evaluation and provision of chronic care management services.   SDOH (Social Determinants of Health) assessments and interventions performed:    CCM Care Plan  Allergies  Allergen Reactions   Atorvastatin Hives   Lobster [Shellfish Allergy] Hives   Penicillins Hives   Strawberry Extract Hives   Erythromycin Other (See Comments)    Face feels like it is sunburned.    Flexeril [Cyclobenzaprine]     Vomit    Omnipaque [Iohexol]     Outpatient Encounter Medications as of 09/18/2021  Medication Sig   aspirin 81 MG tablet Take 81 mg by mouth daily.   cholecalciferol (VITAMIN D) 1000 UNITS tablet Take 2,000 Units by mouth daily.    cyanocobalamin 1000 MCG tablet Take 1,000 mcg by mouth daily.   ezetimibe (ZETIA) 10 MG tablet Take 1 tablet (10 mg total) by mouth daily.   levothyroxine (SYNTHROID) 88 MCG tablet Take 1 tablet (88 mcg total) by mouth daily before breakfast.   lisinopril (ZESTRIL) 10 MG tablet Take 1 tablet (10 mg total)  by mouth daily.   No facility-administered encounter medications on file as of 09/18/2021.    Patient Active Problem List   Diagnosis Date Noted   Hyperlipidemia 04/28/2017   B12 deficiency 04/28/2017   Hypothyroidism 09/23/2011   Vitamin D deficiency 09/23/2011   Tobacco use disorder 09/23/2011   HTN (hypertension)    Osteopenia    Arthritis     Conditions to be addressed/monitored:HTN and HLD  Care Plan : RN Care Manager Plan of Care  Updates made by Sylvia Ped, RN since 09/18/2021 12:00 AM     Problem: Chronic Disease Management and Care Coordination Needs (HTN and HLD)   Priority: High     Long-Range Goal: Establish Plan of Care for Chronic Disease Management Needs (HTN and HLD)   Start Date: 09/18/2021  Expected End Date: 09/15/2022  Priority: High  Note:   Current Barriers:  Knowledge Deficits related to plan of care for management of HTN and HLD  Chronic Disease Management support and education needs related to HTN and HLD  States that her B/P has been higher since she had COVID and she is now back on B/P medication.  States she is checking her B/P daily and it was 141/90 this morning.  States she has been following a low sodium low fat diet.  States that since she has retired she is now going to [employer] 5 days a week and is doing 150 minutes of cardio and also doing weight training.  State she has cut back her smoking to about 1/2 pack  a day but she does not want to work on completely quitting at this time. RNCM Clinical Goal(s):  Patient will verbalize understanding of plan for management of HTN and HLD as evidenced by voiced adherence to plan of care verbalize basic understanding of  HTN and HLD disease process and self health management plan as evidenced by voiced understanding and teach back take all medications exactly as prescribed and will call provider for medication related questions as evidenced by dispense report and pt verbalization attend all  scheduled medical appointments: Dr. Birdie Stevens 09/27/21, Dr. Cruzita Lederer 01/24/22 as evidenced by medical records demonstrate Improved adherence to prescribed treatment plan for HTN and HLD as evidenced by readings within limits, voiced adherence to plan of care continue to work with RN Care Manager to address care management and care coordination needs related to  HTN and HLD as evidenced by adherence to CM Team Scheduled appointments through collaboration with RN Care manager, provider, and care team.   Interventions: 1:1 collaboration with primary care provider regarding development and update of comprehensive plan of care as evidenced by provider attestation and co-signature Inter-disciplinary care team collaboration (see longitudinal plan of care) Evaluation of current treatment plan related to  self management and patient's adherence to plan as established by provider   Hyperlipidemia Interventions:  (Status:  Goal on track:  Yes.) Long Term Goal Medication review performed; medication list updated in electronic medical record.  Provider established cholesterol goals reviewed Counseled on importance of regular laboratory monitoring as prescribed Reviewed importance of limiting foods high in cholesterol Reviewed exercise goals and target of 150 minutes per week Reviewed to continue to take Zetia as directed  Hypertension Interventions:  (Status:  Goal on track:  Yes.) Long Term Goal Last practice recorded BP readings:  BP Readings from Last 3 Encounters:  08/27/21 (!) 160/96  05/23/21 138/82  01/18/21 (!) 150/90  Most recent eGFR/CrCl: No results found for: EGFR  No components found for: CRCL  Evaluation of current treatment plan related to hypertension self management and patient's adherence to plan as established by provider Provided education to patient re: stroke prevention, s/s of heart attack and stroke Reviewed medications with patient and discussed importance of compliance Discussed  plans with patient for ongoing care management follow up and provided patient with direct contact information for care management team Advised patient, providing education and rationale, to monitor blood pressure daily and record, calling PCP for findings outside established parameters Provided education on prescribed diet low sodium low fat  Smoking Cessation Interventions:  (Status:  Patient declined further engagement on this goal.) Long Term Goal Reviewed smoking history:  Encouraged to continue to decrease number of cigarettes she smokes  Patient Goals/Self-Care Activities: Take all medications as prescribed Attend all scheduled provider appointments Call pharmacy for medication refills 3-7 days in advance of running out of medications Perform all self care activities independently  Perform IADL's (shopping, preparing meals, housekeeping, managing finances) independently Call provider office for new concerns or questions  check blood pressure daily choose a place to take my blood pressure (home, clinic or office, retail store) write blood pressure results in a log or diary take blood pressure log to all doctor appointments take medications for blood pressure exactly as prescribed eat more whole grains, fruits and vegetables, lean meats and healthy fats limit salt intake to 2332m/day call for medicine refill 2 or 3 days before it runs out take all medications exactly as prescribed call doctor with any symptoms you believe are related  to your medicine  Follow Up Plan:  Telephone follow up appointment with care management team member scheduled for:  12/18/21 The patient has been provided with contact information for the care management team and has been advised to call with any health related questions or concerns.       Plan:Telephone follow up appointment with care management team member scheduled for:  12/18/21 The patient has been provided with contact information for the care  management team and has been advised to call with any health related questions or concerns.  Peter Garter RN, BSN,CCM, CDE Care Management Coordinator [place] [phone], Mobile [phone]

## 2021-09-18 NOTE — Patient Instructions (Signed)
Visit Information  Thank you for taking time to visit with me today. Please don't hesitate to contact me if I can be of assistance to you before our next scheduled telephone appointment.  Following are the goals we discussed today:  Take all medications as prescribed Attend all scheduled provider appointments Call pharmacy for medication refills 3-7 days in advance of running out of medications Perform all self care activities independently  Perform IADL's (shopping, preparing meals, housekeeping, managing finances) independently Call provider office for new concerns or questions  check blood pressure daily choose a place to take my blood pressure (home, clinic or office, retail store) write blood pressure results in a log or diary take blood pressure log to all doctor appointments take medications for blood pressure exactly as prescribed eat more whole grains, fruits and vegetables, lean meats and healthy fats limit salt intake to 2300mg /day call for medicine refill 2 or 3 days before it runs out take all medications exactly as prescribed call doctor with any symptoms you believe are related to your medicine  Our next appointment is by telephone on 12/18/21 at 11:30 AM  Please call the care guide team at [phone] if you need to cancel or reschedule your appointment.   If you are experiencing a Mental Health or [place] or need someone to talk to, please call the Suicide and Crisis Lifeline: 988 call the [place] National Suicide Prevention Lifeline: (626)637-2926 or TTY: (803)804-0011 TTY (762)667-1179) to talk to a trained counselor call 1-800-273-TALK (toll free, 24 hour hotline) go to [facility] [address], [place] [phone]) call 911   Patient verbalizes understanding of instructions and care plan provided today and agrees to view in [place]. Active MyChart status confirmed with patient.   Peter Garter RN,  BSN,CCM, CDE Care Management Coordinator [place] [phone], Mobile [phone]

## 2021-09-27 ENCOUNTER — Encounter: Payer: Self-pay | Admitting: Family Medicine

## 2021-09-27 ENCOUNTER — Ambulatory Visit (INDEPENDENT_AMBULATORY_CARE_PROVIDER_SITE_OTHER): Payer: PPO | Admitting: Family Medicine

## 2021-09-27 VITALS — BP 140/82 | HR 60 | Temp 97.2°F | Resp 16 | Wt 140.2 lb

## 2021-09-27 DIAGNOSIS — I10 Essential (primary) hypertension: Secondary | ICD-10-CM

## 2021-09-27 LAB — BASIC METABOLIC PANEL
BUN: 17 mg/dL (ref 6–23)
CO2: 31 mEq/L (ref 19–32)
Calcium: 9.6 mg/dL (ref 8.4–10.5)
Chloride: 104 mEq/L (ref 96–112)
Creatinine, Ser: 0.91 mg/dL (ref 0.40–1.20)
GFR: 64.21 mL/min (ref >60.00)
Glucose, Bld: 81 mg/dL (ref 70–99)
Potassium: 4.4 mEq/L (ref 3.5–5.1)
Sodium: 140 mEq/L (ref 135–145)

## 2021-09-27 MED ORDER — LISINOPRIL 20 MG PO TABS: 20.0000 mg | ORAL_TABLET | Freq: Every day | ORAL | 3 refills | Status: DC

## 2021-09-27 NOTE — Assessment & Plan Note (Signed)
Much improved since last visit but still not at goal.  Will increase lisinopril to 20mg  daily and continue to monitor.  Check BMP due to addition of ACE.  Pt expressed understanding and is in agreement w/ plan.

## 2021-09-27 NOTE — Patient Instructions (Signed)
Follow up in 1 month to recheck BP We'll notify you of your lab results and make any changes if needed INCREASE the Lisinopril to 20mg  daily- 2 of what you have at home and 1 of the new prescription Call with any questions or concerns Stay Safe!  Stay Healthy! Happy New Year!

## 2021-09-27 NOTE — Progress Notes (Signed)
° °  Subjective:    Patient ID: [name], female    DOB: 02-28-1951, 71 y.o.   MRN: [id number]  HPI HTN- last visit BP was 160/96.  She was started on Lisinopril 10mg  daily.  BPs at home just started coming down last week.  No CP, SOB, HAs, visual changes, edema.  No side effects from medication.    Review of Systems For ROS see HPI   This visit occurred during the SARS-CoV-2 public health emergency.  Safety protocols were in place, including screening questions prior to the visit, additional usage of staff PPE, and extensive cleaning of exam room while observing appropriate contact time as indicated for disinfecting solutions.      Objective:   Physical Exam Vitals reviewed.  Constitutional:      General: She is not in acute distress.    Appearance: Normal appearance. She is well-developed. She is not ill-appearing.  HENT:     Head: Normocephalic and atraumatic.  Eyes:     Conjunctiva/sclera: Conjunctivae normal.     Pupils: Pupils are equal, round, and reactive to light.  Neck:     Thyroid: No thyromegaly.  Cardiovascular:     Rate and Rhythm: Normal rate and regular rhythm.     Pulses: Normal pulses.     Heart sounds: Normal heart sounds. No murmur heard. Pulmonary:     Effort: Pulmonary effort is normal. No respiratory distress.     Breath sounds: Normal breath sounds.  Abdominal:     General: There is no distension.     Palpations: Abdomen is soft.     Tenderness: There is no abdominal tenderness.  Musculoskeletal:     Cervical back: Normal range of motion and neck supple.     Right lower leg: No edema.     Left lower leg: No edema.  Lymphadenopathy:     Cervical: No cervical adenopathy.  Skin:    General: Skin is warm and dry.  Neurological:     Mental Status: She is alert and oriented to person, place, and time.  Psychiatric:        Mood and Affect: Mood normal.        Behavior: Behavior normal.          Assessment & Plan:

## 2021-10-08 DIAGNOSIS — E785 Hyperlipidemia, unspecified: Secondary | ICD-10-CM | POA: Diagnosis not present

## 2021-10-08 DIAGNOSIS — I10 Essential (primary) hypertension: Secondary | ICD-10-CM | POA: Diagnosis not present

## 2021-10-29 ENCOUNTER — Ambulatory Visit: Payer: PPO | Admitting: Family Medicine

## 2021-10-30 ENCOUNTER — Ambulatory Visit (INDEPENDENT_AMBULATORY_CARE_PROVIDER_SITE_OTHER): Payer: PPO | Admitting: Family Medicine

## 2021-10-30 ENCOUNTER — Encounter: Payer: Self-pay | Admitting: Family Medicine

## 2021-10-30 VITALS — BP 132/70 | HR 56 | Temp 98.0°F | Resp 16 | Wt 140.6 lb

## 2021-10-30 DIAGNOSIS — I10 Essential (primary) hypertension: Secondary | ICD-10-CM | POA: Diagnosis not present

## 2021-10-30 NOTE — Assessment & Plan Note (Signed)
Chronic problem.  BP much better controlled today.  Pt is asymptomatic.  No med changes at this time but will continue to follow at future visits.

## 2021-10-30 NOTE — Progress Notes (Signed)
° °  Subjective:    Patient ID: Sylvia Stevens, female    DOB: 01/27/1951, 71 y.o.   MRN: [id number]  HPI HTN- at last visit Lisinopril was increased to 20mg  daily.  BP is much better today at 132/70.  Pt reports 'feeling fine'.  Has decreased salt intake considerably.  No CP, SOB, HAs, visual changes, edema.  Pt has been able to exercise and be in the yard w/o difficulty.     Review of Systems For ROS see HPI   This visit occurred during the SARS-CoV-2 public health emergency.  Safety protocols were in place, including screening questions prior to the visit, additional usage of staff PPE, and extensive cleaning of exam room while observing appropriate contact time as indicated for disinfecting solutions.      Objective:   Physical Exam Vitals reviewed.  Constitutional:      General: She is not in acute distress.    Appearance: Normal appearance. She is well-developed.  HENT:     Head: Normocephalic and atraumatic.  Eyes:     Conjunctiva/sclera: Conjunctivae normal.     Pupils: Pupils are equal, round, and reactive to light.  Neck:     Thyroid: No thyromegaly.  Cardiovascular:     Rate and Rhythm: Normal rate and regular rhythm.     Pulses: Normal pulses.     Heart sounds: Normal heart sounds. No murmur heard. Pulmonary:     Effort: Pulmonary effort is normal. No respiratory distress.     Breath sounds: Normal breath sounds.  Abdominal:     General: There is no distension.     Palpations: Abdomen is soft.     Tenderness: There is no abdominal tenderness.  Musculoskeletal:     Cervical back: Normal range of motion and neck supple.     Right lower leg: No edema.     Left lower leg: No edema.  Lymphadenopathy:     Cervical: No cervical adenopathy.  Skin:    General: Skin is warm and dry.  Neurological:     Mental Status: She is alert and oriented to person, place, and time.  Psychiatric:        Behavior: Behavior normal.          Assessment & Plan:

## 2021-10-30 NOTE — Patient Instructions (Signed)
Schedule your complete physical in 6 months Continue the Lisinopril 20mg  daily Continue to drink plenty of water and limit your salt Call with any questions or concerns Stay Safe! Stay Healthy! ENJOY [place]!!

## 2021-11-01 DIAGNOSIS — Z1231 Encounter for screening mammogram for malignant neoplasm of breast: Secondary | ICD-10-CM | POA: Diagnosis not present

## 2021-11-01 LAB — HM MAMMOGRAPHY

## 2021-11-06 ENCOUNTER — Encounter: Payer: Self-pay | Admitting: Family Medicine

## 2021-11-19 ENCOUNTER — Other Ambulatory Visit: Payer: Self-pay | Admitting: Family Medicine

## 2021-11-19 DIAGNOSIS — E785 Hyperlipidemia, unspecified: Secondary | ICD-10-CM

## 2021-12-18 ENCOUNTER — Ambulatory Visit (INDEPENDENT_AMBULATORY_CARE_PROVIDER_SITE_OTHER): Payer: PPO

## 2021-12-18 DIAGNOSIS — E785 Hyperlipidemia, unspecified: Secondary | ICD-10-CM

## 2021-12-18 DIAGNOSIS — I10 Essential (primary) hypertension: Secondary | ICD-10-CM

## 2021-12-18 NOTE — Patient Instructions (Signed)
Visit Information ? ?Thank you for taking time to visit with me today. Please don't hesitate to contact me if I can be of assistance to you before our next scheduled telephone appointment. ? ?Following are the goals we discussed today:  ?Take all medications as prescribed ?Attend all scheduled provider appointments ?Call pharmacy for medication refills 3-7 days in advance of running out of medications ?Perform all self care activities independently  ?Perform IADL's (shopping, preparing meals, housekeeping, managing finances) independently ?Call provider office for new concerns or questions  ?check blood pressure daily ?choose a place to take my blood pressure (home, clinic or office, retail store) ?write blood pressure results in a log or diary ?take blood pressure log to all doctor appointments ?take medications for blood pressure exactly as prescribed ?eat more whole grains, fruits and vegetables, lean meats and healthy fats ?limit salt intake to '2300mg'$ /day ?call for medicine refill 2 or 3 days before it runs out ?take all medications exactly as prescribed ?call doctor with any symptoms you believe are related to your medicine ? ?Our next appointment is by telephone on 04/02/22 at 11:30 AM ? ?Please call the care guide team at [phone] if you need to cancel or reschedule your appointment.  ? ?If you are experiencing a Mental Health or [place] or need someone to talk to, please call the Suicide and Crisis Lifeline: 988 ?call the [place] National Suicide Prevention Lifeline: 847-306-9206 or TTY: (507) 759-5126 TTY 564-209-1012) to talk to a trained counselor ?call 1-800-273-TALK (toll free, 24 hour hotline) ?go to [facility] [address], [place] [phone]) ?call 911  ? ?Patient verbalizes understanding of instructions and care plan provided today and agrees to view in [place]. Active MyChart status confirmed with patient.   ?Peter Garter RN,  BSN,CCM, CDE ?Care Management Coordinator ?[facility] ?[phone]   ?

## 2021-12-18 NOTE — Chronic Care Management (AMB) (Signed)
?Chronic Care Management  ? ?CCM RN Visit Note ? ?12/18/2021 ?Name: Sylvia Stevens MRN: [id number] DOB: 11-10-1950 ? ?Subjective: ?Sylvia Stevens is a 71 y.o. year old female who is a primary care patient of Birdie Riddle, Aundra Millet, MD. The care management team was consulted for assistance with disease management and care coordination needs.   ? ?Engaged with patient by telephone for follow up visit in response to provider referral for case management and/or care coordination services.  ? ?Consent to Services:  ?The patient was given information about Chronic Care Management services, agreed to services, and gave verbal consent prior to initiation of services.  Please see initial visit note for detailed documentation.  ? ?Patient agreed to services and verbal consent obtained.  ? ?Assessment: Review of patient past medical history, allergies, medications, health status, including review of consultants reports, laboratory and other test data, was performed as part of comprehensive evaluation and provision of chronic care management services.  ? ?SDOH (Social Determinants of Health) assessments and interventions performed:   ? ?CCM Care Plan ? ?Allergies  ?Allergen Reactions  ? Atorvastatin Hives  ? Lobster [Shellfish Allergy] Hives  ? Penicillins Hives  ? Strawberry Extract Hives  ? Erythromycin Other (See Comments)  ?  Face feels like it is sunburned.   ? Flexeril [Cyclobenzaprine]   ?  Vomit ?  ? Omnipaque [Iohexol]   ? ? ?Outpatient Encounter Medications as of 12/18/2021  ?Medication Sig  ? aspirin 81 MG tablet Take 81 mg by mouth daily.  ? cholecalciferol (VITAMIN D) 1000 UNITS tablet Take 2,000 Units by mouth daily.   ? cyanocobalamin 1000 MCG tablet Take 1,000 mcg by mouth daily.  ? ezetimibe (ZETIA) 10 MG tablet TAKE 1 TABLET(10 MG) BY MOUTH DAILY  ? levothyroxine (SYNTHROID) 88 MCG tablet Take 1 tablet (88 mcg total) by mouth daily before breakfast.  ? lisinopril (ZESTRIL) 20 MG tablet Take 1 tablet (20 mg total) by mouth  daily.  ? ?No facility-administered encounter medications on file as of 12/18/2021.  ? ? ?Patient Active Problem List  ? Diagnosis Date Noted  ? Hyperlipidemia 04/28/2017  ? B12 deficiency 04/28/2017  ? Hypothyroidism 09/23/2011  ? Vitamin D deficiency 09/23/2011  ? Tobacco use disorder 09/23/2011  ? HTN (hypertension)   ? Osteopenia   ? Arthritis   ? ? ?Conditions to be addressed/monitored:HTN, HLD, and Tobacco Use ? ?Care Plan : RN Care Manager Plan of Care  ?Updates made by Dimitri Ped, RN since 12/18/2021 12:00 AM  ?  ? ?Problem: Chronic Disease Management and Care Coordination Needs (HTN and HLD)   ?Priority: High  ?  ? ?Long-Range Goal: Establish Plan of Care for Chronic Disease Management Needs (HTN and HLD)   ?Start Date: 09/18/2021  ?Expected End Date: 09/15/2022  ?Priority: High  ?Note:   ?Current Barriers:  ?Knowledge Deficits related to plan of care for management of HTN and HLD  ?Chronic Disease Management support and education needs related to HTN and HLD  ?States  her B/P has been good and she is checking once a week now.  States her B/P was 130/70 when she last checked it.  States she has been following a low sodium low fat diet.  States she has been exercising at least 30 minutes a day and gets her 150 minutes a week.  States she is doing more yard work and push mowing her yard.  State she is smoking to about 1/2 pack a day but she does not want to  work on completely quitting at this time. ?RNCM Clinical Goal(s):  ?Patient will verbalize understanding of plan for management of HTN and HLD as evidenced by voiced adherence to plan of care ?verbalize basic understanding of  HTN and HLD disease process and self health management plan as evidenced by voiced understanding and teach back ?take all medications exactly as prescribed and will call provider for medication related questions as evidenced by dispense report and pt verbalization ?attend all scheduled medical appointments: Dr. Birdie Riddle 05/26/22, Dr.  Cruzita Lederer 01/24/22 as evidenced by medical records ?demonstrate Improved adherence to prescribed treatment plan for HTN and HLD as evidenced by readings within limits, voiced adherence to plan of care ?continue to work with RN Care Manager to address care management and care coordination needs related to  HTN and HLD as evidenced by adherence to CM Team Scheduled appointments through collaboration with RN Care manager, provider, and care team.  ? ?Interventions: ?1:1 collaboration with primary care provider regarding development and update of comprehensive plan of care as evidenced by provider attestation and co-signature ?Inter-disciplinary care team collaboration (see longitudinal plan of care) ?Evaluation of current treatment plan related to  self management and patient's adherence to plan as established by provider ? ? ?Hyperlipidemia Interventions:  (Status:  Goal on track:  Yes.) Long Term Goal ?Medication review performed; medication list updated in electronic medical record.  ?Provider established cholesterol goals reviewed ?Counseled on importance of regular laboratory monitoring as prescribed ?Reviewed importance of limiting foods high in cholesterol ?Reviewed exercise goals and target of 150 minutes per week ?Reinforced to continue to take Zetia as directed ? ?Hypertension Interventions:  (Status:  Goal on track:  Yes.) Long Term Goal ?Last practice recorded BP readings:  ?BP Readings from Last 3 Encounters:  ?10/30/21 132/70  ?09/27/21 140/82  ?08/27/21 (!) 160/96  ?Most recent eGFR/CrCl: No results found for: EGFR  No components found for: CRCL ? ?Evaluation of current treatment plan related to hypertension self management and patient's adherence to plan as established by provider ?Provided education to patient re: stroke prevention, s/s of heart attack and stroke ?Reviewed medications with patient and discussed importance of compliance ?Counseled on the importance of exercise goals with target of 150  minutes per week ?Discussed plans with patient for ongoing care management follow up and provided patient with direct contact information for care management team ?Advised patient, providing education and rationale, to monitor blood pressure daily and record, calling PCP for findings outside established parameters ?Provided education on prescribed diet low sodium low fat ?Reviewed to continue checking her B/P at home and to call provider if elevated ? ?Smoking Cessation Interventions:  (Status:  Patient declined further engagement on this goal.) Long Term Goal ?Reviewed smoking history:  ?Reinforced to continue to decrease number of cigarettes she smokes ? ?Patient Goals/Self-Care Activities: ?Take all medications as prescribed ?Attend all scheduled provider appointments ?Call pharmacy for medication refills 3-7 days in advance of running out of medications ?Perform all self care activities independently  ?Perform IADL's (shopping, preparing meals, housekeeping, managing finances) independently ?Call provider office for new concerns or questions  ?check blood pressure daily ?choose a place to take my blood pressure (home, clinic or office, retail store) ?write blood pressure results in a log or diary ?take blood pressure log to all doctor appointments ?take medications for blood pressure exactly as prescribed ?eat more whole grains, fruits and vegetables, lean meats and healthy fats ?limit salt intake to 2323m/day ?call for medicine refill 2 or 3 days  before it runs out ?take all medications exactly as prescribed ?call doctor with any symptoms you believe are related to your medicine ? ?Follow Up Plan:  Telephone follow up appointment with care management team member scheduled for:  04/02/22 ?The patient has been provided with contact information for the care management team and has been advised to call with any health related questions or concerns.   ?  ? ? ?Plan:Telephone follow up appointment with care management  team member scheduled for:  04/02/22 ?The patient has been provided with contact information for the care management team and has been advised to call with any health related questions or concerns.  ?Mel

## 2022-01-05 DIAGNOSIS — I10 Essential (primary) hypertension: Secondary | ICD-10-CM

## 2022-01-05 DIAGNOSIS — E785 Hyperlipidemia, unspecified: Secondary | ICD-10-CM | POA: Diagnosis not present

## 2022-01-24 ENCOUNTER — Ambulatory Visit: Payer: PPO | Admitting: Internal Medicine

## 2022-01-24 ENCOUNTER — Encounter: Payer: Self-pay | Admitting: Internal Medicine

## 2022-01-24 VITALS — BP 120/78 | HR 76 | Ht 62.5 in | Wt 137.8 lb

## 2022-01-24 DIAGNOSIS — E039 Hypothyroidism, unspecified: Secondary | ICD-10-CM | POA: Diagnosis not present

## 2022-01-24 DIAGNOSIS — E559 Vitamin D deficiency, unspecified: Secondary | ICD-10-CM | POA: Diagnosis not present

## 2022-01-24 DIAGNOSIS — E538 Deficiency of other specified B group vitamins: Secondary | ICD-10-CM

## 2022-01-24 LAB — T4, FREE: Free T4: 1.21 ng/dL (ref 0.60–1.60)

## 2022-01-24 LAB — VITAMIN B12: Vitamin B-12: >1504 pg/mL — ABNORMAL HIGH (ref 211–911)

## 2022-01-24 LAB — TSH: TSH: 1.71 u[IU]/mL (ref 0.35–5.50)

## 2022-01-24 LAB — VITAMIN D 25 HYDROXY (VIT D DEFICIENCY, FRACTURES): VITD: 55.24 ng/mL (ref 30.00–100.00)

## 2022-01-24 MED ORDER — LEVOTHYROXINE SODIUM 88 MCG PO TABS: 88.0000 ug | ORAL_TABLET | Freq: Every day | ORAL | 3 refills | Status: DC

## 2022-01-24 NOTE — Progress Notes (Signed)
Subjective:     Patient ID: Sylvia Stevens, female   DOB: 15-Jan-1951, 71 y.o.   MRN: [id number]  This visit occurred during the SARS-CoV-2 public health emergency.  Safety protocols were in place, including screening questions prior to the visit, additional usage of staff PPE, and extensive cleaning of exam room while observing appropriate contact time as indicated for disinfecting solutions.   HPI Sylvia Stevens is a 71 y.o.woman, recently for f/u for well controlled hypothyroidism, dx 2008, and also vitamin B12 and D deficiencies. Last visit was 1 year ago.  Interim history: She denies any sxs today other than bilateral knee pain.  She is undecided about a TKR. She had Covid last summer >> HTN dx'ed after this >> started Lisinopril.  Also, her toes are now turning purple.  No pain.  She has well-controlled hypothyroidism, on stable dose of levothyroxine.  Pt is on levothyroxine 88 mcg daily, taken: - in am - fasting - at least 30 min from b'fast - no Ca, Fe, MVI, + occasionally PPIs later in the day - not in last year - not on Biotin  Reviewed TFTs: Lab Results  Component Value Date   TSH 3.08 08/27/2021   TSH 3.73 01/18/2021   TSH 1.18 04/23/2020   TSH 1.20 01/19/2020   TSH 1.18 04/22/2019   TSH 3.14 01/13/2018   TSH 1.56 04/28/2017   TSH 2.53 12/25/2016   TSH 2.07 04/25/2016   TSH 1.41 12/26/2015   FREET4 1.03 01/18/2021   FREET4 1.36 01/19/2020   FREET4 1.07 01/13/2018   FREET4 1.15 12/25/2016   FREET4 1.13 12/26/2015   FREET4 0.98 12/22/2014   FREET4 1.06 12/01/2013   FREET4 1.11 06/03/2013   FREET4 1.11 02/04/2013   FREET4 1.51 11/16/2012   No FH of thyroid ds. No FH of thyroid cancer. No h/o radiation tx to head or neck. No herbal supplements. No Biotin use. No recent steroids use.   She also has a history of HTN, osteopenia, osteoarthritis, bursitis.   Low B12: We checked her vitamin levels due to her complaints of fatigue.  A B12 level was found to be low in the  normal range so we started her on supplementation.  At last visit, she was taking 2500 mcg daily, so we decreased the dose to 1000 mcg daily.  However, latest vitamin B12 was still elevated: Lab Results  Component Value Date   VITAMINB12 1,002 (H) 01/18/2021   VITAMINB12 >1526 (H) 04/23/2020   VITAMINB12 1,308 (H) 01/19/2020   VITAMINB12 >1500 (H) 04/22/2019   VITAMINB12 >1500 (H) 01/13/2018   VITAMINB12 >1500 (H) 04/28/2017   VITAMINB12 214 12/25/2016   Vitamin D insufficiency: She takes 2000 units vitamin D daily, dose doubled in 01/2020.  Latest vitamin D level was normal: Lab Results  Component Value Date   VD25OH 41.25 01/18/2021   VD25OH 44.24 04/23/2020   VD25OH 22.4 (L) 01/19/2020   VD25OH 38.77 04/22/2019   VD25OH 41.98 01/13/2018   VD25OH 45.14 04/28/2017   VD25OH 37.63 04/25/2016   VD25OH 45.45 03/29/2015   VD25OH 53.36 03/27/2014   VD25OH 28 (L) 12/01/2013    She is also taking fish oil - occasionally.  Her husband has breast cancer. He had RxTx. He also has DM.   Review of Systems + see HPI + joint aches (knees -L>R) - on Voltaren tabs and gel.  I reviewed pt's medications, allergies, PMH, social hx, family hx, and changes were documented in the history of present illness. Otherwise, unchanged from my  initial visit note.  Past Medical History:  Diagnosis Date   Arthritis    Endometrial polyp    Hypertension    Osteopenia 09/16/2012   T score -1.3 FRAX 7.6%/0.9%   Thyroid disease    hypothyroid   Past Surgical History:  Procedure Laterality Date   APPENDECTOMY     Bladder Bx     CARPAL TUNNEL RELEASE     X 2   CESAREAN SECTION     X 2   HERNIA REPAIR     TOE SURGERY     TUBAL LIGATION     Social History   Socioeconomic History   Marital status: Married    Spouse name: Not on file   Number of children: Not on file   Years of education: Not on file   Highest education level: Not on file  Occupational History   Not on file  Tobacco Use    Smoking status: Every Day    Packs/day: 0.50    Types: Cigarettes   Smokeless tobacco: Never  Vaping Use   Vaping Use: Never used  Substance and Sexual Activity   Alcohol use: Yes    Alcohol/week: 0.0 standard drinks    Comment: rare   Drug use: No   Sexual activity: Never    Birth control/protection: Surgical  Other Topics Concern   Not on file  Social History Narrative   Lives with husband in a one story home.  Has 2 children.  Works part time as an Biochemist, clinical.  Education: some college.   Social Determinants of Health   Financial Resource Strain: Low Risk    Difficulty of Paying Living Expenses: Not hard at all  Food Insecurity: No Food Insecurity   Worried About Charity fundraiser in the Last Year: Never true   [place] in the Last Year: Never true  Transportation Needs: No Transportation Needs   Lack of Transportation (Medical): No   Lack of Transportation (Non-Medical): No  Physical Activity: Insufficiently Active   Days of Exercise per Week: 3 days   Minutes of Exercise per Session: 30 min  Stress: No Stress Concern Present   Feeling of Stress : Not at all  Social Connections: Socially Integrated   Frequency of Communication with Friends and Family: Once a week   Frequency of Social Gatherings with Friends and Family: More than three times a week   Attends Religious Services: More than 4 times per year   Active Member of [employer] or Organizations: Yes   Attends Music therapist: More than 4 times per year   Marital Status: Married  Human resources officer Violence: Not At Risk   Fear of Current or Ex-Partner: No   Emotionally Abused: No   Physically Abused: No   Sexually Abused: No   Current Outpatient Medications on File Prior to Visit  Medication Sig Dispense Refill   aspirin 81 MG tablet Take 81 mg by mouth daily.     cholecalciferol (VITAMIN D) 1000 UNITS tablet Take 2,000 Units by mouth daily.      cyanocobalamin 1000 MCG tablet Take  1,000 mcg by mouth daily.     ezetimibe (ZETIA) 10 MG tablet TAKE 1 TABLET(10 MG) BY MOUTH DAILY 90 tablet 0   levothyroxine (SYNTHROID) 88 MCG tablet Take 1 tablet (88 mcg total) by mouth daily before breakfast. 90 tablet 3   lisinopril (ZESTRIL) 20 MG tablet Take 1 tablet (20 mg total) by mouth daily. 30 tablet 3  No current facility-administered medications on file prior to visit.   Allergies  Allergen Reactions   Atorvastatin Hives   Lobster [Shellfish Allergy] Hives   Penicillins Hives   Strawberry Extract Hives   Erythromycin Other (See Comments)    Face feels like it is sunburned.    Flexeril [Cyclobenzaprine]     Vomit    Omnipaque [Iohexol]    Family History  Problem Relation Age of Onset   Alzheimer's disease Mother    Heart disease Father    Pancreatic cancer Father    Arthritis Father    Hyperlipidemia Father    Hypertension Father    Cancer Paternal Aunt        Ovarian or Uterine cancer   Aneurysm Paternal Uncle     Objective:   Physical Exam BP 120/78 (BP Location: Left Arm, Patient Position: Sitting, Cuff Size: Normal)   Pulse 76   Ht 5' 2.5" (1.588 m)   Wt 137 lb 12.8 oz (62.5 kg)   SpO2 92%   BMI 24.80 kg/m   Wt Readings from Last 3 Encounters:  01/24/22 137 lb 12.8 oz (62.5 kg)  10/30/21 140 lb 9.6 oz (63.8 kg)  09/27/21 140 lb 3.2 oz (63.6 kg)   Constitutional: normal weight, in NAD Eyes: PERRLA, EOMI, no exophthalmos ENT: moist mucous membranes, no thyromegaly, no cervical lymphadenopathy Cardiovascular: RRR, No MRG Respiratory: CTA B Musculoskeletal: no deformities, strength intact in all 4 Skin: moist, warm, no rashes Neurological: no tremor with outstretched hands, DTR normal in all 4  Assessment:     1. Hypothyroidism  - on  stable  dose of levothyroxine generic since diagnosis  2.  Low vitamin B12  3.  Vitamin D deficiency     Plan:     1. Patient with long history of controlled hypothyroidism, on generic levothyroxine -  latest thyroid labs reviewed with pt. >> normal: Lab Results  Component Value Date   TSH 3.08 08/27/2021  - she continues on LT4 88 mcg daily - pt feels good on this dose. - we discussed about taking the thyroid hormone every day, with water, >30 minutes before breakfast, separated by >4 hours from acid reflux medications, calcium, iron, multivitamins. Pt. is taking it correctly. - will check thyroid tests today: TSH and fT4 - If labs are abnormal, she will need to return for repeat TFTs in 1.5 months  2.  Low vitamin B12 -At last visit, vitamin B12 was high, at 1308, so we decreased the dose from 2500 to 1000 mcg daily -At last visit, the vitamin B12 level was only slightly high, at 1002.  We did not change her dose -She continues on 1000 mcg daily -We will recheck the level today  3.  Vitamin D deficiency  -At last visit, vitamin D was low, at 22.4 and we increased the dose from 1000 to 2000 units daily -Vitamin D level was normal at last visit, at 41.25 -She continues on 2000 units vitamin D daily -We will recheck the level today  Needs refills - hold.  Component     Latest Ref Rng 01/24/2022  Vitamin B12     211 - 911 pg/mL >1504 (H)   TSH     0.35 - 5.50 uIU/mL 1.71   VITD     30.00 - 100.00 ng/mL 55.24   T4,Free(Direct)     0.60 - 1.60 ng/dL 1.21     Thyroid tests and vitamin D are normal.  B12 is too high.  I  will advise him to take the B12 supplement every other day.  Philemon Kingdom, MD PhD [facility]

## 2022-01-24 NOTE — Patient Instructions (Signed)
Please stop at the lab.  Continue Levothyroxine 88 mcg daily.  Take the thyroid hormone every day, with water, at least 30 minutes before breakfast, separated by at least 4 hours from: - acid reflux medications - calcium - iron - multivitamins  Please continue: - Vitamin D 2000 units daily - Vitamin B12 1000 mcg daily  Please return in 1 year.

## 2022-02-10 ENCOUNTER — Other Ambulatory Visit: Payer: Self-pay

## 2022-02-10 DIAGNOSIS — I10 Essential (primary) hypertension: Secondary | ICD-10-CM

## 2022-02-10 MED ORDER — LISINOPRIL 20 MG PO TABS: 20.0000 mg | ORAL_TABLET | Freq: Every day | ORAL | 3 refills | Status: DC

## 2022-02-11 ENCOUNTER — Other Ambulatory Visit: Payer: Self-pay

## 2022-02-11 DIAGNOSIS — I10 Essential (primary) hypertension: Secondary | ICD-10-CM

## 2022-02-11 MED ORDER — LISINOPRIL 20 MG PO TABS: 20.0000 mg | ORAL_TABLET | Freq: Every day | ORAL | 3 refills | Status: DC

## 2022-02-13 ENCOUNTER — Other Ambulatory Visit: Payer: Self-pay

## 2022-02-13 DIAGNOSIS — E785 Hyperlipidemia, unspecified: Secondary | ICD-10-CM

## 2022-02-13 MED ORDER — EZETIMIBE 10 MG PO TABS: ORAL_TABLET | ORAL | 0 refills | Status: DC

## 2022-02-26 ENCOUNTER — Ambulatory Visit: Payer: Self-pay

## 2022-02-26 DIAGNOSIS — E785 Hyperlipidemia, unspecified: Secondary | ICD-10-CM

## 2022-02-26 DIAGNOSIS — I10 Essential (primary) hypertension: Secondary | ICD-10-CM

## 2022-02-26 NOTE — Patient Instructions (Signed)
Visit Information  Thank you for allowing me to share the care management and care coordination services that are available to you as part of your health plan and services through your primary care provider and medical home. Please reach out to me at [phone] if the care management/care coordination team may be of assistance to you in the future.   Johnny Latu RN, BSN,CCM, CDE Care Management Coordinator  [facility] [phone]   

## 2022-02-26 NOTE — Chronic Care Management (AMB) (Cosign Needed)
Chronic Care Management   CCM RN Visit Note  02/26/2022 Name: Sylvia Stevens MRN: [id number] DOB: 05-01-1951  Subjective: Sylvia Stevens is a 71 y.o. year old female who is a primary care patient of Birdie Riddle, Aundra Millet, MD. The care management team was consulted for assistance with disease management and care coordination needs.    Engaged with patient by telephone for follow up visit in response to provider referral for case management and/or care coordination services.   Consent to Services:  The patient was given information about Chronic Care Management services, agreed to services, and gave verbal consent prior to initiation of services.  Please see initial visit note for detailed documentation.   Patient agreed to services and verbal consent obtained.   Assessment: Review of patient past medical history, allergies, medications, health status, including review of consultants reports, laboratory and other test data, was performed as part of comprehensive evaluation and provision of chronic care management services.   SDOH (Social Determinants of Health) assessments and interventions performed:    CCM Care Plan  Allergies  Allergen Reactions   Atorvastatin Hives   Lobster [Shellfish Allergy] Hives   Penicillins Hives   Strawberry Extract Hives   Erythromycin Other (See Comments)    Face feels like it is sunburned.    Flexeril [Cyclobenzaprine]     Vomit    Omnipaque [Iohexol]     Outpatient Encounter Medications as of 02/26/2022  Medication Sig   aspirin 81 MG tablet Take 81 mg by mouth daily.   cholecalciferol (VITAMIN D) 1000 UNITS tablet Take 2,000 Units by mouth daily.    cyanocobalamin 1000 MCG tablet Take 1,000 mcg by mouth daily.   ezetimibe (ZETIA) 10 MG tablet TAKE 1 TABLET(10 MG) BY MOUTH DAILY   levothyroxine (SYNTHROID) 88 MCG tablet Take 1 tablet (88 mcg total) by mouth daily before breakfast.   lisinopril (ZESTRIL) 20 MG tablet Take 1 tablet (20 mg total) by mouth  daily.   No facility-administered encounter medications on file as of 02/26/2022.    Patient Active Problem List   Diagnosis Date Noted   Hyperlipidemia 04/28/2017   B12 deficiency 04/28/2017   Hypothyroidism 09/23/2011   Vitamin D deficiency 09/23/2011   Tobacco use disorder 09/23/2011   HTN (hypertension)    Osteopenia    Arthritis     Conditions to be addressed/monitored:HTN and HLD  Care Plan : RN Care Manager Plan of Care  Updates made by Dimitri Ped, RN since 02/26/2022 12:00 AM  Completed 02/26/2022   Problem: Chronic Disease Management and Care Coordination Needs (HTN and HLD) Resolved 02/26/2022  Priority: High     Long-Range Goal: Establish Plan of Care for Chronic Disease Management Needs (HTN and HLD) Completed 02/26/2022  Start Date: 09/18/2021  Expected End Date: 09/15/2022  Priority: High  Note:   Case closed goals met Current Barriers:  Knowledge Deficits related to plan of care for management of HTN and HLD  Chronic Disease Management support and education needs related to HTN and HLD  States  her B/P has been good and she is checking once a week now.  States her B/P was 130/70 when she last checked it.  States she has been following a low sodium low fat diet.  States she has been exercising at least 30 minutes a day and gets her 150 minutes a week.  States she is doing more yard work and push mowing her yard.  State she is smoking to about 1/2 pack a day but  she does not want to work on completely quitting at this time. RNCM Clinical Goal(s):  Patient will verbalize understanding of plan for management of HTN and HLD as evidenced by voiced adherence to plan of care verbalize basic understanding of  HTN and HLD disease process and self health management plan as evidenced by voiced understanding and teach back take all medications exactly as prescribed and will call provider for medication related questions as evidenced by dispense report and pt  verbalization attend all scheduled medical appointments: Dr. Birdie Riddle 05/26/22, Dr. Cruzita Lederer 01/24/22 as evidenced by medical records demonstrate Improved adherence to prescribed treatment plan for HTN and HLD as evidenced by readings within limits, voiced adherence to plan of care continue to work with RN Care Manager to address care management and care coordination needs related to  HTN and HLD as evidenced by adherence to CM Team Scheduled appointments through collaboration with RN Care manager, provider, and care team.   Interventions: 1:1 collaboration with primary care provider regarding development and update of comprehensive plan of care as evidenced by provider attestation and co-signature Inter-disciplinary care team collaboration (see longitudinal plan of care) Evaluation of current treatment plan related to  self management and patient's adherence to plan as established by provider   Hyperlipidemia Interventions:  (Status:  Goal Met.) Long Term Goal Medication review performed; medication list updated in electronic medical record.  Provider established cholesterol goals reviewed Counseled on importance of regular laboratory monitoring as prescribed Reviewed importance of limiting foods high in cholesterol Reviewed exercise goals and target of 150 minutes per week Reinforced to continue to take Zetia as directed  Hypertension Interventions:  (Status:  Goal Met.) Long Term Goal Last practice recorded BP readings:  BP Readings from Last 3 Encounters:  10/30/21 132/70  09/27/21 140/82  08/27/21 (!) 160/96  Most recent eGFR/CrCl: No results found for: EGFR  No components found for: CRCL  Evaluation of current treatment plan related to hypertension self management and patient's adherence to plan as established by provider Provided education to patient re: stroke prevention, s/s of heart attack and stroke Reviewed medications with patient and discussed importance of compliance Counseled  on the importance of exercise goals with target of 150 minutes per week Discussed plans with patient for ongoing care management follow up and provided patient with direct contact information for care management team Advised patient, providing education and rationale, to monitor blood pressure daily and record, calling PCP for findings outside established parameters Provided education on prescribed diet low sodium low fat Reviewed to continue checking her B/P at home and to call provider if elevated  Smoking Cessation Interventions:  (Status:  Patient declined further engagement on this goal.) Long Term Goal Reviewed smoking history:  Reinforced to continue to decrease number of cigarettes she smokes  Patient Goals/Self-Care Activities: Take all medications as prescribed Attend all scheduled provider appointments Call pharmacy for medication refills 3-7 days in advance of running out of medications Perform all self care activities independently  Perform IADL's (shopping, preparing meals, housekeeping, managing finances) independently Call provider office for new concerns or questions  check blood pressure daily choose a place to take my blood pressure (home, clinic or office, retail store) write blood pressure results in a log or diary take blood pressure log to all doctor appointments take medications for blood pressure exactly as prescribed eat more whole grains, fruits and vegetables, lean meats and healthy fats limit salt intake to 2310m/day call for medicine refill 2 or 3 days before  it runs out take all medications exactly as prescribed call doctor with any symptoms you believe are related to your medicine  Follow Up Plan:  The patient has been provided with contact information for the care management team and has been advised to call with any health related questions or concerns.  No further follow up required: Case closed goals met       Plan:The patient has been provided  with contact information for the care management team and has been advised to call with any health related questions or concerns.  No further follow up required: Case closed goals met Peter Garter RN, [facility], CDE Care Management Coordinator [facility] [phone]

## 2022-04-02 ENCOUNTER — Telehealth: Payer: PPO

## 2022-04-08 ENCOUNTER — Encounter: Payer: Self-pay | Admitting: Family Medicine

## 2022-04-08 ENCOUNTER — Ambulatory Visit (INDEPENDENT_AMBULATORY_CARE_PROVIDER_SITE_OTHER): Payer: PPO | Admitting: Family Medicine

## 2022-04-08 VITALS — BP 124/86 | HR 65 | Temp 97.2°F | Resp 18 | Ht 62.0 in | Wt 133.0 lb

## 2022-04-08 DIAGNOSIS — R112 Nausea with vomiting, unspecified: Secondary | ICD-10-CM

## 2022-04-08 DIAGNOSIS — R110 Nausea: Secondary | ICD-10-CM | POA: Diagnosis not present

## 2022-04-08 DIAGNOSIS — R42 Dizziness and giddiness: Secondary | ICD-10-CM | POA: Diagnosis not present

## 2022-04-08 DIAGNOSIS — G4489 Other headache syndrome: Secondary | ICD-10-CM | POA: Diagnosis not present

## 2022-04-08 DIAGNOSIS — R1111 Vomiting without nausea: Secondary | ICD-10-CM | POA: Diagnosis not present

## 2022-04-08 LAB — POC COVID19 BINAXNOW: SARS Coronavirus 2 Ag: NEGATIVE

## 2022-04-08 LAB — HEPATIC FUNCTION PANEL
ALT: 15 U/L (ref 0–35)
AST: 19 U/L (ref 0–37)
Albumin: 4.3 g/dL (ref 3.5–5.2)
Alkaline Phosphatase: 60 U/L (ref 39–117)
Bilirubin, Direct: 0.1 mg/dL (ref 0.0–0.3)
Total Bilirubin: 0.5 mg/dL (ref 0.2–1.2)
Total Protein: 7.4 g/dL (ref 6.0–8.3)

## 2022-04-08 LAB — CBC WITH DIFFERENTIAL/PLATELET
Basophils Absolute: 0.1 10*3/uL (ref 0.0–0.1)
Basophils Relative: 1 % (ref 0.0–3.0)
Eosinophils Absolute: 0.1 10*3/uL (ref 0.0–0.7)
Eosinophils Relative: 1.6 % (ref 0.0–5.0)
HCT: 43.4 % (ref 36.0–46.0)
Hemoglobin: 14.5 g/dL (ref 12.0–15.0)
Lymphocytes Relative: 31 % (ref 12.0–46.0)
Lymphs Abs: 1.8 10*3/uL (ref 0.7–4.0)
MCHC: 33.3 g/dL (ref 30.0–36.0)
MCV: 93.4 fl (ref 78.0–100.0)
Monocytes Absolute: 0.4 10*3/uL (ref 0.1–1.0)
Monocytes Relative: 7.1 % (ref 3.0–12.0)
Neutro Abs: 3.4 10*3/uL (ref 1.4–7.7)
Neutrophils Relative %: 59.3 % (ref 43.0–77.0)
Platelets: 265 10*3/uL (ref 150.0–400.0)
RBC: 4.65 Mil/uL (ref 3.87–5.11)
RDW: 13.7 % (ref 11.5–15.5)
WBC: 5.8 10*3/uL (ref 4.0–10.5)

## 2022-04-08 LAB — BASIC METABOLIC PANEL
BUN: 16 mg/dL (ref 6–23)
CO2: 27 mEq/L (ref 19–32)
Calcium: 9.9 mg/dL (ref 8.4–10.5)
Chloride: 103 mEq/L (ref 96–112)
Creatinine, Ser: 0.99 mg/dL (ref 0.40–1.20)
GFR: 57.82 mL/min — ABNORMAL LOW (ref >60.00)
Glucose, Bld: 96 mg/dL (ref 70–99)
Potassium: 4.5 mEq/L (ref 3.5–5.1)
Sodium: 138 mEq/L (ref 135–145)

## 2022-04-08 MED ORDER — ONDANSETRON HCL 4 MG PO TABS: 4.0000 mg | ORAL_TABLET | Freq: Three times a day (TID) | ORAL | 0 refills | Status: DC | PRN

## 2022-04-08 NOTE — Progress Notes (Signed)
   Subjective:    Patient ID: Sylvia Stevens, female    DOB: 03/09/1951, 71 y.o.   MRN: [id number]  HPI Dizziness- Woke up at 2:30 w/ the urge to throw up.  Was unable to get out of bed due to severe dizziness.  pt called 911 this morning due to dizziness and vomiting.  Was unable to lift her head off the pillow this morning.  Pt has hx of vertigo 'for years' but 'never had anything like this'.  EMS ruled out MI/CVA and told her it was vertigo, migraine, or COVID.  Pt reports dizziness has improved.  Continues to have severe nausea.  Continues to have bad headache- frontal.  No fevers.  No hx of migraines.  Pt was gardening yesterday.  Denies abd pain.     Review of Systems For ROS see HPI     Objective:   Physical Exam Vitals reviewed.  Constitutional:      General: She is not in acute distress.    Appearance: Normal appearance. She is not ill-appearing.  HENT:     Head: Normocephalic and atraumatic.     Right Ear: Tympanic membrane and ear canal normal.     Left Ear: Tympanic membrane and ear canal normal.  Eyes:     Extraocular Movements: Extraocular movements intact.     Conjunctiva/sclera: Conjunctivae normal.     Pupils: Pupils are equal, round, and reactive to light.  Abdominal:     General: There is no distension.     Palpations: Abdomen is soft.     Tenderness: There is no abdominal tenderness. There is no guarding or rebound.  Musculoskeletal:     Cervical back: Normal range of motion and neck supple.  Lymphadenopathy:     Cervical: No cervical adenopathy.  Skin:    General: Skin is warm and dry.  Neurological:     General: No focal deficit present.     Mental Status: She is alert and oriented to person, place, and time.     Cranial Nerves: No cranial nerve deficit.     Motor: No weakness.     Gait: Gait normal.  Psychiatric:        Mood and Affect: Mood normal.        Behavior: Behavior normal.        Thought Content: Thought content normal.            Assessment & Plan:  N/V- new.  Most likely related to episode of vertigo this morning but will get labs to assess for possible other causes.  COVID test negative.  Zofran given to improve symptoms.  Encouraged fluids and rest.  Reviewed supportive care and red flags that should prompt return.  Pt expressed understanding and is in agreement w/ plan.

## 2022-04-08 NOTE — Patient Instructions (Signed)
Thankfully your COVID test is negative! We'll notify you of your lab results and make any changes if needed USE the Zofran as needed for nausea Drink LOTS of fluids REST! Please notify me of any changes Call with any questions or concerns Hang in there!!

## 2022-04-09 NOTE — Progress Notes (Signed)
Pt seen results via my chart  

## 2022-04-09 NOTE — Telephone Encounter (Signed)
Caller name: Marrianne (pt)  On DPR? :yes/no: Yes  Call back number: [phone]  Provider they see: Dr. Birdie Riddle  Reason for call: Pharmacy is requesting adinformation

## 2022-05-09 ENCOUNTER — Other Ambulatory Visit: Payer: Self-pay

## 2022-05-09 DIAGNOSIS — E785 Hyperlipidemia, unspecified: Secondary | ICD-10-CM

## 2022-05-09 MED ORDER — EZETIMIBE 10 MG PO TABS: ORAL_TABLET | ORAL | 0 refills | Status: DC

## 2022-05-22 ENCOUNTER — Encounter: Payer: Self-pay | Admitting: Family Medicine

## 2022-05-26 ENCOUNTER — Encounter: Payer: Self-pay | Admitting: Gastroenterology

## 2022-05-26 ENCOUNTER — Ambulatory Visit (INDEPENDENT_AMBULATORY_CARE_PROVIDER_SITE_OTHER): Payer: PPO | Admitting: Family Medicine

## 2022-05-26 ENCOUNTER — Encounter: Payer: Self-pay | Admitting: Family Medicine

## 2022-05-26 VITALS — BP 118/60 | HR 54 | Temp 98.1°F | Resp 16 | Ht 62.0 in | Wt 135.2 lb

## 2022-05-26 DIAGNOSIS — Z23 Encounter for immunization: Secondary | ICD-10-CM

## 2022-05-26 DIAGNOSIS — E559 Vitamin D deficiency, unspecified: Secondary | ICD-10-CM

## 2022-05-26 DIAGNOSIS — Z0000 Encounter for general adult medical examination without abnormal findings: Secondary | ICD-10-CM | POA: Diagnosis not present

## 2022-05-26 DIAGNOSIS — Z1211 Encounter for screening for malignant neoplasm of colon: Secondary | ICD-10-CM | POA: Diagnosis not present

## 2022-05-26 DIAGNOSIS — K921 Melena: Secondary | ICD-10-CM

## 2022-05-26 DIAGNOSIS — I10 Essential (primary) hypertension: Secondary | ICD-10-CM | POA: Diagnosis not present

## 2022-05-26 DIAGNOSIS — E538 Deficiency of other specified B group vitamins: Secondary | ICD-10-CM

## 2022-05-26 LAB — CBC WITH DIFFERENTIAL/PLATELET
Basophils Absolute: 0.1 10*3/uL (ref 0.0–0.1)
Basophils Relative: 1 % (ref 0.0–3.0)
Eosinophils Absolute: 0.2 10*3/uL (ref 0.0–0.7)
Eosinophils Relative: 3.4 % (ref 0.0–5.0)
HCT: 40.5 % (ref 36.0–46.0)
Hemoglobin: 13.5 g/dL (ref 12.0–15.0)
Lymphocytes Relative: 30 % (ref 12.0–46.0)
Lymphs Abs: 1.9 10*3/uL (ref 0.7–4.0)
MCHC: 33.3 g/dL (ref 30.0–36.0)
MCV: 92.5 fl (ref 78.0–100.0)
Monocytes Absolute: 0.5 10*3/uL (ref 0.1–1.0)
Monocytes Relative: 8.1 % (ref 3.0–12.0)
Neutro Abs: 3.6 10*3/uL (ref 1.4–7.7)
Neutrophils Relative %: 57.5 % (ref 43.0–77.0)
Platelets: 316 10*3/uL (ref 150.0–400.0)
RBC: 4.37 Mil/uL (ref 3.87–5.11)
RDW: 14.7 % (ref 11.5–15.5)
WBC: 6.2 10*3/uL (ref 4.0–10.5)

## 2022-05-26 LAB — HEPATIC FUNCTION PANEL
ALT: 15 U/L (ref 0–35)
AST: 18 U/L (ref 0–37)
Albumin: 4 g/dL (ref 3.5–5.2)
Alkaline Phosphatase: 61 U/L (ref 39–117)
Bilirubin, Direct: 0.1 mg/dL (ref 0.0–0.3)
Total Bilirubin: 0.6 mg/dL (ref 0.2–1.2)
Total Protein: 7.2 g/dL (ref 6.0–8.3)

## 2022-05-26 LAB — LIPID PANEL
Cholesterol: 188 mg/dL (ref 0–200)
HDL: 58.4 mg/dL (ref >39.00)
LDL Cholesterol: 113 mg/dL — ABNORMAL HIGH (ref 0–99)
NonHDL: 129.33
Total CHOL/HDL Ratio: 3
Triglycerides: 84 mg/dL (ref 0.0–149.0)
VLDL: 16.8 mg/dL (ref 0.0–40.0)

## 2022-05-26 LAB — BASIC METABOLIC PANEL
BUN: 14 mg/dL (ref 6–23)
CO2: 27 mEq/L (ref 19–32)
Calcium: 10 mg/dL (ref 8.4–10.5)
Chloride: 105 mEq/L (ref 96–112)
Creatinine, Ser: 1.05 mg/dL (ref 0.40–1.20)
GFR: 53.83 mL/min — ABNORMAL LOW (ref >60.00)
Glucose, Bld: 79 mg/dL (ref 70–99)
Potassium: 4.9 mEq/L (ref 3.5–5.1)
Sodium: 141 mEq/L (ref 135–145)

## 2022-05-26 LAB — VITAMIN D 25 HYDROXY (VIT D DEFICIENCY, FRACTURES): VITD: 50.22 ng/mL (ref 30.00–100.00)

## 2022-05-26 LAB — VITAMIN B12: Vitamin B-12: 805 pg/mL (ref 211–911)

## 2022-05-26 LAB — TSH: TSH: 0.49 u[IU]/mL (ref 0.35–5.50)

## 2022-05-26 NOTE — Assessment & Plan Note (Signed)
Pt's PE WNL.  UTD on mammo, Tdap, PNA.  Flu shot given today.  Pt to call and schedule colonoscopy.  Check labs.  Anticipatory guidance provided.

## 2022-05-26 NOTE — Assessment & Plan Note (Signed)
Check labs and replete prn. 

## 2022-05-26 NOTE — Assessment & Plan Note (Signed)
Excellent control.  Currently asymptomatic.  Check labs but no anticipated med changes.

## 2022-05-26 NOTE — Patient Instructions (Signed)
Follow up in 1 year or as needed We'll notify you of your lab results and make any changes if needed Keep up the good work on healthy diet and regular exercise- you look great! Call [facility] [phone] to check on the status of the referral Call with any questions or concerns Stay Safe!  Stay Healthy! Happy Fall!!!

## 2022-05-26 NOTE — Progress Notes (Signed)
   Subjective:    Patient ID: Sylvia Stevens, female    DOB: [birth year], 71 y.o.   MRN: [id number]  HPI CPE- UTD on mammo, Tdap, DEXA, PNA.  Due for repeat colonoscopy  Patient Care Team    Relationship Specialty Notifications Start End  Midge Minium, MD PCP - General Family Medicine  11/28/11   Suella Broad, MD Consulting Physician Physical Medicine and Rehabilitation  04/07/14   Philemon Kingdom, MD Consulting Physician Internal Medicine  03/29/15   Richmond Campbell, MD Consulting Physician Gastroenterology  03/29/15     Health Maintenance  Topic Date Due   COLONOSCOPY (Pts 45-8yr Insurance coverage will need to be confirmed)  02/04/2022   INFLUENZA VACCINE  04/08/2022   Hepatitis C Screening  04/09/2023 (Originally 11/13/1968)   DEXA SCAN  10/25/2022   MAMMOGRAM  11/01/2022   TETANUS/TDAP  07/23/2028   Pneumonia Vaccine 70 Years old  Completed   HPV VACCINES  Aged Out   COVID-19 Vaccine  Discontinued   Zoster Vaccines- Shingrix  Discontinued      Review of Systems Patient reports no vision/ hearing changes, adenopathy,fever, weight change,  persistant/recurrent hoarseness , swallowing issues, chest pain, palpitations, edema, persistant/recurrent cough, hemoptysis, dyspnea (rest/exertional/paroxysmal nocturnal), abdominal pain, significant heartburn, bowel changes, GU symptoms (dysuria, hematuria, incontinence), Gyn symptoms (abnormal  bleeding, pain),  syncope, focal weakness, memory loss, numbness & tingling, skin/hair/nail changes, abnormal bruising, anxiety, or depression.   + blood in stool 1-2 weeks ago w/ severe abdominal cramping.  None since.    Objective:   Physical Exam General Appearance:    Alert, cooperative, no distress, appears stated age  Head:    Normocephalic, without obvious abnormality, atraumatic  Eyes:    PERRL, conjunctiva/corneas clear, EOM's intact both eyes  Ears:    Normal TM's and external ear canals, both ears  Nose:   Nares normal, septum  midline, mucosa normal, no drainage    or sinus tenderness  Throat:   Lips, mucosa, and tongue normal; teeth and gums normal  Neck:   Supple, symmetrical, trachea midline, no adenopathy;    Thyroid: no enlargement/tenderness/nodules  Back:     Symmetric, no curvature, ROM normal, no CVA tenderness  Lungs:     Clear to auscultation bilaterally, respirations unlabored  Chest Wall:    No tenderness or deformity   Heart:    Regular rate and rhythm, S1 and S2 normal, no murmur, rub   or gallop  Breast Exam:    Deferred to mammo  Abdomen:     Soft, non-tender, bowel sounds active all four quadrants,    no masses, no organomegaly  Genitalia:    Deferred to GYN  Rectal:    Extremities:   Extremities normal, atraumatic, no cyanosis or edema  Pulses:   2+ and symmetric all extremities  Skin:   Skin color, texture, turgor normal, no rashes or lesions  Lymph nodes:   Cervical, supraclavicular, and axillary nodes normal  Neurologic:   CNII-XII intact, normal strength, sensation and reflexes    throughout          Assessment & Plan:

## 2022-06-09 ENCOUNTER — Encounter: Payer: Self-pay | Admitting: Family Medicine

## 2022-06-18 ENCOUNTER — Other Ambulatory Visit (HOSPITAL_BASED_OUTPATIENT_CLINIC_OR_DEPARTMENT_OTHER): Payer: Self-pay

## 2022-06-18 MED ORDER — COVID-19 MRNA 2023-2024 VACCINE (COMIRNATY) 0.3 ML INJECTION
INTRAMUSCULAR | 0 refills | Status: DC
  Filled 2022-06-18: qty 0.3, 1d supply, fill #0

## 2022-07-02 ENCOUNTER — Encounter: Payer: Self-pay | Admitting: Gastroenterology

## 2022-07-02 ENCOUNTER — Ambulatory Visit: Payer: PPO | Admitting: Gastroenterology

## 2022-07-02 VITALS — BP 128/64 | HR 68 | Ht 62.0 in | Wt 140.1 lb

## 2022-07-02 DIAGNOSIS — Z86010 Personal history of colonic polyps: Secondary | ICD-10-CM

## 2022-07-02 DIAGNOSIS — K5900 Constipation, unspecified: Secondary | ICD-10-CM

## 2022-07-02 DIAGNOSIS — K921 Melena: Secondary | ICD-10-CM

## 2022-07-02 MED ORDER — NA SULFATE-K SULFATE-MG SULF 17.5-3.13-1.6 GM/177ML PO SOLN: 1.0000 | Freq: Once | ORAL | 0 refills | Status: AC

## 2022-07-02 NOTE — Progress Notes (Signed)
HPI : Sylvia Stevens is a very pleasant 71 year old female with a history of hypertension, osteopenia and chronic tobacco use who is referred to [place] by Dr. Annye Asa following an episode of abdominal pain with hematochezia.  Patient states that a little over a month ago she awoke from sleep with severe crampy abdominal pain (10/10).  She had the urge to defecate and initially could not.  She reports feeling diaphoretic and sweaty and lightheaded.  Finally she was able to have a bowel movement, followed by several more bowel movements.  The first bowel movement was large-volume brown stool, but the second and third bowel movements contained copious amounts of blood with clots.  The pain improved, but she continued to have crampy pain for the next 2 days, although not as severe.  She passed more blood clots in the next day, but then did not have a bowel movement for several days. She reports seeing blood in her stool many years ago attributed to hemorrhoids.  This was only for a day or 2.  Otherwise, she denies ever seeing blood in the stool.  She has not seen any blood since the day after her episode. She does have chronic constipation.  She typically has a bowel movement every 3 to 4 days.  Her stools are typically small and hard and difficult to pass.  She does not typically have problems with abdominal pain except when she goes several days without a bowel movement.  Diarrhea is not typically a problem for her. She tried taking Ex-Lax on occasion, but it caused significant crampy pain.  She has not tried any other laxatives.  With regards to her hematochezia episode, she reports feeling fine the day before this happened.  She denies engaging in any heavy physical exertion or being dehydrated.  She does report she has had episodes of feeling lightheaded and weak in the past when she was working out in her yard and hot weather, but denies this happening in the days leading up to her episode.  She does  take her blood pressure medicine at night before bed.  She has had 2 previous colonoscopies by Dr. Earlean Shawl, most recently in 2013 in which an 8 mm rectal tubular adenoma was removed.  She was recommended repeat in 5 years.  Her initial colonoscopy in 2008 was normal.  She had mild sigmoid diverticulosis on both colonoscopies. She has no family history of colon cancer.    Colonoscopy 2013 (Dr. Earlean Shawl):  8 mm rectal polyp (tubular adenoma), sigmoid diverticulosis, int hemorrhoids  Colonoscopy 2008 (Dr. Earlean Shawl):  Sigmoid diverticulosis, no polyps, recommended repeat 5 years (unclear indication)  Past Medical History:  Diagnosis Date   Arthritis    Endometrial polyp    Hypertension    Osteopenia 09/16/2012   T score -1.3 FRAX 7.6%/0.9%   Thyroid disease    hypothyroid     Past Surgical History:  Procedure Laterality Date   APPENDECTOMY     Bladder Bx     CARPAL TUNNEL RELEASE     X 2   CESAREAN SECTION     X 2   HERNIA REPAIR     TOE SURGERY     TUBAL LIGATION     Family History  Problem Relation Age of Onset   Alzheimer's disease Mother    Heart disease Father    Pancreatic cancer Father    Arthritis Father    Hyperlipidemia Father    Hypertension Father    Cancer Paternal Aunt  Ovarian or Uterine cancer   Aneurysm Paternal Uncle    Social History   Tobacco Use   Smoking status: Every Day    Packs/day: 0.50    Types: Cigarettes   Smokeless tobacco: Never  Vaping Use   Vaping Use: Never used  Substance Use Topics   Alcohol use: Yes    Alcohol/week: 0.0 standard drinks of alcohol    Comment: rare   Drug use: No   Current Outpatient Medications  Medication Sig Dispense Refill   aspirin 81 MG tablet Take 81 mg by mouth daily.     cholecalciferol (VITAMIN D) 1000 UNITS tablet Take 2,000 Units by mouth daily.      COVID-19 mRNA vaccine 2023-2024 (COMIRNATY) SUSP injection Inject into the muscle. 0.3 mL 0   cyanocobalamin 1000 MCG tablet Take 1,000 mcg by  mouth daily.     ezetimibe (ZETIA) 10 MG tablet TAKE 1 TABLET(10 MG) BY MOUTH DAILY 90 tablet 0   levothyroxine (SYNTHROID) 88 MCG tablet Take 1 tablet (88 mcg total) by mouth daily before breakfast. 90 tablet 3   lisinopril (ZESTRIL) 20 MG tablet Take 1 tablet (20 mg total) by mouth daily. 30 tablet 3   No current facility-administered medications for this visit.   Allergies  Allergen Reactions   Atorvastatin Hives   Lobster [Shellfish Allergy] Hives   Penicillins Hives   Strawberry Extract Hives   Erythromycin Other (See Comments)    Face feels like it is sunburned.    Flexeril [Cyclobenzaprine]     Vomit    Omnipaque [Iohexol]      Review of Systems: All systems reviewed and negative except where noted in HPI.    No results found.  Physical Exam: BP 128/64   Pulse 68   Ht '5\' 2"'$  (1.575 m)   Wt 140 lb 2 oz (63.6 kg)   BMI 25.63 kg/m  Constitutional: Pleasant,well-developed, Caucasian female in no acute distress. HEENT: Normocephalic and atraumatic. Conjunctivae are normal. No scleral icterus. Neck supple.  Cardiovascular: Normal rate, regular rhythm.  Pulmonary/chest: Effort normal and breath sounds normal. No wheezing, rales or rhonchi. Abdominal: Soft, nondistended, nontender. Bowel sounds active throughout. There are no masses palpable. No hepatomegaly. Extremities: no edema Neurological: Alert and oriented to person place and time. Skin: Skin is warm and dry. No rashes noted. Psychiatric: Normal mood and affect. Behavior is normal.  CBC    Component Value Date/Time   WBC 6.2 05/26/2022 0934   RBC 4.37 05/26/2022 0934   HGB 13.5 05/26/2022 0934   HCT 40.5 05/26/2022 0934   PLT 316.0 05/26/2022 0934   MCV 92.5 05/26/2022 0934   MCHC 33.3 05/26/2022 0934   RDW 14.7 05/26/2022 0934   LYMPHSABS 1.9 05/26/2022 0934   MONOABS 0.5 05/26/2022 0934   EOSABS 0.2 05/26/2022 0934   BASOSABS 0.1 05/26/2022 0934    CMP     Component Value Date/Time   NA 141  05/26/2022 0934   K 4.9 05/26/2022 0934   CL 105 05/26/2022 0934   CO2 27 05/26/2022 0934   GLUCOSE 79 05/26/2022 0934   BUN 14 05/26/2022 0934   CREATININE 1.05 05/26/2022 0934   CALCIUM 10.0 05/26/2022 0934   PROT 7.2 05/26/2022 0934   ALBUMIN 4.0 05/26/2022 0934   AST 18 05/26/2022 0934   ALT 15 05/26/2022 0934   ALKPHOS 61 05/26/2022 0934   BILITOT 0.6 05/26/2022 0934     ASSESSMENT AND PLAN: 71 year old female with history of tubular adenoma in 2013, overdue  for surveillance colonoscopy.  She had an episode of severe abdominal pain with hematochezia over a month ago.  Her description of her symptoms seem suggestive of ischemic colitis.  She has risk factors for ischemic colitis to include chronic tobacco use and hypertension on medications.  No prior history of similar symptoms.  We discussed the pathophysiology of ischemic colitis.  I recommended that she make sure she avoids dehydration, and always drinks plenty of water.  I advised smoking cessation.  I do not think there is any further need to change medications based on the single episode.  She needs a colonoscopy for polyp surveillance, and we will also exclude any other causes of her hematochezia episode such as mass lesion. For her chronic constipation, recommend she start taking Metamucil on a daily basis.  If she is not experiencing any improvement in her stool volume and frequency after a week or 2, I would recommend she add MiraLAX as needed to achieve a bowel movement about every 1 to 2 days.  History of colon polyp (8 mm TA, 2013) - Colonoscopy  Isolated abdominal pain with hematochezia, suspect ischemic colitis -Colonoscopy to exclude mass lesion  Constipation - Metamucil daily - PRN MiraLax  The details, risks (including bleeding, perforation, infection, missed lesions, medication reactions and possible hospitalization or surgery if complications occur), benefits, and alternatives to colonoscopy with possible  biopsy and possible polypectomy were discussed with the patient and she consents to proceed.   Elwyn Klosinski E. Candis Schatz, MD [facility]   CC:  Midge Minium, MD

## 2022-07-02 NOTE — Patient Instructions (Signed)
_______________________________________________________  If you are age 69 or older, your body mass index should be between 23-30. Your Body mass index is 25.63 kg/m. If this is out of the aforementioned range listed, please consider follow up with your Primary Care Provider.  If you are age 57 or younger, your body mass index should be between 19-25. Your Body mass index is 25.63 kg/m. If this is out of the aformentioned range listed, please consider follow up with your Primary Care Provider.   You have been scheduled for a colonoscopy. Please follow written instructions given to you at your visit today.  Please pick up your prep supplies at the pharmacy within the next 1-3 days. If you use inhalers (even only as needed), please bring them with you on the day of your procedure.  Start Metamucil daily.   Miralax as needed.   The [facility] providers would like to encourage you to use [facility] to communicate with providers for non-urgent requests or questions.  Due to long hold times on the telephone, sending your provider a message by [facility] may be a faster and more efficient way to get a response.  Please allow 48 business hours for a response.  Please remember that this is for non-urgent requests.   It was a pleasure to see you today!  Thank you for trusting me with your gastrointestinal care!

## 2022-07-10 ENCOUNTER — Ambulatory Visit (INDEPENDENT_AMBULATORY_CARE_PROVIDER_SITE_OTHER): Payer: PPO

## 2022-07-10 VITALS — Ht 62.0 in | Wt 134.0 lb

## 2022-07-10 DIAGNOSIS — Z0000 Encounter for general adult medical examination without abnormal findings: Secondary | ICD-10-CM | POA: Diagnosis not present

## 2022-07-10 NOTE — Progress Notes (Signed)
Subjective:   Sylvia Stevens is a 71 y.o. female who presents for Medicare Annual (Subsequent) preventive examination.   I connected with  [name] on 07/10/22 by a audio enabled telemedicine application and verified that I am speaking with the correct person using two identifiers.  Patient Location: Home  Provider Location: Home Office  I discussed the limitations of evaluation and management by telemedicine. The patient expressed understanding and agreed to proceed.  Review of Systems     Cardiac Risk Factors include: advanced age (>47mn, >>49women);hypertension     Objective:    Today's Vitals   07/10/22 1034  Weight: 134 lb (60.8 kg)  Height: '5\' 2"'$  (1.575 m)   Body mass index is 24.51 kg/m.     05/20/2021    8:34 AM 04/10/2021   11:00 AM 04/23/2020    8:11 AM 07/06/2018    9:14 AM  Advanced Directives  Does Patient Have a Medical Advance Directive? No Yes Yes Yes  Type of Advance Directive Living will Living will Living will Living will  Does patient want to make changes to medical advance directive?  No - Patient declined    Would patient like information on creating a medical advance directive? No - Patient declined       Current Medications (verified) Outpatient Encounter Medications as of 07/10/2022  Medication Sig   aspirin 81 MG tablet Take 81 mg by mouth daily.   cholecalciferol (VITAMIN D) 1000 UNITS tablet Take 2,000 Units by mouth daily.    COVID-19 mRNA vaccine 2023-2024 (COMIRNATY) SUSP injection Inject into the muscle.   cyanocobalamin 1000 MCG tablet Take 1,000 mcg by mouth daily.   ezetimibe (ZETIA) 10 MG tablet TAKE 1 TABLET(10 MG) BY MOUTH DAILY   levothyroxine (SYNTHROID) 88 MCG tablet Take 1 tablet (88 mcg total) by mouth daily before breakfast.   lisinopril (ZESTRIL) 20 MG tablet Take 1 tablet (20 mg total) by mouth daily.   No facility-administered encounter medications on file as of 07/10/2022.    Allergies (verified) Atorvastatin,  Lobster [shellfish allergy], Penicillins, Strawberry extract, Erythromycin, Flexeril [cyclobenzaprine], and Omnipaque [iohexol]   History: Past Medical History:  Diagnosis Date   Arthritis    Endometrial polyp    Hypertension    Osteopenia 09/16/2012   T score -1.3 FRAX 7.6%/0.9%   Thyroid disease    hypothyroid   Past Surgical History:  Procedure Laterality Date   APPENDECTOMY     Bladder Bx     CARPAL TUNNEL RELEASE     X 2   CESAREAN SECTION     X 2   HERNIA REPAIR     TOE SURGERY     TUBAL LIGATION     Family History  Problem Relation Age of Onset   Alzheimer's disease Mother    Heart disease Father    Pancreatic cancer Father    Arthritis Father    Hyperlipidemia Father    Hypertension Father    Cancer Paternal Aunt        Ovarian or Uterine cancer   Aneurysm Paternal Uncle    Social History   Socioeconomic History   Marital status: Married    Spouse name: Not on file   Number of children: 2   Years of education: Not on file   Highest education level: Not on file  Occupational History   Occupation: retired  Tobacco Use   Smoking status: Every Day    Packs/day: 0.50    Types: Cigarettes   Smokeless  tobacco: Never  Vaping Use   Vaping Use: Never used  Substance and Sexual Activity   Alcohol use: Yes    Alcohol/week: 0.0 standard drinks of alcohol    Comment: rare   Drug use: No   Sexual activity: Never    Birth control/protection: Surgical  Other Topics Concern   Not on file  Social History Narrative   Lives with husband in a one story home.  Has 2 children.  Works part time as an Biochemist, clinical.  Education: some college.   Social Determinants of Health   Financial Resource Strain: Low Risk  (07/10/2022)   Overall Financial Resource Strain (CARDIA)    Difficulty of Paying Living Expenses: Not hard at all  Food Insecurity: No Food Insecurity (07/10/2022)   Hunger Vital Sign    Worried About Running Out of Food in the Last Year: Never true     Ran Out of Food in the Last Year: Never true  Transportation Needs: No Transportation Needs (07/10/2022)   PRAPARE - Hydrologist (Medical): No    Lack of Transportation (Non-Medical): No  Physical Activity: Insufficiently Active (07/10/2022)   Exercise Vital Sign    Days of Exercise per Week: 4 days    Minutes of Exercise per Session: 30 min  Stress: No Stress Concern Present (07/10/2022)   [place]    Feeling of Stress : Not at all  Social Connections: [place] (07/10/2022)   Social Connection and Isolation Panel [NHANES]    Frequency of Communication with Friends and Family: More than three times a week    Frequency of Social Gatherings with Friends and Family: More than three times a week    Attends Religious Services: More than 4 times per year    Active Member of [employer] or Organizations: Yes    Attends Music therapist: More than 4 times per year    Marital Status: Married    Tobacco Counseling Ready to quit: No Counseling given: No   Clinical Intake:  Pre-visit preparation completed: Yes  Pain : No/denies pain     Nutritional Risks: None Diabetes: No  How often do you need to have someone help you when you read instructions, pamphlets, or other written materials from your doctor or pharmacy?: 1 - Never  Diabetic?no   Interpreter Needed?: No  Information entered by :: Jadene Pierini, LPN   Activities of Daily Living    07/10/2022   10:37 AM 07/06/2022   10:26 AM  In your present state of health, do you have any difficulty performing the following activities:  Hearing? 0 0  Vision? 0 0  Difficulty concentrating or making decisions? 0 0  Walking or climbing stairs? 0 0  Dressing or bathing? 0 0  Doing errands, shopping? 0 0  Preparing Food and eating ? N N  Using the Toilet? N N  In the past six months, have you accidently leaked urine? N Y   Do you have problems with loss of bowel control? N N  Managing your Medications? N N  Managing your Finances? N N  Housekeeping or managing your Housekeeping? N N    Patient Care Team: Midge Minium, MD as PCP - General (Family Medicine) Suella Broad, MD as Consulting Physician (Physical Medicine and Rehabilitation) Philemon Kingdom, MD as Consulting Physician (Internal Medicine) Richmond Campbell, MD as Consulting Physician (Gastroenterology)  Indicate any recent Medical Services you may have received  from other than [facility] providers in the past year (date may be approximate).     Assessment:   This is a routine wellness examination for [place].  Hearing/Vision screen Vision Screening - Comments:: Annual eye exams wear glasses   Dietary issues and exercise activities discussed: Current Exercise Habits: Home exercise routine, Type of exercise: walking, Time (Minutes): 40, Frequency (Times/Week): 4, Weekly Exercise (Minutes/Week): 160, Intensity: Mild, Exercise limited by: None identified   Goals Addressed             This Visit's Progress    Patient Stated   On track    Lose weight by eating healthier       Depression Screen    07/10/2022   10:37 AM 05/26/2022    8:55 AM 04/08/2022   10:50 AM 10/30/2021    9:20 AM 09/27/2021    9:17 AM 08/27/2021    1:29 PM 06/26/2021   11:42 AM  PHQ 2/9 Scores  PHQ - 2 Score 0 0 0 0 0 0 0  PHQ- 9 Score 0 0 0 0 1 0     Fall Risk    07/10/2022   10:35 AM 07/06/2022   10:26 AM 05/26/2022    8:55 AM 04/08/2022   10:50 AM 10/30/2021    9:20 AM  Fall Risk   Falls in the past year? '1 1 1 1 1  '$ Number falls in past yr: '1 1 1 1 1  '$ Injury with Fall? 1 0 0 0 0  Risk for fall due to : History of fall(s);Impaired balance/gait;Orthopedic patient  History of fall(s) History of fall(s) History of fall(s)  Follow up Education provided;Falls prevention discussed  Falls evaluation completed Falls evaluation completed Falls evaluation completed     FALL RISK PREVENTION PERTAINING TO THE HOME:  Any stairs in or around the home? No  If so, are there any without handrails? No  Home free of loose throw rugs in walkways, pet beds, electrical cords, etc? Yes  Adequate lighting in your home to reduce risk of falls? Yes   ASSISTIVE DEVICES UTILIZED TO PREVENT FALLS:  Life alert? No  Use of a cane, walker or w/c? No  Grab bars in the bathroom? Yes  Shower chair or bench in shower? Yes  Elevated toilet seat or a handicapped toilet? Yes       07/06/2018    9:16 AM  MMSE - Mini Mental State Exam  Orientation to time 5  Orientation to Place 5  Registration 3  Attention/ Calculation 5  Recall 3  Language- name 2 objects 2  Language- repeat 1  Language- follow 3 step command 3  Language- read & follow direction 1  Write a sentence 1  Copy design 1  Total score 30        07/10/2022   10:38 AM  6CIT Screen  What Year? 0 points  What month? 0 points  What time? 0 points  Count back from 20 0 points  Months in reverse 0 points  Repeat phrase 0 points  Total Score 0 points    Immunizations Immunization History  Administered Date(s) Administered   COVID-19, mRNA, vaccine(Comirnaty)12 years and older 06/18/2022   Fluad Quad(high Dose 65+) 05/28/2020, 05/24/2021   Influenza Whole 06/10/2012, 06/14/2013   Influenza, High Dose Seasonal PF 05/26/2022   Influenza,inj,Quad PF,6+ Mos 06/05/2014, 05/11/2015   Influenza,inj,quad, With Preservative 05/19/2018   Influenza-Unspecified 05/23/2016, 05/15/2017, 05/19/2018, 04/29/2019   PFIZER Comirnaty(Gray Top)Covid-19 Tri-Sucrose Vaccine 01/07/2021   PFIZER(Purple Top)SARS-COV-2  Vaccination 10/14/2019, 11/09/2019, 07/06/2020, 06/28/2021   Pfizer Covid-19 Vaccine Bivalent Booster 45yr & up 06/28/2021   Pneumococcal Conjugate-13 04/28/2017   Pneumococcal Polysaccharide-23 07/06/2018   Tdap 09/08/2010, 07/23/2018   Zoster, Live 06/22/2013    TDAP status: Up to date  Flu  Vaccine status: Up to date  Pneumococcal vaccine status: Up to date  Covid-19 vaccine status: Completed vaccines  Qualifies for Shingles Vaccine? Yes   Zostavax completed Yes   Shingrix Completed?: Yes  Screening Tests Health Maintenance  Topic Date Due   COLONOSCOPY (Pts [phone] coverage will need to be confirmed)  02/04/2022   Hepatitis C Screening  04/09/2023 (Originally 11/13/1968)   DEXA SCAN  10/25/2022   MAMMOGRAM  11/01/2022   Medicare Annual Wellness (AWV)  07/11/2023   TETANUS/TDAP  07/23/2028   Pneumonia Vaccine 6550Years old  Completed   INFLUENZA VACCINE  Completed   HPV VACCINES  Aged Out   COVID-19 Vaccine  Discontinued   Zoster Vaccines- Shingrix  Discontinued    Health Maintenance  Health Maintenance Due  Topic Date Due   COLONOSCOPY (Pts [phone] coverage will need to be confirmed)  02/04/2022    Colorectal cancer screening: Referral to GI placed Schedule 07/24/2022. Pt aware the office will call re: appt.  Mammogram status: Completed 11/01/2021. Repeat every year  Bone Density status: Completed 10/25/2020. Results reflect: Bone density results: OSTEOPENIA. Repeat every 5 years.  Lung Cancer Screening: (Low Dose CT Chest recommended if Age 75-21-80ars, 30 pack-year currently smoking OR have quit w/in 15years.) does not qualify.   Lung Cancer Screening Referral: n/a  Additional Screening:  Hepatitis C Screening: does not qualify;   Vision Screening: Recommended annual ophthalmology exams for early detection of glaucoma and other disorders of the eye. Is the patient up to date with their annual eye exam?  Yes  Who is the provider or what is the name of the office in which the patient attends annual eye exams? Dr.Groat  If pt is not established with a provider, would they like to be referred to a provider to establish care? No .   Dental Screening: Recommended annual dental exams for proper oral hygiene  Community Resource  Referral / Chronic Care Management: CRR required this visit?  No   CCM required this visit?  No      Plan:     I have personally reviewed and noted the following in the patient's chart:   Medical and social history Use of alcohol, tobacco or illicit drugs  Current medications and supplements including opioid prescriptions. Patient is not currently taking opioid prescriptions. Functional ability and status Nutritional status Physical activity Advanced directives List of other physicians Hospitalizations, surgeries, and ER visits in previous 12 months Vitals Screenings to include cognitive, depression, and falls Referrals and appointments  In addition, I have reviewed and discussed with patient certain preventive protocols, quality metrics, and best practice recommendations. A written personalized care plan for preventive services as well as general preventive health recommendations were provided to patient.     LauDaphane ShepherdPN   11/02/5/8527Nurse Notes: none

## 2022-07-10 NOTE — Patient Instructions (Signed)
Sylvia Stevens , Thank you for taking time to come for your Medicare Wellness Visit. I appreciate your ongoing commitment to your health goals. Please review the following plan we discussed and let me know if I can assist you in the future.   These are the goals we discussed:  Goals      Patient Stated     Lose weight by eating healthier     Patient Stated     Loose weight        This is a list of the screening recommended for you and due dates:  Health Maintenance  Topic Date Due   Colon Cancer Screening  02/04/2022   Hepatitis C Screening: USPSTF Recommendation to screen - Ages 18-79 yo.  04/09/2023*   DEXA scan (bone density measurement)  10/25/2022   Mammogram  11/01/2022   Medicare Annual Wellness Visit  07/11/2023   Tetanus Vaccine  07/23/2028   Pneumonia Vaccine  Completed   Flu Shot  Completed   HPV Vaccine  Aged Out   COVID-19 Vaccine  Discontinued   Zoster (Shingles) Vaccine  Discontinued  *Topic was postponed. The date shown is not the original due date.    Advanced directives: Please bring a copy of your health care power of attorney and living will to the office to be added to your chart at your convenience.   Conditions/risks identified: Aim for 30 minutes of exercise or brisk walking, 6-8 glasses of water, and 5 servings of fruits and vegetables each day.   Next appointment: Follow up in one year for your annual wellness visit    Preventive Care 65 Years and Older, Female Preventive care refers to lifestyle choices and visits with your health care provider that can promote health and wellness. What does preventive care include? A yearly physical exam. This is also called an annual well check. Dental exams once or twice a year. Routine eye exams. Ask your health care provider how often you should have your eyes checked. Personal lifestyle choices, including: Daily care of your teeth and gums. Regular physical activity. Eating a healthy diet. Avoiding tobacco  and drug use. Limiting alcohol use. Practicing safe sex. Taking low-dose aspirin every day. Taking vitamin and mineral supplements as recommended by your health care provider. What happens during an annual well check? The services and screenings done by your health care provider during your annual well check will depend on your age, overall health, lifestyle risk factors, and family history of disease. Counseling  Your health care provider may ask you questions about your: Alcohol use. Tobacco use. Drug use. Emotional well-being. Home and relationship well-being. Sexual activity. Eating habits. History of falls. Memory and ability to understand (cognition). Work and work Statistician. Reproductive health. Screening  You may have the following tests or measurements: Height, weight, and BMI. Blood pressure. Lipid and cholesterol levels. These may be checked every 5 years, or more frequently if you are over 2 years old. Skin check. Lung cancer screening. You may have this screening every year starting at age 72 if you have a 30-pack-year history of smoking and currently smoke or have quit within the past 15 years. Fecal occult blood test (FOBT) of the stool. You may have this test every year starting at age 85. Flexible sigmoidoscopy or colonoscopy. You may have a sigmoidoscopy every 5 years or a colonoscopy every 10 years starting at age 44. Hepatitis C blood test. Hepatitis B blood test. Sexually transmitted disease (STD) testing. Diabetes screening. This is  done by checking your blood sugar (glucose) after you have not eaten for a while (fasting). You may have this done every 1-3 years. Bone density scan. This is done to screen for osteoporosis. You may have this done starting at age 40. Mammogram. This may be done every 1-2 years. Talk to your health care provider about how often you should have regular mammograms. Talk with your health care provider about your test results,  treatment options, and if necessary, the need for more tests. Vaccines  Your health care provider may recommend certain vaccines, such as: Influenza vaccine. This is recommended every year. Tetanus, diphtheria, and acellular pertussis (Tdap, Td) vaccine. You may need a Td booster every 10 years. Zoster vaccine. You may need this after age 52. Pneumococcal 13-valent conjugate (PCV13) vaccine. One dose is recommended after age 13. Pneumococcal polysaccharide (PPSV23) vaccine. One dose is recommended after age 72. Talk to your health care provider about which screenings and vaccines you need and how often you need them. This information is not intended to replace advice given to you by your health care provider. Make sure you discuss any questions you have with your health care provider. Document Released: 09/21/2015 Document Revised: 05/14/2016 Document Reviewed: 06/26/2015 Elsevier Interactive Patient Education  2017 [place] Prevention in the Home Falls can cause injuries. They can happen to people of all ages. There are many things you can do to make your home safe and to help prevent falls. What can I do on the outside of my home? Regularly fix the edges of walkways and driveways and fix any cracks. Remove anything that might make you trip as you walk through a door, such as a raised step or threshold. Trim any bushes or trees on the path to your home. Use bright outdoor lighting. Clear any walking paths of anything that might make someone trip, such as rocks or tools. Regularly check to see if handrails are loose or broken. Make sure that both sides of any steps have handrails. Any raised decks and porches should have guardrails on the edges. Have any leaves, snow, or ice cleared regularly. Use sand or salt on walking paths during winter. Clean up any spills in your garage right away. This includes oil or grease spills. What can I do in the bathroom? Use night  lights. Install grab bars by the toilet and in the tub and shower. Do not use towel bars as grab bars. Use non-skid mats or decals in the tub or shower. If you need to sit down in the shower, use a plastic, non-slip stool. Keep the floor dry. Clean up any water that spills on the floor as soon as it happens. Remove soap buildup in the tub or shower regularly. Attach bath mats securely with double-sided non-slip rug tape. Do not have throw rugs and other things on the floor that can make you trip. What can I do in the bedroom? Use night lights. Make sure that you have a light by your bed that is easy to reach. Do not use any sheets or blankets that are too big for your bed. They should not hang down onto the floor. Have a firm chair that has side arms. You can use this for support while you get dressed. Do not have throw rugs and other things on the floor that can make you trip. What can I do in the kitchen? Clean up any spills right away. Avoid walking on wet floors. Keep items that you  use a lot in easy-to-reach places. If you need to reach something above you, use a strong step stool that has a grab bar. Keep electrical cords out of the way. Do not use floor polish or wax that makes floors slippery. If you must use wax, use non-skid floor wax. Do not have throw rugs and other things on the floor that can make you trip. What can I do with my stairs? Do not leave any items on the stairs. Make sure that there are handrails on both sides of the stairs and use them. Fix handrails that are broken or loose. Make sure that handrails are as long as the stairways. Check any carpeting to make sure that it is firmly attached to the stairs. Fix any carpet that is loose or worn. Avoid having throw rugs at the top or bottom of the stairs. If you do have throw rugs, attach them to the floor with carpet tape. Make sure that you have a light switch at the top of the stairs and the bottom of the stairs. If  you do not have them, ask someone to add them for you. What else can I do to help prevent falls? Wear shoes that: Do not have high heels. Have rubber bottoms. Are comfortable and fit you well. Are closed at the toe. Do not wear sandals. If you use a stepladder: Make sure that it is fully opened. Do not climb a closed stepladder. Make sure that both sides of the stepladder are locked into place. Ask someone to hold it for you, if possible. Clearly mark and make sure that you can see: Any grab bars or handrails. First and last steps. Where the edge of each step is. Use tools that help you move around (mobility aids) if they are needed. These include: Canes. Walkers. Scooters. Crutches. Turn on the lights when you go into a dark area. Replace any light bulbs as soon as they burn out. Set up your furniture so you have a clear path. Avoid moving your furniture around. If any of your floors are uneven, fix them. If there are any pets around you, be aware of where they are. Review your medicines with your doctor. Some medicines can make you feel dizzy. This can increase your chance of falling. Ask your doctor what other things that you can do to help prevent falls. This information is not intended to replace advice given to you by your health care provider. Make sure you discuss any questions you have with your health care provider. Document Released: 06/21/2009 Document Revised: 01/31/2016 Document Reviewed: 09/29/2014 Elsevier Interactive Patient Education  2017 [employer].

## 2022-07-17 ENCOUNTER — Encounter: Payer: Self-pay | Admitting: Gastroenterology

## 2022-07-24 ENCOUNTER — Ambulatory Visit (AMBULATORY_SURGERY_CENTER): Payer: PPO | Admitting: Gastroenterology

## 2022-07-24 ENCOUNTER — Encounter: Payer: Self-pay | Admitting: Gastroenterology

## 2022-07-24 VITALS — BP 96/54 | HR 92 | Temp 97.1°F | Resp 14 | Ht 62.0 in | Wt 140.0 lb

## 2022-07-24 DIAGNOSIS — Z86010 Personal history of colonic polyps: Secondary | ICD-10-CM | POA: Diagnosis not present

## 2022-07-24 DIAGNOSIS — Z09 Encounter for follow-up examination after completed treatment for conditions other than malignant neoplasm: Secondary | ICD-10-CM

## 2022-07-24 DIAGNOSIS — K921 Melena: Secondary | ICD-10-CM

## 2022-07-24 DIAGNOSIS — I10 Essential (primary) hypertension: Secondary | ICD-10-CM | POA: Diagnosis not present

## 2022-07-24 DIAGNOSIS — D120 Benign neoplasm of cecum: Secondary | ICD-10-CM | POA: Diagnosis not present

## 2022-07-24 DIAGNOSIS — E039 Hypothyroidism, unspecified: Secondary | ICD-10-CM | POA: Diagnosis not present

## 2022-07-24 MED ORDER — SODIUM CHLORIDE 0.9 % IV SOLN: 500.0000 mL | Freq: Once | INTRAVENOUS | Status: DC

## 2022-07-24 NOTE — Progress Notes (Signed)
1230 Ephedrine 10 mg given IV due to low BP, MD updated.

## 2022-07-24 NOTE — Progress Notes (Signed)
Report received for lunch relief. vss

## 2022-07-24 NOTE — Progress Notes (Signed)
1220 - BP cuff moved from pt's leg to right upper arm.

## 2022-07-24 NOTE — Progress Notes (Signed)
Pt's states no medical or surgical changes since previsit or office visit. 

## 2022-07-24 NOTE — Progress Notes (Signed)
History and Physical Interval Note:  07/24/2022 11:56 AM  Sylvia Stevens  has presented today for endoscopic procedure(s), with the diagnosis of  Encounter Diagnosis  Name Primary?   Hematochezia Yes  History of colon polyps (primary diagnosis)   The various methods of evaluation and treatment have been discussed with the patient and/or family. After consideration of risks, benefits and other options for treatment, the patient has consented to  the endoscopic procedure(s).   The patient's history has been reviewed, patient examined, no change in status, stable for endoscopic procedure(s).  I have reviewed the patient's chart and labs.  Questions were answered to the patient's satisfaction.     Jarrell Armond E. Candis Schatz, MD [facility]

## 2022-07-24 NOTE — Progress Notes (Signed)
Called to room to assist during endoscopic procedure.  Patient ID and intended procedure confirmed with present staff. Received instructions for my participation in the procedure from the performing physician.  

## 2022-07-24 NOTE — Progress Notes (Signed)
Report given to PACU, vss 

## 2022-07-24 NOTE — Patient Instructions (Signed)
Information on polyps and diverticulosis given to you.  Await pathology results.  Resume previous diet and medications.  Given patient's age and lack of high risk polyps, recommend against further colon cancer screening.   YOU HAD AN ENDOSCOPIC PROCEDURE TODAY AT [facility]:   Refer to the procedure report that was given to you for any specific questions about what was found during the examination.  If the procedure report does not answer your questions, please call your gastroenterologist to clarify.  If you requested that your care partner not be given the details of your procedure findings, then the procedure report has been included in a sealed envelope for you to review at your convenience later.  YOU SHOULD EXPECT: Some feelings of bloating in the abdomen. Passage of more gas than usual.  Walking can help get rid of the air that was put into your GI tract during the procedure and reduce the bloating. If you had a lower endoscopy (such as a colonoscopy or flexible sigmoidoscopy) you may notice spotting of blood in your stool or on the toilet paper. If you underwent a bowel prep for your procedure, you may not have a normal bowel movement for a few days.  Please Note:  You might notice some irritation and congestion in your nose or some drainage.  This is from the oxygen used during your procedure.  There is no need for concern and it should clear up in a day or so.  SYMPTOMS TO REPORT IMMEDIATELY:  Following lower endoscopy (colonoscopy or flexible sigmoidoscopy):  Excessive amounts of blood in the stool  Significant tenderness or worsening of abdominal pains  Swelling of the abdomen that is new, acute  Fever of 100F or higher   For urgent or emergent issues, a gastroenterologist can be reached at any hour by calling [phone]. Do not use MyChart messaging for urgent concerns.    DIET:  We do recommend a small meal at first, but then you may proceed to your  regular diet.  Drink plenty of fluids but you should avoid alcoholic beverages for 24 hours.  ACTIVITY:  You should plan to take it easy for the rest of today and you should NOT DRIVE or use heavy machinery until tomorrow (because of the sedation medicines used during the test).    FOLLOW UP: Our staff will call the number listed on your records the next business day following your procedure.  We will call around 7:15- 8:00 am to check on you and address any questions or concerns that you may have regarding the information given to you following your procedure. If we do not reach you, we will leave a message.     If any biopsies were taken you will be contacted by phone or by letter within the next 1-3 weeks.  Please call us at [phone] if you have not heard about the biopsies in 3 weeks.    SIGNATURES/CONFIDENTIALITY: You and/or your care partner have signed paperwork which will be entered into your electronic medical record.  These signatures attest to the fact that that the information above on your After Visit Summary has been reviewed and is understood.  Full responsibility of the confidentiality of this discharge information lies with you and/or your care-partner.

## 2022-07-24 NOTE — Op Note (Signed)
[facility] [facility] Patient Name: Sylvia Stevens Procedure Date: 07/24/2022 12:07 PM MRN: [id number] Endoscopist: Nicki Reaper E. Candis Schatz , MD, [id number] Age: 71 Referring MD:  Date of Birth: [birth year] Gender: Female Account #: [id number] Procedure:                Colonoscopy Indications:              Surveillance: Personal history of adenomatous                            polyps on last colonoscopy > 5 years ago Medicines:                Monitored Anesthesia Care, 10 mg ephedran for                            hypotension Procedure:                Pre-Anesthesia Assessment:                           - Prior to the procedure, a History and Physical                            was performed, and patient medications and                            allergies were reviewed. The patient's tolerance of                            previous anesthesia was also reviewed. The risks                            and benefits of the procedure and the sedation                            options and risks were discussed with the patient.                            All questions were answered, and informed consent                            was obtained. Prior Anticoagulants: The patient has                            taken no anticoagulant or antiplatelet agents                            except for aspirin. ASA Grade Assessment: II - A                            patient with mild systemic disease. After reviewing                            the risks and benefits, the patient was deemed in  satisfactory condition to undergo the procedure.                           After obtaining informed consent, the colonoscope                            was passed under direct vision. Throughout the                            procedure, the patient's blood pressure, pulse, and                            oxygen saturations were monitored continuously. The                            CF [id number]  #[id number] was introduced through the anus                            and advanced to the the cecum, identified by                            appendiceal orifice and ileocecal valve. The                            colonoscopy was performed with moderate difficulty                            due to a redundant colon and significant looping.                            Successful completion of the procedure was aided by                            changing the patient to a prone position, using                            manual pressure, withdrawing and reinserting the                            scope and straightening and shortening the scope to                            obtain bowel loop reduction. The patient tolerated                            the procedure fairly well but did experience                            transient hypotension treated with ephedrine. The                            quality of the bowel preparation was excellent. The  ileocecal valve, appendiceal orifice, and rectum                            were photographed. Scope In: 12:11:31 PM Scope Out: 12:39:51 PM Scope Withdrawal Time: 0 hours 8 minutes 16 seconds  Total Procedure Duration: 0 hours 28 minutes 20 seconds  Findings:                 Skin tags were found on perianal exam.                           The digital rectal exam was normal. Pertinent                            negatives include normal sphincter tone and no                            palpable rectal lesions.                           A 3 mm polyp was found in the cecum. The polyp was                            sessile. The polyp was removed with a cold snare.                            Resection and retrieval were complete. Estimated                            blood loss was minimal.                           A few small-mouthed diverticula were found in the                            sigmoid colon.                            The exam was otherwise normal throughout the                            examined colon.                           The retroflexed view of the distal rectum and anal                            verge was normal and showed no anal or rectal                            abnormalities. Complications:            No immediate complications. Estimated Blood Loss:     Estimated blood loss was minimal. Impression:               - Perianal skin tags found on perianal exam.                           -  One 3 mm polyp in the cecum, removed with a cold                            snare. Resected and retrieved.                           - Diverticulosis in the sigmoid colon.                           - The distal rectum and anal verge are normal on                            retroflexion view. Recommendation:           - Patient has a contact number available for                            emergencies. The signs and symptoms of potential                            delayed complications were discussed with the                            patient. Return to normal activities tomorrow.                            Written discharge instructions were provided to the                            patient.                           - Resume previous diet.                           - Continue present medications.                           - Await pathology results.                           - Given patient's age and lack of high risk polyps,                            I recommend against further colon cancer screening. Sylvia Stevens E. Candis Schatz, MD 07/24/2022 12:46:11 PM This report has been signed electronically.

## 2022-07-25 ENCOUNTER — Telehealth: Payer: Self-pay

## 2022-07-25 NOTE — Telephone Encounter (Signed)
  Follow up Call-     07/24/2022   11:05 AM  Call back number  Post procedure Call Back phone  # [phone]  Permission to leave phone message Yes     Patient questions:  Do you have a fever, pain , or abdominal swelling? No. Pain Score  0 *  Have you tolerated food without any problems? Yes.    Have you been able to return to your normal activities? Yes.    Do you have any questions about your discharge instructions: Diet   No. Medications  No. Follow up visit  No.  Do you have questions or concerns about your Care? No.  Actions: * If pain score is 4 or above: No action needed, pain <4.

## 2022-08-03 ENCOUNTER — Other Ambulatory Visit: Payer: Self-pay | Admitting: Family Medicine

## 2022-08-03 DIAGNOSIS — E785 Hyperlipidemia, unspecified: Secondary | ICD-10-CM

## 2022-09-30 ENCOUNTER — Other Ambulatory Visit: Payer: Self-pay

## 2022-09-30 DIAGNOSIS — I10 Essential (primary) hypertension: Secondary | ICD-10-CM

## 2022-09-30 MED ORDER — LISINOPRIL 20 MG PO TABS: 20.0000 mg | ORAL_TABLET | Freq: Every day | ORAL | 3 refills | Status: DC

## 2022-10-19 ENCOUNTER — Encounter: Payer: Self-pay | Admitting: Family Medicine

## 2022-10-20 NOTE — Telephone Encounter (Signed)
Called and offered patient a visit she declined and I did suggest a virtual patient stated unable to do the video portion so she declined a visit at this time and will call back if she worsens

## 2022-10-29 ENCOUNTER — Other Ambulatory Visit: Payer: Self-pay | Admitting: Family Medicine

## 2022-10-29 DIAGNOSIS — E785 Hyperlipidemia, unspecified: Secondary | ICD-10-CM

## 2022-11-03 DIAGNOSIS — Z1231 Encounter for screening mammogram for malignant neoplasm of breast: Secondary | ICD-10-CM | POA: Diagnosis not present

## 2022-11-03 LAB — HM MAMMOGRAPHY

## 2023-01-08 ENCOUNTER — Other Ambulatory Visit: Payer: Self-pay | Admitting: Family Medicine

## 2023-01-08 DIAGNOSIS — I10 Essential (primary) hypertension: Secondary | ICD-10-CM

## 2023-01-20 ENCOUNTER — Other Ambulatory Visit: Payer: Self-pay | Admitting: Internal Medicine

## 2023-01-26 ENCOUNTER — Encounter: Payer: Self-pay | Admitting: Internal Medicine

## 2023-01-26 ENCOUNTER — Ambulatory Visit: Payer: PPO | Admitting: Internal Medicine

## 2023-01-26 VITALS — BP 110/74 | HR 77 | Ht 62.0 in | Wt 141.0 lb

## 2023-01-26 DIAGNOSIS — E538 Deficiency of other specified B group vitamins: Secondary | ICD-10-CM | POA: Diagnosis not present

## 2023-01-26 DIAGNOSIS — E559 Vitamin D deficiency, unspecified: Secondary | ICD-10-CM | POA: Diagnosis not present

## 2023-01-26 DIAGNOSIS — E039 Hypothyroidism, unspecified: Secondary | ICD-10-CM | POA: Diagnosis not present

## 2023-01-26 NOTE — Patient Instructions (Addendum)
Please come back for labs.  Continue Levothyroxine 88 mcg daily.  Take the thyroid hormone every day, with water, at least 30 minutes before breakfast, separated by at least 4 hours from: - acid reflux medications - calcium - iron - multivitamins  Please continue: - Vitamin D 2000 units daily - Vitamin B12 1000 mcg every other day  Please return in 1 year.

## 2023-01-26 NOTE — Progress Notes (Signed)
Subjective:     Patient ID: Monna Fam, female   DOB: 11-24-1950, 72 y.o.   MRN: [id number]  HPI Ms Goleman is a 72 y.o.woman, recently for f/u for well controlled hypothyroidism, dx 2008, and also vitamin B12 and D deficiencies. Last visit was 1 year ago.  Interim history: She denies any sxs today other than bilateral knee pain.  She is undecided about a TKR. She started to lose her voice 2 weeks ago - mostly when singing. No hoarseness.   She has well-controlled hypothyroidism, on stable dose of levothyroxine.  Pt is on levothyroxine 88 mcg daily, taken: - in am - fasting - at least 30 min from b'fast - no Ca, Fe, MVI, + occasionally PPIs later in the day (very seldom) - not on Biotin - takes vitamin D, B12, fish oil  Reviewed TFTs: Lab Results  Component Value Date   TSH 0.49 05/26/2022   TSH 1.71 01/24/2022   TSH 3.08 08/27/2021   TSH 3.73 01/18/2021   TSH 1.18 04/23/2020   TSH 1.20 01/19/2020   TSH 1.18 04/22/2019   TSH 3.14 01/13/2018   TSH 1.56 04/28/2017   TSH 2.53 12/25/2016   FREET4 1.21 01/24/2022   FREET4 1.03 01/18/2021   FREET4 1.36 01/19/2020   FREET4 1.07 01/13/2018   FREET4 1.15 12/25/2016   FREET4 1.13 12/26/2015   FREET4 0.98 12/22/2014   FREET4 1.06 12/01/2013   FREET4 1.11 06/03/2013   FREET4 1.11 02/04/2013   No FH of thyroid ds. No FH of thyroid cancer. No h/o radiation tx to head or neck. No herbal supplements. No Biotin use. No recent steroids use.   She also has a history of HTN, osteopenia, osteoarthritis, bursitis.   Low B12: We checked her vitamin levels due to her complaints of fatigue.  A B12 level was found to be low in the normal range so we started her on supplementation.  At last visit, she was taking 2500 mcg daily, so we decreased the dose to 1000 mcg daily and then 1000 mcg every other day.    On this dose, latest vitamin B-12 was normal: Lab Results  Component Value Date   VITAMINB12 805 05/26/2022   VITAMINB12 >1504 (H)  01/24/2022   VITAMINB12 1,002 (H) 01/18/2021   VITAMINB12 >1526 (H) 04/23/2020   VITAMINB12 1,308 (H) 01/19/2020   VITAMINB12 >1500 (H) 04/22/2019   VITAMINB12 >1500 (H) 01/13/2018   VITAMINB12 >1500 (H) 04/28/2017   VITAMINB12 214 12/25/2016   Vitamin D insufficiency: She takes 2000 units vitamin D daily, dose doubled in 01/2020.    Latest vitamin D level was normal: Lab Results  Component Value Date   VD25OH 50.22 05/26/2022   VD25OH 55.24 01/24/2022   VD25OH 41.25 01/18/2021   VD25OH 44.24 04/23/2020   VD25OH 22.4 (L) 01/19/2020   VD25OH 38.77 04/22/2019   VD25OH 41.98 01/13/2018   VD25OH 45.14 04/28/2017   VD25OH 37.63 04/25/2016   VD25OH 45.45 03/29/2015    She is also taking fish oil - occasionally.  Her husband has breast cancer. He had RxTx. He also has DM.   Review of Systems + see HPI  I reviewed pt's medications, allergies, PMH, social hx, family hx, and changes were documented in the history of present illness. Otherwise, unchanged from my initial visit note.  Past Medical History:  Diagnosis Date   Arthritis    Endometrial polyp    Hypertension    Osteopenia 09/16/2012   T score -1.3 FRAX 7.6%/0.9%   Thyroid disease  hypothyroid   Past Surgical History:  Procedure Laterality Date   APPENDECTOMY     Bladder Bx     CARPAL TUNNEL RELEASE     X 2   CESAREAN SECTION     X 2   HERNIA REPAIR     TOE SURGERY     TUBAL LIGATION     Social History   Socioeconomic History   Marital status: Married    Spouse name: Not on file   Number of children: 2   Years of education: Not on file   Highest education level: Not on file  Occupational History   Occupation: retired  Tobacco Use   Smoking status: Every Day    Packs/day: .5    Types: Cigarettes   Smokeless tobacco: Never  Vaping Use   Vaping Use: Never used  Substance and Sexual Activity   Alcohol use: Yes    Alcohol/week: 0.0 standard drinks of alcohol    Comment: rare   Drug use: No    Sexual activity: Never    Birth control/protection: Surgical  Other Topics Concern   Not on file  Social History Narrative   Lives with husband in a one story home.  Has 2 children.  Works part time as an Gaffer.  Education: some college.   Social Determinants of Health   Financial Resource Strain: Low Risk  (07/10/2022)   Overall Financial Resource Strain (CARDIA)    Difficulty of Paying Living Expenses: Not hard at all  Food Insecurity: No Food Insecurity (07/10/2022)   Hunger Vital Sign    Worried About Running Out of Food in the Last Year: Never true    Ran Out of Food in the Last Year: Never true  Transportation Needs: No Transportation Needs (07/10/2022)   PRAPARE - Administrator, Civil Service (Medical): No    Lack of Transportation (Non-Medical): No  Physical Activity: Insufficiently Active (07/10/2022)   Exercise Vital Sign    Days of Exercise per Week: 4 days    Minutes of Exercise per Session: 30 min  Stress: No Stress Concern Present (07/10/2022)   [employer] of Occupational Health - Occupational Stress Questionnaire    Feeling of Stress : Not at all  Social Connections: Socially Integrated (07/10/2022)   Social Connection and Isolation Panel [NHANES]    Frequency of Communication with Friends and Family: More than three times a week    Frequency of Social Gatherings with Friends and Family: More than three times a week    Attends Religious Services: More than 4 times per year    Active Member of [employer] or Organizations: Yes    Attends Engineer, structural: More than 4 times per year    Marital Status: Married  Catering manager Violence: Not At Risk (07/10/2022)   Humiliation, Afraid, Rape, and Kick questionnaire    Fear of Current or Ex-Partner: No    Emotionally Abused: No    Physically Abused: No    Sexually Abused: No   Current Outpatient Medications on File Prior to Visit  Medication Sig Dispense Refill   aspirin 81 MG  tablet Take 81 mg by mouth daily.     cholecalciferol (VITAMIN D) 1000 UNITS tablet Take 2,000 Units by mouth daily.      COVID-19 mRNA vaccine 2023-2024 (COMIRNATY) SUSP injection Inject into the muscle. 0.3 mL 0   cyanocobalamin 1000 MCG tablet Take 1,000 mcg by mouth daily.     ezetimibe (ZETIA) 10 MG tablet  TAKE 1 TABLET(10 MG) BY MOUTH DAILY 90 tablet 0   levothyroxine (SYNTHROID) 88 MCG tablet TAKE 1 TABLET(88 MCG) BY MOUTH DAILY BEFORE BREAKFAST 90 tablet 0   lisinopril (ZESTRIL) 20 MG tablet TAKE 1 TABLET(20 MG) BY MOUTH DAILY 30 tablet 3   No current facility-administered medications on file prior to visit.   Allergies  Allergen Reactions   Atorvastatin Hives   Lobster [Shellfish Allergy] Hives   Penicillins Hives   Strawberry Extract Hives   Erythromycin Other (See Comments)    Face feels like it is sunburned.    Flexeril [Cyclobenzaprine]     Vomit    Omnipaque [Iohexol]    Family History  Problem Relation Age of Onset   Alzheimer's disease Mother    Heart disease Father    Pancreatic cancer Father    Arthritis Father    Hyperlipidemia Father    Hypertension Father    Cancer Paternal Aunt        Ovarian or Uterine cancer   Aneurysm Paternal Uncle    Colon cancer Neg Hx    Rectal cancer Neg Hx    Stomach cancer Neg Hx    Esophageal cancer Neg Hx     Objective:   Physical Exam BP 110/74 (BP Location: Right Arm, Patient Position: Sitting, Cuff Size: Normal)   Pulse 77   Ht 5\' 2"  (1.575 m)   Wt 141 lb (64 kg)   SpO2 98%   BMI 25.79 kg/m   Wt Readings from Last 3 Encounters:  01/26/23 141 lb (64 kg)  07/24/22 140 lb (63.5 kg)  07/10/22 134 lb (60.8 kg)   Constitutional: normal weight, in NAD Eyes:  EOMI, no exophthalmos ENT: no neck masses, no cervical lymphadenopathy Cardiovascular: RRR, No MRG Respiratory: CTA B Musculoskeletal: no deformities Skin:no rashes Neurological: no tremor with outstretched hands  Assessment:     1. Hypothyroidism  -  on  stable  dose of levothyroxine generic since diagnosis  2.  Low vitamin B12  3.  Vitamin D deficiency     Plan:     1. Patient with long history of controlled hypothyroidism, on generic levothyroxine - latest thyroid labs reviewed with pt. >> normal: Lab Results  Component Value Date   TSH 0.49 05/26/2022  - she continues on LT4 88 mcg daily - pt feels good on this dose, but her voice is giving out when singing.  She describes that this happened when she was initially diagnosed with hypothyroidism. - we discussed about taking the thyroid hormone every day, with water, >30 minutes before breakfast, separated by >4 hours from acid reflux medications, calcium, iron, multivitamins. Pt. is taking it correctly. - will check thyroid tests at the next lab draw-no labs tech today- she will return for labs: TSH and fT4 - If labs are abnormal, she will need to return for repeat TFTs in 1.5 months  2.  Low vitamin B12 -At last visit I suggested to switch from taking the B12 supplement 1000 mcg daily to every other day as the B12 level was undetectably high -She continues on 1000 mcg every other day now daily -Will have the level rechecked by PCP in 05/2022  3.  Vitamin D deficiency  -She continues on 2000 units daily -Vitamin D level was normal 05/2022, at 50.22 on the above dose -Will have the level rechecked by PCP in 05/2022  Needs refills.  Carlus Pavlov, MD PhD [facility]

## 2023-01-28 ENCOUNTER — Other Ambulatory Visit (INDEPENDENT_AMBULATORY_CARE_PROVIDER_SITE_OTHER): Payer: PPO

## 2023-01-28 DIAGNOSIS — E039 Hypothyroidism, unspecified: Secondary | ICD-10-CM

## 2023-01-28 LAB — TSH: TSH: 0.53 u[IU]/mL (ref 0.35–5.50)

## 2023-01-28 LAB — T4, FREE: Free T4: 1.33 ng/dL (ref 0.60–1.60)

## 2023-01-29 MED ORDER — LEVOTHYROXINE SODIUM 88 MCG PO TABS: 88.0000 ug | ORAL_TABLET | Freq: Every day | ORAL | 3 refills | Status: DC

## 2023-01-29 NOTE — Addendum Note (Signed)
Addended by: Carlus Pavlov on: 01/29/2023 12:36 PM   Modules accepted: Orders

## 2023-02-11 ENCOUNTER — Other Ambulatory Visit: Payer: Self-pay | Admitting: Family Medicine

## 2023-02-11 DIAGNOSIS — E785 Hyperlipidemia, unspecified: Secondary | ICD-10-CM

## 2023-04-23 ENCOUNTER — Encounter (INDEPENDENT_AMBULATORY_CARE_PROVIDER_SITE_OTHER): Payer: Self-pay

## 2023-04-24 ENCOUNTER — Other Ambulatory Visit: Payer: Self-pay | Admitting: Oncology

## 2023-04-24 DIAGNOSIS — Z006 Encounter for examination for normal comparison and control in clinical research program: Secondary | ICD-10-CM

## 2023-04-25 ENCOUNTER — Other Ambulatory Visit: Payer: Self-pay | Admitting: Family Medicine

## 2023-04-25 DIAGNOSIS — E785 Hyperlipidemia, unspecified: Secondary | ICD-10-CM

## 2023-04-25 DIAGNOSIS — I10 Essential (primary) hypertension: Secondary | ICD-10-CM

## 2023-04-29 ENCOUNTER — Ambulatory Visit (INDEPENDENT_AMBULATORY_CARE_PROVIDER_SITE_OTHER): Payer: PPO | Admitting: *Deleted

## 2023-04-29 DIAGNOSIS — Z0000 Encounter for general adult medical examination without abnormal findings: Secondary | ICD-10-CM | POA: Diagnosis not present

## 2023-04-29 DIAGNOSIS — Z13820 Encounter for screening for osteoporosis: Secondary | ICD-10-CM | POA: Diagnosis not present

## 2023-04-29 NOTE — Patient Instructions (Signed)
[name] , Thank you for taking time to come for your Medicare Wellness Visit. I appreciate your ongoing commitment to your health goals. Please review the following plan we discussed and let me know if I can assist you in the future.   Screening recommendations/referrals: Colonoscopy: no longer required Mammogram: up to date Bone Density: ordered Recommended yearly ophthalmology/optometry visit for glaucoma screening and checkup Recommended yearly dental visit for hygiene and checkup  Vaccinations: Influenza vaccine: up to date Pneumococcal vaccine: up to date Tdap vaccine: up to date     Advanced directives: yes not on file     Preventive Care 65 Years and Older, Female Preventive care refers to lifestyle choices and visits with your health care provider that can promote health and wellness. What does preventive care include? A yearly physical exam. This is also called an annual well check. Dental exams once or twice a year. Routine eye exams. Ask your health care provider how often you should have your eyes checked. Personal lifestyle choices, including: Daily care of your teeth and gums. Regular physical activity. Eating a healthy diet. Avoiding tobacco and drug use. Limiting alcohol use. Practicing safe sex. Taking low-dose aspirin every day. Taking vitamin and mineral supplements as recommended by your health care provider. What happens during an annual well check? The services and screenings done by your health care provider during your annual well check will depend on your age, overall health, lifestyle risk factors, and family history of disease. Counseling  Your health care provider may ask you questions about your: Alcohol use. Tobacco use. Drug use. Emotional well-being. Home and relationship well-being. Sexual activity. Eating habits. History of falls. Memory and ability to understand (cognition). Work and work Astronomer. Reproductive  health. Screening  You may have the following tests or measurements: Height, weight, and BMI. Blood pressure. Lipid and cholesterol levels. These may be checked every 5 years, or more frequently if you are over 55 years old. Skin check. Lung cancer screening. You may have this screening every year starting at age 31 if you have a 30-pack-year history of smoking and currently smoke or have quit within the past 15 years. Fecal occult blood test (FOBT) of the stool. You may have this test every year starting at age 15. Flexible sigmoidoscopy or colonoscopy. You may have a sigmoidoscopy every 5 years or a colonoscopy every 10 years starting at age 45. Hepatitis C blood test. Hepatitis B blood test. Sexually transmitted disease (STD) testing. Diabetes screening. This is done by checking your blood sugar (glucose) after you have not eaten for a while (fasting). You may have this done every 1-3 years. Bone density scan. This is done to screen for osteoporosis. You may have this done starting at age 72. Mammogram. This may be done every 1-2 years. Talk to your health care provider about how often you should have regular mammograms. Talk with your health care provider about your test results, treatment options, and if necessary, the need for more tests. Vaccines  Your health care provider may recommend certain vaccines, such as: Influenza vaccine. This is recommended every year. Tetanus, diphtheria, and acellular pertussis (Tdap, Td) vaccine. You may need a Td booster every 10 years. Zoster vaccine. You may need this after age 33. Pneumococcal 13-valent conjugate (PCV13) vaccine. One dose is recommended after age 75. Pneumococcal polysaccharide (PPSV23) vaccine. One dose is recommended after age 58. Talk to your health care provider about which screenings and vaccines you need and how often you need  them. This information is not intended to replace advice given to you by your health care provider.  Make sure you discuss any questions you have with your health care provider. Document Released: 09/21/2015 Document Revised: 05/14/2016 Document Reviewed: 06/26/2015 Elsevier Interactive Patient Education  2017 [employer].  Fall Prevention in the Home Falls can cause injuries. They can happen to people of all ages. There are many things you can do to make your home safe and to help prevent falls. What can I do on the outside of my home? Regularly fix the edges of walkways and driveways and fix any cracks. Remove anything that might make you trip as you walk through a door, such as a raised step or threshold. Trim any bushes or trees on the path to your home. Use bright outdoor lighting. Clear any walking paths of anything that might make someone trip, such as rocks or tools. Regularly check to see if handrails are loose or broken. Make sure that both sides of any steps have handrails. Any raised decks and porches should have guardrails on the edges. Have any leaves, snow, or ice cleared regularly. Use sand or salt on walking paths during winter. Clean up any spills in your garage right away. This includes oil or grease spills. What can I do in the bathroom? Use night lights. Install grab bars by the toilet and in the tub and shower. Do not use towel bars as grab bars. Use non-skid mats or decals in the tub or shower. If you need to sit down in the shower, use a plastic, non-slip stool. Keep the floor dry. Clean up any water that spills on the floor as soon as it happens. Remove soap buildup in the tub or shower regularly. Attach bath mats securely with double-sided non-slip rug tape. Do not have throw rugs and other things on the floor that can make you trip. What can I do in the bedroom? Use night lights. Make sure that you have a light by your bed that is easy to reach. Do not use any sheets or blankets that are too big for your bed. They should not hang down onto the floor. Have a  firm chair that has side arms. You can use this for support while you get dressed. Do not have throw rugs and other things on the floor that can make you trip. What can I do in the kitchen? Clean up any spills right away. Avoid walking on wet floors. Keep items that you use a lot in easy-to-reach places. If you need to reach something above you, use a strong step stool that has a grab bar. Keep electrical cords out of the way. Do not use floor polish or wax that makes floors slippery. If you must use wax, use non-skid floor wax. Do not have throw rugs and other things on the floor that can make you trip. What can I do with my stairs? Do not leave any items on the stairs. Make sure that there are handrails on both sides of the stairs and use them. Fix handrails that are broken or loose. Make sure that handrails are as long as the stairways. Check any carpeting to make sure that it is firmly attached to the stairs. Fix any carpet that is loose or worn. Avoid having throw rugs at the top or bottom of the stairs. If you do have throw rugs, attach them to the floor with carpet tape. Make sure that you have a light switch at  the top of the stairs and the bottom of the stairs. If you do not have them, ask someone to add them for you. What else can I do to help prevent falls? Wear shoes that: Do not have high heels. Have rubber bottoms. Are comfortable and fit you well. Are closed at the toe. Do not wear sandals. If you use a stepladder: Make sure that it is fully opened. Do not climb a closed stepladder. Make sure that both sides of the stepladder are locked into place. Ask someone to hold it for you, if possible. Clearly mark and make sure that you can see: Any grab bars or handrails. First and last steps. Where the edge of each step is. Use tools that help you move around (mobility aids) if they are needed. These include: Canes. Walkers. Scooters. Crutches. Turn on the lights when you  go into a dark area. Replace any light bulbs as soon as they burn out. Set up your furniture so you have a clear path. Avoid moving your furniture around. If any of your floors are uneven, fix them. If there are any pets around you, be aware of where they are. Review your medicines with your doctor. Some medicines can make you feel dizzy. This can increase your chance of falling. Ask your doctor what other things that you can do to help prevent falls. This information is not intended to replace advice given to you by your health care provider. Make sure you discuss any questions you have with your health care provider. Document Released: 06/21/2009 Document Revised: 01/31/2016 Document Reviewed: 09/29/2014 Elsevier Interactive Patient Education  2017 [employer].

## 2023-04-29 NOTE — Progress Notes (Signed)
Subjective:   Sylvia Stevens is a 72 y.o. female who presents for Medicare Annual (Subsequent) preventive examination.  Visit Complete: Virtual  I connected with  Monna Fam on 04/29/23 by a audio enabled telemedicine application and verified that I am speaking with the correct person using two identifiers.  Patient Location: Home  Provider Location: Home Office  I discussed the limitations of evaluation and management by telemedicine. The patient expressed understanding and agreed to proceed.  Patient Medicare AWV questionnaire was completed by the patient on 04-25-2023; I have confirmed that all information answered by patient is correct and no changes since this date.  Review of Systems     Vital Signs: Unable to obtain new vitals due to this being a telehealth visit.  Cardiac Risk Factors include: advanced age (>7men, >98 women);family history of premature cardiovascular disease     Objective:    Today's Vitals   04/29/23 0933  PainSc: 3    There is no height or weight on file to calculate BMI.     04/29/2023    9:41 AM 05/20/2021    8:34 AM 04/10/2021   11:00 AM 04/23/2020    8:11 AM 07/06/2018    9:14 AM  Advanced Directives  Does Patient Have a Medical Advance Directive? Yes No Yes Yes Yes  Type of Advance Directive Living will Living will Living will Living will Living will  Does patient want to make changes to medical advance directive?   No - Patient declined    Would patient like information on creating a medical advance directive?  No - Patient declined       Current Medications (verified) Outpatient Encounter Medications as of 04/29/2023  Medication Sig   aspirin 81 MG tablet Take 81 mg by mouth daily.   cholecalciferol (VITAMIN D) 1000 UNITS tablet Take 2,000 Units by mouth daily.    COVID-19 mRNA vaccine 2023-2024 (COMIRNATY) SUSP injection Inject into the muscle.   cyanocobalamin 1000 MCG tablet Take 1,000 mcg by mouth daily.   ezetimibe (ZETIA) 10 MG  tablet TAKE 1 TABLET(10 MG) BY MOUTH DAILY   levothyroxine (SYNTHROID) 88 MCG tablet Take 1 tablet (88 mcg total) by mouth daily before breakfast.   lisinopril (ZESTRIL) 20 MG tablet TAKE 1 TABLET(20 MG) BY MOUTH DAILY   No facility-administered encounter medications on file as of 04/29/2023.    Allergies (verified) Atorvastatin, Lobster [shellfish allergy], Penicillins, Strawberry extract, Erythromycin, Flexeril [cyclobenzaprine], and Omnipaque [iohexol]   History: Past Medical History:  Diagnosis Date   Arthritis    Endometrial polyp    Hypertension    Osteopenia 09/16/2012   T score -1.3 FRAX 7.6%/0.9%   Thyroid disease    hypothyroid   Past Surgical History:  Procedure Laterality Date   APPENDECTOMY     Bladder Bx     CARPAL TUNNEL RELEASE     X 2   CESAREAN SECTION     X 2   HERNIA REPAIR     TOE SURGERY     TUBAL LIGATION     Family History  Problem Relation Age of Onset   Alzheimer's disease Mother    Heart disease Father    Pancreatic cancer Father    Arthritis Father    Hyperlipidemia Father    Hypertension Father    Cancer Paternal Aunt        Ovarian or Uterine cancer   Aneurysm Paternal Uncle    Colon cancer Neg Hx    Rectal cancer Neg Hx  Stomach cancer Neg Hx    Esophageal cancer Neg Hx    Social History   Socioeconomic History   Marital status: Married    Spouse name: Not on file   Number of children: 2   Years of education: Not on file   Highest education level: Not on file  Occupational History   Occupation: retired  Tobacco Use   Smoking status: Every Day    Current packs/day: 0.50    Types: Cigarettes   Smokeless tobacco: Never  Vaping Use   Vaping status: Never Used  Substance and Sexual Activity   Alcohol use: Yes    Alcohol/week: 0.0 standard drinks of alcohol    Comment: rare   Drug use: No   Sexual activity: Never    Birth control/protection: Surgical  Other Topics Concern   Not on file  Social History Narrative    Lives with husband in a one story home.  Has 2 children.  Works part time as an Gaffer.  Education: some college.   Social Determinants of Health   Financial Resource Strain: Low Risk  (04/29/2023)   Overall Financial Resource Strain (CARDIA)    Difficulty of Paying Living Expenses: Not hard at all  Food Insecurity: No Food Insecurity (04/29/2023)   Hunger Vital Sign    Worried About Running Out of Food in the Last Year: Never true    Ran Out of Food in the Last Year: Never true  Transportation Needs: No Transportation Needs (04/29/2023)   PRAPARE - Administrator, Civil Service (Medical): No    Lack of Transportation (Non-Medical): No  Physical Activity: Sufficiently Active (04/29/2023)   Exercise Vital Sign    Days of Exercise per Week: 5 days    Minutes of Exercise per Session: 30 min  Stress: No Stress Concern Present (04/29/2023)   [employer] of Occupational Health - Occupational Stress Questionnaire    Feeling of Stress : Not at all  Social Connections: Socially Integrated (04/29/2023)   Social Connection and Isolation Panel [NHANES]    Frequency of Communication with Friends and Family: More than three times a week    Frequency of Social Gatherings with Friends and Family: Twice a week    Attends Religious Services: More than 4 times per year    Active Member of [employer] or Organizations: Yes    Attends Engineer, structural: More than 4 times per year    Marital Status: Married    Tobacco Counseling Ready to quit: Not Answered Counseling given: Not Answered   Clinical Intake:  Pre-visit preparation completed: Yes  Pain : 0-10 Pain Score: 3  Pain Type: Chronic pain Pain Onset: More than a month ago Pain Frequency: Constant     Diabetes: No  How often do you need to have someone help you when you read instructions, pamphlets, or other written materials from your doctor or pharmacy?: 1 - Never  Interpreter Needed?:  No  Information entered by :: Remi Haggard LPN   Activities of Daily Living    04/29/2023    9:36 AM 04/25/2023    9:23 AM  In your present state of health, do you have any difficulty performing the following activities:  Hearing? 0 0  Vision? 0 0  Difficulty concentrating or making decisions? 0 0  Walking or climbing stairs? 0 0  Dressing or bathing? 0 0  Doing errands, shopping? 0 0  Preparing Food and eating ? N N  Using the Toilet? N  N  In the past six months, have you accidently leaked urine? Y Y  Do you have problems with loss of bowel control? N N  Managing your Medications? N N  Managing your Finances? N N  Housekeeping or managing your Housekeeping? N N    Patient Care Team: Sheliah Hatch, MD as PCP - General (Family Medicine) Sheran Luz, MD as Consulting Physician (Physical Medicine and Rehabilitation) Carlus Pavlov, MD as Consulting Physician (Internal Medicine) Sharrell Ku, MD as Consulting Physician (Gastroenterology)  Indicate any recent Medical Services you may have received from other than [facility] providers in the past year (date may be approximate).     Assessment:   This is a routine wellness examination for [place].  Hearing/Vision screen Hearing Screening - Comments:: No trouble hearing Vision Screening - Comments:: Up to date [facility]  Dietary issues and exercise activities discussed:     Goals Addressed             This Visit's Progress    Patient Stated       Travel to [place]       Depression Screen    04/29/2023    9:38 AM 07/10/2022   10:37 AM 05/26/2022    8:55 AM 04/08/2022   10:50 AM 10/30/2021    9:20 AM 09/27/2021    9:17 AM 08/27/2021    1:29 PM  PHQ 2/9 Scores  PHQ - 2 Score 0 0 0 0 0 0 0  PHQ- 9 Score 0 0 0 0 0 1 0    Fall Risk    04/29/2023    9:33 AM 04/25/2023    9:23 AM 07/10/2022   10:35 AM 07/06/2022   10:26 AM 05/26/2022    8:55 AM  Fall Risk   Falls in the past year? 1 1 1 1 1   Number falls in past  yr: 0 0 1 1 1   Injury with Fall? 0 0 1 0 0  Risk for fall due to :   History of fall(s);Impaired balance/gait;Orthopedic patient  History of fall(s)  Follow up Falls evaluation completed;Education provided;Falls prevention discussed  Education provided;Falls prevention discussed  Falls evaluation completed    MEDICARE RISK AT HOME: Medicare Risk at Home Any stairs in or around the home?: (P) No Home free of loose throw rugs in walkways, pet beds, electrical cords, etc?: (P) Yes Adequate lighting in your home to reduce risk of falls?: (P) Yes Life alert?: (P) No Use of a cane, walker or w/c?: (P) No Grab bars in the bathroom?: (P) Yes Shower chair or bench in shower?: (P) No Elevated toilet seat or a handicapped toilet?: (P) No  TIMED UP AND GO:  Was the test performed?  No    Cognitive Function:    07/06/2018    9:16 AM  MMSE - Mini Mental State Exam  Orientation to time 5  Orientation to Place 5  Registration 3  Attention/ Calculation 5  Recall 3  Language- name 2 objects 2  Language- repeat 1  Language- follow 3 step command 3  Language- read & follow direction 1  Write a sentence 1  Copy design 1  Total score 30        04/29/2023    9:35 AM 07/10/2022   10:38 AM  6CIT Screen  What Year? 0 points 0 points  What month? 0 points 0 points  What time? 0 points 0 points  Count back from 20 0 points 0 points  Months in reverse  0 points 0 points  Repeat phrase 2 points 0 points  Total Score 2 points 0 points    Immunizations Immunization History  Administered Date(s) Administered   COVID-19, mRNA, vaccine(Comirnaty)12 years and older 06/18/2022   Fluad Quad(high Dose 65+) 05/28/2020, 05/24/2021   Influenza Whole 06/10/2012, 06/14/2013   Influenza, High Dose Seasonal PF 05/26/2022   Influenza,inj,Quad PF,6+ Mos 06/05/2014, 05/11/2015   Influenza,inj,quad, With Preservative 05/19/2018   Influenza-Unspecified 05/23/2016, 05/15/2017, 05/19/2018, 04/29/2019    PFIZER Comirnaty(Gray Top)Covid-19 Tri-Sucrose Vaccine 01/07/2021   PFIZER(Purple Top)SARS-COV-2 Vaccination 10/14/2019, 11/09/2019, 07/06/2020, 06/28/2021   Pfizer Covid-19 Vaccine Bivalent Booster 67yrs & up 06/28/2021   Pneumococcal Conjugate-13 04/28/2017   Pneumococcal Polysaccharide-23 07/06/2018   Tdap 09/08/2010, 07/23/2018   Zoster, Live 06/22/2013    TDAP status: Up to date  Flu Vaccine status: Up to date  Pneumococcal vaccine status: Up to date  Covid-19 vaccine status: Information provided on how to obtain vaccines.   Qualifies for Shingles Vaccine? Yes   Zostavax completed No   Shingrix Completed?: No.    Education has been provided regarding the importance of this vaccine. Patient has been advised to call insurance company to determine out of pocket expense if they have not yet received this vaccine. Advised may also receive vaccine at local pharmacy or Health Dept. Verbalized acceptance and understanding.  Screening Tests Health Maintenance  Topic Date Due   Hepatitis C Screening  Never done   DEXA SCAN  10/25/2022   INFLUENZA VACCINE  04/09/2023   MAMMOGRAM  11/04/2023   Medicare Annual Wellness (AWV)  04/28/2024   DTaP/Tdap/Td (3 - Td or Tdap) 07/23/2028   Pneumonia Vaccine 74+ Years old  Completed   HPV VACCINES  Aged Out   Colonoscopy  Discontinued   COVID-19 Vaccine  Discontinued   Zoster Vaccines- Shingrix  Discontinued    Health Maintenance  Health Maintenance Due  Topic Date Due   Hepatitis C Screening  Never done   DEXA SCAN  10/25/2022   INFLUENZA VACCINE  04/09/2023    Colorectal cancer screening: Type of screening: Colonoscopy. Completed 2023. Repeat every none years  Mammogram status: Completed  . Repeat every year  Bone Density status: Ordered  . Pt provided with contact info and advised to call to schedule appt.  Lung Cancer Screening: (Low Dose CT Chest recommended if Age 58-80 years, 20 pack-year currently smoking OR have quit w/in  15years.) does not qualify.   Lung Cancer Screening Referral:   Additional Screening:  Hepatitis C Screening: does not qualify;  Vision Screening: Recommended annual ophthalmology exams for early detection of glaucoma and other disorders of the eye. Is the patient up to date with their annual eye exam?  Yes  Who is the provider or what is the name of the office in which the patient attends annual eye exams? [facility] If pt is not established with a provider, would they like to be referred to a provider to establish care? No .   Dental Screening: Recommended annual dental exams for proper oral hygiene   Community Resource Referral / Chronic Care Management: CRR required this visit?  No   CCM required this visit?  No     Plan:     I have personally reviewed and noted the following in the patient's chart:   Medical and social history Use of alcohol, tobacco or illicit drugs  Current medications and supplements including opioid prescriptions. Patient is not currently taking opioid prescriptions. Functional ability and status Nutritional status Physical activity  Advanced directives List of other physicians Hospitalizations, surgeries, and ER visits in previous 12 months Vitals Screenings to include cognitive, depression, and falls Referrals and appointments  In addition, I have reviewed and discussed with patient certain preventive protocols, quality metrics, and best practice recommendations. A written personalized care plan for preventive services as well as general preventive health recommendations were provided to patient.     Remi Haggard, LPN   [id number]   After Visit Summary: (MyChart) Due to this being a telephonic visit, the after visit summary with patients personalized plan was offered to patient via MyChart   Nurse Notes:

## 2023-05-25 ENCOUNTER — Encounter: Payer: PPO | Admitting: Family Medicine

## 2023-05-28 ENCOUNTER — Encounter: Payer: Self-pay | Admitting: Family Medicine

## 2023-05-28 ENCOUNTER — Ambulatory Visit (INDEPENDENT_AMBULATORY_CARE_PROVIDER_SITE_OTHER): Payer: PPO | Admitting: Family Medicine

## 2023-05-28 VITALS — BP 130/62 | HR 65 | Temp 98.6°F | Ht 62.0 in | Wt 136.8 lb

## 2023-05-28 DIAGNOSIS — I10 Essential (primary) hypertension: Secondary | ICD-10-CM | POA: Diagnosis not present

## 2023-05-28 DIAGNOSIS — Z23 Encounter for immunization: Secondary | ICD-10-CM | POA: Diagnosis not present

## 2023-05-28 DIAGNOSIS — Z0000 Encounter for general adult medical examination without abnormal findings: Secondary | ICD-10-CM | POA: Diagnosis not present

## 2023-05-28 DIAGNOSIS — E559 Vitamin D deficiency, unspecified: Secondary | ICD-10-CM | POA: Diagnosis not present

## 2023-05-28 LAB — CBC WITH DIFFERENTIAL/PLATELET
Basophils Absolute: 0.1 10*3/uL (ref 0.0–0.1)
Basophils Relative: 1 % (ref 0.0–3.0)
Eosinophils Absolute: 0.2 10*3/uL (ref 0.0–0.7)
Eosinophils Relative: 2.4 % (ref 0.0–5.0)
HCT: 41 % (ref 36.0–46.0)
Hemoglobin: 13.2 g/dL (ref 12.0–15.0)
Lymphocytes Relative: 24 % (ref 12.0–46.0)
Lymphs Abs: 1.6 10*3/uL (ref 0.7–4.0)
MCHC: 32.1 g/dL (ref 30.0–36.0)
MCV: 93.8 fl (ref 78.0–100.0)
Monocytes Absolute: 0.6 10*3/uL (ref 0.1–1.0)
Monocytes Relative: 8.5 % (ref 3.0–12.0)
Neutro Abs: 4.4 10*3/uL (ref 1.4–7.7)
Neutrophils Relative %: 64.1 % (ref 43.0–77.0)
Platelets: 309 10*3/uL (ref 150.0–400.0)
RBC: 4.38 Mil/uL (ref 3.87–5.11)
RDW: 14.3 % (ref 11.5–15.5)
WBC: 6.8 10*3/uL (ref 4.0–10.5)

## 2023-05-28 LAB — LIPID PANEL
Cholesterol: 171 mg/dL (ref 0–200)
HDL: 51.5 mg/dL
LDL Cholesterol: 101 mg/dL — ABNORMAL HIGH (ref 0–99)
NonHDL: 119.15
Total CHOL/HDL Ratio: 3
Triglycerides: 90 mg/dL (ref 0.0–149.0)
VLDL: 18 mg/dL (ref 0.0–40.0)

## 2023-05-28 LAB — BASIC METABOLIC PANEL
BUN: 14 mg/dL (ref 6–23)
CO2: 29 mEq/L (ref 19–32)
Calcium: 9.6 mg/dL (ref 8.4–10.5)
Chloride: 105 mEq/L (ref 96–112)
Creatinine, Ser: 1 mg/dL (ref 0.40–1.20)
GFR: 56.67 mL/min — ABNORMAL LOW (ref >60.00)
Glucose, Bld: 91 mg/dL (ref 70–99)
Potassium: 4.2 mEq/L (ref 3.5–5.1)
Sodium: 140 mEq/L (ref 135–145)

## 2023-05-28 LAB — HEPATIC FUNCTION PANEL
ALT: 15 U/L (ref 0–35)
AST: 20 U/L (ref 0–37)
Albumin: 4 g/dL (ref 3.5–5.2)
Alkaline Phosphatase: 62 U/L (ref 39–117)
Bilirubin, Direct: 0.1 mg/dL (ref 0.0–0.3)
Total Bilirubin: 0.7 mg/dL (ref 0.2–1.2)
Total Protein: 6.9 g/dL (ref 6.0–8.3)

## 2023-05-28 LAB — TSH: TSH: 0.5 u[IU]/mL (ref 0.35–5.50)

## 2023-05-28 LAB — VITAMIN D 25 HYDROXY (VIT D DEFICIENCY, FRACTURES): VITD: 58.41 ng/mL (ref 30.00–100.00)

## 2023-05-28 NOTE — Assessment & Plan Note (Signed)
Check labs and replete prn.

## 2023-05-28 NOTE — Progress Notes (Signed)
Subjective:    Patient ID: Sylvia Stevens, female    DOB: August 14, 1951, 72 y.o.   MRN: [id number]  HPI CPE- UTD on mammo, colonoscopy, Tdap.  Will get flu today.  DEXA ordered- scheduled for March  Patient Care Team    Relationship Specialty Notifications Start End  Sheliah Hatch, MD PCP - General Family Medicine  11/28/11   Sheran Luz, MD Consulting Physician Physical Medicine and Rehabilitation  04/07/14   Carlus Pavlov, MD Consulting Physician Internal Medicine  03/29/15   Sharrell Ku, MD Consulting Physician Gastroenterology  03/29/15      Health Maintenance  Topic Date Due   Hepatitis C Screening  Never done   DEXA SCAN  10/25/2022   INFLUENZA VACCINE  04/09/2023   MAMMOGRAM  11/04/2023   Medicare Annual Wellness (AWV)  04/28/2024   DTaP/Tdap/Td (3 - Td or Tdap) 07/23/2028   Pneumonia Vaccine 82+ Years old  Completed   HPV VACCINES  Aged Out   Colonoscopy  Discontinued   COVID-19 Vaccine  Discontinued   Zoster Vaccines- Shingrix  Discontinued     Review of Systems Patient reports no vision/ hearing changes, adenopathy,fever, weight change,  persistant/recurrent hoarseness , swallowing issues, chest pain, palpitations, edema, persistant/recurrent cough, hemoptysis, dyspnea (rest/exertional/paroxysmal nocturnal), abdominal pain, significant heartburn, bowel changes, GU symptoms (dysuria, hematuria, incontinence), Gyn symptoms (abnormal  bleeding, pain),  syncope, focal weakness, memory loss, numbness & tingling, skin/hair/nail changes, abnormal bruising or bleeding, anxiety, or depression.     Objective:   Physical Exam General Appearance:    Alert, cooperative, no distress, appears stated age  Head:    Normocephalic, without obvious abnormality, atraumatic  Eyes:    PERRL, conjunctiva/corneas clear, EOM's intact both eyes  Ears:    Normal TM's and external ear canals, both ears  Nose:   Nares normal, septum midline, mucosa normal, no drainage    or sinus  tenderness  Throat:   Lips, mucosa, and tongue normal; teeth and gums normal  Neck:   Supple, symmetrical, trachea midline, no adenopathy;    Thyroid: no enlargement/tenderness/nodules  Back:     Symmetric, no curvature, ROM normal, no CVA tenderness  Lungs:     Clear to auscultation bilaterally, respirations unlabored  Chest Wall:    No tenderness or deformity   Heart:    Regular rate and rhythm, S1 and S2 normal, no murmur, rub   or gallop  Breast Exam:    Deferred to mammo  Abdomen:     Soft, non-tender, bowel sounds active all four quadrants,    no masses, no organomegaly  Genitalia:    Deferred to GYN  Rectal:    Extremities:   Extremities normal, atraumatic, no cyanosis or edema  Pulses:   2+ and symmetric all extremities  Skin:   Skin color, texture, turgor normal, no rashes or lesions  Lymph nodes:   Cervical, supraclavicular, and axillary nodes normal  Neurologic:   CNII-XII intact, normal strength, sensation and reflexes    throughout          Assessment & Plan:

## 2023-05-28 NOTE — Addendum Note (Signed)
Addended by: Christy Sartorius on: 05/28/2023 09:34 AM   Modules accepted: Orders

## 2023-05-28 NOTE — Assessment & Plan Note (Signed)
Adequate control today.  Asymptomatic.  Check labs due to ACE use.  No anticipated med changes.

## 2023-05-28 NOTE — Patient Instructions (Signed)
Follow up in 6 months to recheck blood pressure and cholesterol We'll notify you of your lab results and make any changes if needed Continue to work on healthy diet and regular exercise- you look great! Call with any questions or concerns Stay Safe!  Stay Healthy! Happy Fall!!!

## 2023-05-28 NOTE — Assessment & Plan Note (Signed)
Pt's PE WNL.  UTD on mammo, colonoscopy, Tdap, PNA.  DEXA scheduled.  Flu shot given today.  Check labs.  Anticipatory guidance provided.

## 2023-06-29 DIAGNOSIS — H04123 Dry eye syndrome of bilateral lacrimal glands: Secondary | ICD-10-CM | POA: Diagnosis not present

## 2023-06-29 DIAGNOSIS — H43812 Vitreous degeneration, left eye: Secondary | ICD-10-CM | POA: Diagnosis not present

## 2023-06-29 DIAGNOSIS — H1045 Other chronic allergic conjunctivitis: Secondary | ICD-10-CM | POA: Diagnosis not present

## 2023-06-29 DIAGNOSIS — H2513 Age-related nuclear cataract, bilateral: Secondary | ICD-10-CM | POA: Diagnosis not present

## 2023-07-22 ENCOUNTER — Other Ambulatory Visit: Payer: Self-pay | Admitting: Family Medicine

## 2023-07-22 DIAGNOSIS — E785 Hyperlipidemia, unspecified: Secondary | ICD-10-CM

## 2023-08-20 ENCOUNTER — Other Ambulatory Visit: Payer: Self-pay | Admitting: Family Medicine

## 2023-08-20 DIAGNOSIS — I10 Essential (primary) hypertension: Secondary | ICD-10-CM

## 2023-08-30 ENCOUNTER — Telehealth: Payer: PPO | Admitting: Physician Assistant

## 2023-08-30 DIAGNOSIS — N3000 Acute cystitis without hematuria: Secondary | ICD-10-CM | POA: Diagnosis not present

## 2023-08-30 MED ORDER — NITROFURANTOIN MONOHYD MACRO 100 MG PO CAPS
100.0000 mg | ORAL_CAPSULE | Freq: Two times a day (BID) | ORAL | 0 refills | Status: AC
Start: 2023-08-30 — End: 2023-09-06

## 2023-08-30 NOTE — Progress Notes (Signed)
E-Visit for Urinary Problems  We are sorry that you are not feeling well.  Here is how we plan to help!  Based on what you shared with me it looks like you most likely have a simple urinary tract infection.  A UTI (Urinary Tract Infection) is a bacterial infection of the bladder.  Most cases of urinary tract infections are simple to treat but a key part of your care is to encourage you to drink plenty of fluids and watch your symptoms carefully.  I have prescribed MacroBid 100 mg twice a day for 5 days.  Your symptoms should gradually improve. Call us if the burning in your urine worsens, you develop worsening fever, back pain or pelvic pain or if your symptoms do not resolve after completing the antibiotic.  Urinary tract infections can be prevented by drinking plenty of water to keep your body hydrated.  Also be sure when you wipe, wipe from front to back and don't hold it in!  If possible, empty your bladder every 4 hours.  HOME CARE Drink plenty of fluids Compete the full course of the antibiotics even if the symptoms resolve Remember, when you need to go.go. Holding in your urine can increase the likelihood of getting a UTI! GET HELP RIGHT AWAY IF: You cannot urinate You get a high fever Worsening back pain occurs You see blood in your urine You feel sick to your stomach or throw up You feel like you are going to pass out  MAKE SURE YOU  Understand these instructions. Will watch your condition. Will get help right away if you are not doing well or get worse.   Thank you for choosing an e-visit.  Your e-visit answers were reviewed by a board certified advanced clinical practitioner to complete your personal care plan. Depending upon the condition, your plan could have included both over the counter or prescription medications.  Please review your pharmacy choice. Make sure the pharmacy is open so you can pick up prescription now. If there is a problem, you may contact your  provider through MyChart messaging and have the prescription routed to another pharmacy.  Your safety is important to us. If you have drug allergies check your prescription carefully.   For the next 24 hours you can use MyChart to ask questions about today's visit, request a non-urgent call back, or ask for a work or school excuse. You will get an email in the next two days asking about your experience. I hope that your e-visit has been valuable and will speed your recovery.    have provided 5 minutes of non face to face time during this encounter for chart review and documentation.   

## 2023-09-13 ENCOUNTER — Encounter (HOSPITAL_BASED_OUTPATIENT_CLINIC_OR_DEPARTMENT_OTHER): Payer: Self-pay

## 2023-09-13 ENCOUNTER — Emergency Department (HOSPITAL_BASED_OUTPATIENT_CLINIC_OR_DEPARTMENT_OTHER): Payer: PPO

## 2023-09-13 ENCOUNTER — Emergency Department (HOSPITAL_BASED_OUTPATIENT_CLINIC_OR_DEPARTMENT_OTHER)
Admission: EM | Admit: 2023-09-13 | Discharge: 2023-09-13 | Disposition: A | Discharge status: Home / Self Care | Payer: PPO | Attending: Emergency Medicine | Admitting: Emergency Medicine

## 2023-09-13 ENCOUNTER — Emergency Department (HOSPITAL_BASED_OUTPATIENT_CLINIC_OR_DEPARTMENT_OTHER): Payer: PPO | Admitting: Radiology

## 2023-09-13 DIAGNOSIS — M545 Low back pain, unspecified: Secondary | ICD-10-CM | POA: Insufficient documentation

## 2023-09-13 DIAGNOSIS — M4317 Spondylolisthesis, lumbosacral region: Secondary | ICD-10-CM | POA: Diagnosis not present

## 2023-09-13 DIAGNOSIS — Z7982 Long term (current) use of aspirin: Secondary | ICD-10-CM | POA: Diagnosis not present

## 2023-09-13 DIAGNOSIS — I10 Essential (primary) hypertension: Secondary | ICD-10-CM | POA: Diagnosis not present

## 2023-09-13 DIAGNOSIS — I700 Atherosclerosis of aorta: Secondary | ICD-10-CM | POA: Diagnosis not present

## 2023-09-13 DIAGNOSIS — M549 Dorsalgia, unspecified: Secondary | ICD-10-CM | POA: Diagnosis not present

## 2023-09-13 DIAGNOSIS — E039 Hypothyroidism, unspecified: Secondary | ICD-10-CM | POA: Insufficient documentation

## 2023-09-13 DIAGNOSIS — Z79899 Other long term (current) drug therapy: Secondary | ICD-10-CM | POA: Insufficient documentation

## 2023-09-13 DIAGNOSIS — R509 Fever, unspecified: Secondary | ICD-10-CM | POA: Diagnosis not present

## 2023-09-13 DIAGNOSIS — R1030 Lower abdominal pain, unspecified: Secondary | ICD-10-CM | POA: Diagnosis not present

## 2023-09-13 DIAGNOSIS — R110 Nausea: Secondary | ICD-10-CM | POA: Insufficient documentation

## 2023-09-13 DIAGNOSIS — R9389 Abnormal findings on diagnostic imaging of other specified body structures: Secondary | ICD-10-CM | POA: Diagnosis not present

## 2023-09-13 DIAGNOSIS — Z20822 Contact with and (suspected) exposure to covid-19: Secondary | ICD-10-CM | POA: Diagnosis not present

## 2023-09-13 DIAGNOSIS — R1032 Left lower quadrant pain: Secondary | ICD-10-CM | POA: Insufficient documentation

## 2023-09-13 DIAGNOSIS — R109 Unspecified abdominal pain: Secondary | ICD-10-CM | POA: Diagnosis not present

## 2023-09-13 LAB — URINALYSIS, W/ REFLEX TO CULTURE (INFECTION SUSPECTED)
Bilirubin Urine: NEGATIVE
Glucose, UA: NEGATIVE mg/dL
Hgb urine dipstick: NEGATIVE
Ketones, ur: NEGATIVE mg/dL
Leukocytes,Ua: NEGATIVE
Nitrite: NEGATIVE
Protein, ur: NEGATIVE mg/dL
Specific Gravity, Urine: <1.005 — ABNORMAL LOW (ref 1.005–1.030)
pH: 7 (ref 5.0–8.0)

## 2023-09-13 LAB — CBC WITH DIFFERENTIAL/PLATELET
Abs Immature Granulocytes: 0.02 10*3/uL (ref 0.00–0.07)
Basophils Absolute: 0.1 10*3/uL (ref 0.0–0.1)
Basophils Relative: 1 %
Eosinophils Absolute: 0.1 10*3/uL (ref 0.0–0.5)
Eosinophils Relative: 1 %
HCT: 37.8 % (ref 36.0–46.0)
Hemoglobin: 12.8 g/dL (ref 12.0–15.0)
Immature Granulocytes: 0 %
Lymphocytes Relative: 13 %
Lymphs Abs: 1.2 10*3/uL (ref 0.7–4.0)
MCH: 30.5 pg (ref 26.0–34.0)
MCHC: 33.9 g/dL (ref 30.0–36.0)
MCV: 90.2 fL (ref 80.0–100.0)
Monocytes Absolute: 0.6 10*3/uL (ref 0.1–1.0)
Monocytes Relative: 6 %
Neutro Abs: 7.3 10*3/uL (ref 1.7–7.7)
Neutrophils Relative %: 79 %
Platelets: 268 10*3/uL (ref 150–400)
RBC: 4.19 MIL/uL (ref 3.87–5.11)
RDW: 14 % (ref 11.5–15.5)
WBC: 9.1 10*3/uL (ref 4.0–10.5)
nRBC: 0 % (ref 0.0–0.2)

## 2023-09-13 LAB — COMPREHENSIVE METABOLIC PANEL
ALT: 14 U/L (ref 0–44)
AST: 17 U/L (ref 15–41)
Albumin: 4.1 g/dL (ref 3.5–5.0)
Alkaline Phosphatase: 54 U/L (ref 38–126)
Anion gap: 10 (ref 5–15)
BUN: 16 mg/dL (ref 8–23)
CO2: 24 mmol/L (ref 22–32)
Calcium: 9.3 mg/dL (ref 8.9–10.3)
Chloride: 105 mmol/L (ref 98–111)
Creatinine, Ser: 0.98 mg/dL (ref 0.44–1.00)
GFR, Estimated: >60 mL/min (ref >60)
Glucose, Bld: 110 mg/dL — ABNORMAL HIGH (ref 70–99)
Potassium: 4 mmol/L (ref 3.5–5.1)
Sodium: 139 mmol/L (ref 135–145)
Total Bilirubin: 0.8 mg/dL (ref 0.0–1.2)
Total Protein: 7 g/dL (ref 6.5–8.1)

## 2023-09-13 LAB — RESP PANEL BY RT-PCR (RSV, FLU A&B, COVID)  RVPGX2
Influenza A by PCR: NEGATIVE
Influenza B by PCR: NEGATIVE
Resp Syncytial Virus by PCR: NEGATIVE
SARS Coronavirus 2 by RT PCR: NEGATIVE

## 2023-09-13 LAB — LACTIC ACID, PLASMA: Lactic Acid, Venous: 1.3 mmol/L (ref 0.5–1.9)

## 2023-09-13 NOTE — ED Notes (Signed)
 Patient transported to CT

## 2023-09-13 NOTE — ED Provider Notes (Signed)
 [facility] EMERGENCY DEPARTMENT AT [facility] Provider Note   CSN: [id number] Arrival date & time: 09/13/23  9150     History  Chief Complaint  Patient presents with   fever/flank pain    Sylvia Stevens is a 73 y.o. female.  HPI      2 weeks ago had UTI, finished macrobid  one week ago Yesterday lower back was hurting but was putting away xmas tree 2AM began to feel very bad pain 9/10 and makes nausea, worse on left side but on both flanks, trying to move is severe. Is painful when still too. Nausea, no vomiting Fever 100.2 at home. Smoke no worsening cough No congestion or sore throat Body aches with fever, chills No urinary symptoms, no numbness or focal weakness Does have some abdominal pain lower abdomen No chest pain or dyspnea No hx of IVDU  Past Medical History:  Diagnosis Date   Arthritis    Endometrial polyp    Hypertension    Osteopenia 09/16/2012   T score -1.3 FRAX 7.6%/0.9%   Thyroid  disease    hypothyroid    Home Medications Prior to Admission medications   Medication Sig Start Date End Date Taking? Authorizing Provider  aspirin 81 MG tablet Take 81 mg by mouth daily.    [provider]  cholecalciferol (VITAMIN D ) 1000 UNITS tablet Take 2,000 Units by mouth daily.     [provider]  COVID-19 mRNA vaccine [phone] (COMIRNATY ) SUSP injection Inject into the muscle. 06/18/22   Luiz Channel, MD  cyanocobalamin  1000 MCG tablet Take 1,000 mcg by mouth daily.    [provider]  ezetimibe  (ZETIA ) 10 MG tablet TAKE 1 TABLET(10 MG) BY MOUTH DAILY 07/22/23   Tabori, Katherine E, MD  levothyroxine  (SYNTHROID ) 88 MCG tablet Take 1 tablet (88 mcg total) by mouth daily before breakfast. 01/29/23   Trixie File, MD  lisinopril  (ZESTRIL ) 20 MG tablet TAKE 1 TABLET(20 MG) BY MOUTH DAILY 08/20/23   Tabori, Katherine E, MD      Allergies    Atorvastatin , Lobster [shellfish allergy], Penicillins, Strawberry extract,  Erythromycin, Flexeril  [cyclobenzaprine ], and Omnipaque [iohexol]    Review of Systems   Review of Systems  Physical Exam Updated Vital Signs BP 108/65   Pulse 74   Temp 98.7 F (37.1 C) (Oral)   Resp 18   SpO2 99%  Physical Exam Vitals and nursing note reviewed.  Constitutional:      General: She is not in acute distress.    Appearance: She is well-developed. She is not diaphoretic.  HENT:     Head: Normocephalic and atraumatic.  Eyes:     Conjunctiva/sclera: Conjunctivae normal.  Cardiovascular:     Rate and Rhythm: Normal rate and regular rhythm.     Heart sounds: Normal heart sounds. No murmur heard.    No friction rub. No gallop.  Pulmonary:     Effort: Pulmonary effort is normal. No respiratory distress.     Breath sounds: Normal breath sounds. No wheezing or rales.  Abdominal:     General: There is no distension.     Palpations: Abdomen is soft.     Tenderness: There is abdominal tenderness (suprapubic, LLQ). There is no guarding.  Musculoskeletal:        General: Tenderness (lower back) present.     Cervical back: Normal range of motion.  Skin:    General: Skin is warm and dry.     Findings: No erythema or rash.  Neurological:  Mental Status: She is alert and oriented to person, place, and time.     ED Results / Procedures / Treatments   Labs (all labs ordered are listed, but only abnormal results are displayed) Labs Reviewed  COMPREHENSIVE METABOLIC PANEL - Abnormal; Notable for the following components:      Result Value   Glucose, Bld 110 (*)    All other components within normal limits  URINALYSIS, W/ REFLEX TO CULTURE (INFECTION SUSPECTED) - Abnormal; Notable for the following components:   Color, Urine COLORLESS (*)    Specific Gravity, Urine <1.005 (*)    Bacteria, UA RARE (*)    All other components within normal limits  RESP PANEL BY RT-PCR (RSV, FLU A&B, COVID)  [id number]  LACTIC ACID, PLASMA  CBC WITH DIFFERENTIAL/PLATELET     EKG None  Radiology CT L-SPINE NO CHARGE Result Date: 09/13/2023 CLINICAL DATA:  Back pain EXAM: CT LUMBAR SPINE WITHOUT CONTRAST TECHNIQUE: Multidetector CT imaging of the lumbar spine was performed without intravenous contrast administration. Multiplanar CT image reconstructions were also generated. RADIATION DOSE REDUCTION: This exam was performed according to the departmental dose-optimization program which includes automated exposure control, adjustment of the mA and/or kV according to patient size and/or use of iterative reconstruction technique. COMPARISON:  Same-day CT abdomen and pelvis FINDINGS: Segmentation: 5 lumbar type vertebrae. Alignment: Grade 1 anterolisthesis of L5 on S1 Vertebrae: No acute fracture or focal pathologic process. Paraspinal and other soft tissues: Aortic atherosclerotic calcifications. Disc levels: No CT evidence high-grade spinal canal stenosis IMPRESSION: No acute fracture or traumatic listhesis. No CT finding to explain back pain Aortic Atherosclerosis (ICD10-I70.0). Electronically Signed   By: Lyndall Gore M.D.   On: 09/13/2023 12:26   CT ABDOMEN PELVIS WO CONTRAST Result Date: 09/13/2023 CLINICAL DATA:  Patient reports bladder infection 2 weeks ago. Now complains of fever and bilateral flank pain. EXAM: CT ABDOMEN AND PELVIS WITHOUT CONTRAST TECHNIQUE: Multidetector CT imaging of the abdomen and pelvis was performed following the standard protocol without IV contrast. RADIATION DOSE REDUCTION: This exam was performed according to the departmental dose-optimization program which includes automated exposure control, adjustment of the mA and/or kV according to patient size and/or use of iterative reconstruction technique. COMPARISON:  None Available. FINDINGS: Lower chest: Scar versus subsegmental atelectasis within the anterior right lower lobe. No pleural fluid or consolidative change. Hepatobiliary: No focal liver abnormality is seen. No gallstones, gallbladder  wall thickening, or biliary dilatation. Pancreas: Unremarkable. No pancreatic ductal dilatation or surrounding inflammatory changes. Spleen: Normal in size without focal abnormality. Adrenals/Urinary Tract: Normal adrenal glands. Hyperdense nodule off the posterior cortex of the upper pole of left kidney measures 75 Hounsfield units and 9 mm. This is compatible with a benign hemorrhagic cyst (Bosniak class 2). No follow-up imaging recommended. No signs of nephrolithiasis or hydronephrosis bilaterally. The urinary bladder appears normal. Stomach/Bowel: Stomach is nondistended. There is no pathologic dilatation of the large or small bowel loops. Patient is status post appendectomy. There is no bowel wall thickening, inflammation, or distension. Vascular/Lymphatic: Aortic atherosclerosis without aneurysm. No signs of abdominopelvic adenopathy. Reproductive: Uterus and bilateral adnexa are unremarkable. Other: No free fluid or fluid collections. Musculoskeletal: No acute abnormality. Facet arthropathy identified within the lower lumbar spine. No suspicious osseous lesions identified. IMPRESSION: 1. No acute findings within the abdomen or pelvis. 2. No signs of nephrolithiasis or hydronephrosis bilaterally. 3. Hyperdense nodule off the posterior cortex of the upper pole of left kidney measures 75 Hounsfield units and 9 mm. This is  compatible with a benign Bosniak class 2 cyst. No follow-up imaging recommended. 4.  Aortic Atherosclerosis (ICD10-I70.0). Electronically Signed   By: Waddell Calk M.D.   On: 09/13/2023 12:08   DG Chest 2 View Result Date: 09/13/2023 CLINICAL DATA:  73 year old female with history of fever and bilateral flank pain. EXAM: CHEST - 2 VIEW COMPARISON:  Chest x-ray 11/07/2008. FINDINGS: Lung volumes are normal. Elevation of right hemidiaphragm. No consolidative airspace disease. No pleural effusions. No pneumothorax. No pulmonary nodule or mass noted. Pulmonary vasculature and the  cardiomediastinal silhouette are within normal limits. IMPRESSION: 1.  No radiographic evidence of acute cardiopulmonary disease. 2. Elevation of the right hemidiaphragm more conspicuous than prior studies. Electronically Signed   By: Toribio Aye M.D.   On: 09/13/2023 10:45    Procedures Procedures    Medications Ordered in ED Medications - No data to display  ED Course/ Medical Decision Making/ A&P                                  72yo female with history of hypertension, hypothyroidism, presents with concern for back pain and elevated temperature 100.2 at home.  DDx includes pyelonephritis, epidural abscess, osteomyelitis, PE, pneumonia, diverticulitis, nephrolithiasis, viral synrome.  Labs completed and personally evaluated and interpreted by me show no sign of UTI, negative covid/flu/rsv, no clinically significant electrolyte abnormalities, no transaminitis, no leukocytosis or anemia.  CXR without signs of pneumonia.  CT abdomen pelvis obtained given abdominal tenderness on eam shows no acute abnormalities, no findings on CT to explain back pain.  Given no fever in ED, no leukocytosis, no risk factors for epidural abscess and at this time do not feel transfer for MRI indicated with low clinical suspicion for epidural abscess/osteomyelitis.  No chest pain or dyspnea to suggest PE.  Was lifting and putting away ecorations yesterday and suspect back pain is musculoskeletal as it is also worse with palpation and movements. Unclear source of elevated temperature at home, possible other viral syndrome. Recommend continued supportive care, PCP follow up, return if worsening. Patient discharged in stable condition with understanding of reasons to return.            Final Clinical Impression(s) / ED Diagnoses Final diagnoses:  Acute bilateral low back pain without sciatica  Lower abdominal pain  Fever, unspecified fever cause    Rx / DC Orders ED Discharge Orders     None          Dreama Longs, MD 09/14/23 [id number]

## 2023-09-13 NOTE — ED Triage Notes (Signed)
 She tells me that I had a bladder infection two weeks ago that was ostensibly cured by the taking of Macrobid . She is here today with c/o fever and bilat. Flank pain which began at ~ 0400 today.Temperature at home 100.5. she is ambulatory and in no distress.

## 2023-09-13 NOTE — ED Notes (Signed)
 Patient ambulated to restroom.

## 2023-10-19 ENCOUNTER — Other Ambulatory Visit: Payer: Self-pay | Admitting: Family Medicine

## 2023-10-19 DIAGNOSIS — E785 Hyperlipidemia, unspecified: Secondary | ICD-10-CM

## 2023-11-09 DIAGNOSIS — R29890 Loss of height: Secondary | ICD-10-CM | POA: Diagnosis not present

## 2023-11-09 DIAGNOSIS — Z1231 Encounter for screening mammogram for malignant neoplasm of breast: Secondary | ICD-10-CM | POA: Diagnosis not present

## 2023-11-09 DIAGNOSIS — M8588 Other specified disorders of bone density and structure, other site: Secondary | ICD-10-CM | POA: Diagnosis not present

## 2023-11-09 LAB — HM DEXA SCAN

## 2023-11-09 LAB — HM MAMMOGRAPHY

## 2023-11-11 ENCOUNTER — Encounter: Payer: Self-pay | Admitting: Family Medicine

## 2023-11-25 ENCOUNTER — Ambulatory Visit (INDEPENDENT_AMBULATORY_CARE_PROVIDER_SITE_OTHER): Payer: PPO | Admitting: Family Medicine

## 2023-11-25 ENCOUNTER — Encounter: Payer: Self-pay | Admitting: Family Medicine

## 2023-11-25 VITALS — BP 94/62 | HR 61 | Temp 98.2°F | Wt 135.5 lb

## 2023-11-25 DIAGNOSIS — E039 Hypothyroidism, unspecified: Secondary | ICD-10-CM | POA: Diagnosis not present

## 2023-11-25 DIAGNOSIS — I10 Essential (primary) hypertension: Secondary | ICD-10-CM | POA: Diagnosis not present

## 2023-11-25 DIAGNOSIS — Z1159 Encounter for screening for other viral diseases: Secondary | ICD-10-CM

## 2023-11-25 DIAGNOSIS — E785 Hyperlipidemia, unspecified: Secondary | ICD-10-CM

## 2023-11-25 DIAGNOSIS — R300 Dysuria: Secondary | ICD-10-CM

## 2023-11-25 LAB — LIPID PANEL
Cholesterol: 158 mg/dL (ref 0–200)
HDL: 47.1 mg/dL (ref >39.00)
LDL Cholesterol: 92 mg/dL (ref 0–99)
NonHDL: 110.46
Total CHOL/HDL Ratio: 3
Triglycerides: 94 mg/dL (ref 0.0–149.0)
VLDL: 18.8 mg/dL (ref 0.0–40.0)

## 2023-11-25 LAB — CBC WITH DIFFERENTIAL/PLATELET
Basophils Absolute: 0.1 10*3/uL (ref 0.0–0.1)
Basophils Relative: 0.9 % (ref 0.0–3.0)
Eosinophils Absolute: 0.1 10*3/uL (ref 0.0–0.7)
Eosinophils Relative: 1.6 % (ref 0.0–5.0)
HCT: 42.8 % (ref 36.0–46.0)
Hemoglobin: 14.3 g/dL (ref 12.0–15.0)
Lymphocytes Relative: 18.8 % (ref 12.0–46.0)
Lymphs Abs: 1.4 10*3/uL (ref 0.7–4.0)
MCHC: 33.4 g/dL (ref 30.0–36.0)
MCV: 94 fl (ref 78.0–100.0)
Monocytes Absolute: 0.6 10*3/uL (ref 0.1–1.0)
Monocytes Relative: 7.9 % (ref 3.0–12.0)
Neutro Abs: 5.2 10*3/uL (ref 1.4–7.7)
Neutrophils Relative %: 70.8 % (ref 43.0–77.0)
Platelets: 367 10*3/uL (ref 150.0–400.0)
RBC: 4.55 Mil/uL (ref 3.87–5.11)
RDW: 14.4 % (ref 11.5–15.5)
WBC: 7.4 10*3/uL (ref 4.0–10.5)

## 2023-11-25 LAB — POCT URINALYSIS DIPSTICK
Bilirubin, UA: NEGATIVE
Glucose, UA: NEGATIVE
Ketones, UA: NEGATIVE
Nitrite, UA: NEGATIVE
Protein, UA: POSITIVE — AB
Spec Grav, UA: 1.015 (ref 1.010–1.025)
Urobilinogen, UA: 0.2 U/dL
pH, UA: 6 (ref 5.0–8.0)

## 2023-11-25 LAB — HEPATIC FUNCTION PANEL
ALT: 18 U/L (ref 0–35)
AST: 20 U/L (ref 0–37)
Albumin: 4.1 g/dL (ref 3.5–5.2)
Alkaline Phosphatase: 57 U/L (ref 39–117)
Bilirubin, Direct: 0.1 mg/dL (ref 0.0–0.3)
Total Bilirubin: 0.5 mg/dL (ref 0.2–1.2)
Total Protein: 7.2 g/dL (ref 6.0–8.3)

## 2023-11-25 LAB — BASIC METABOLIC PANEL
BUN: 15 mg/dL (ref 6–23)
CO2: 31 meq/L (ref 19–32)
Calcium: 10 mg/dL (ref 8.4–10.5)
Chloride: 104 meq/L (ref 96–112)
Creatinine, Ser: 1.05 mg/dL (ref 0.40–1.20)
GFR: 53.26 mL/min — ABNORMAL LOW (ref >60.00)
Glucose, Bld: 95 mg/dL (ref 70–99)
Potassium: 5 meq/L (ref 3.5–5.1)
Sodium: 140 meq/L (ref 135–145)

## 2023-11-25 LAB — TSH: TSH: 2.11 u[IU]/mL (ref 0.35–5.50)

## 2023-11-25 MED ORDER — CIPROFLOXACIN HCL 500 MG PO TABS
500.0000 mg | ORAL_TABLET | Freq: Two times a day (BID) | ORAL | 0 refills | Status: AC
Start: 2023-11-25 — End: 2023-11-28

## 2023-11-25 NOTE — Patient Instructions (Addendum)
 Schedule your complete physical in 6 months We'll notify you of your lab results and make any changes if needed Keep up the good work on healthy diet and regular exercise- you look great! START the Cipro twice daily for your UTI Call with any questions or concerns Stay Safe!  Stay Healthy! HAPPY BELATED BIRTHDAY!!!

## 2023-11-25 NOTE — Progress Notes (Signed)
   Subjective:    Patient ID: Sylvia Stevens, female    DOB: [birth year], 73 y.o.   MRN: [id number]  HPI HTN- chronic problem, on Lisinopril 20mg  daily.  Excellent control today.  Denies CP, SOB, HA's, visual changes, edema.  Hyperlipidemia- chronic problem, on Zetia 10mg  daily.  Denies abd pain, N/V.  Hypothyroid- chronic problem, on Levothyroxine daily.  Denies changes to skin/hair/nails.  Dysuria- sxs started ~10 days ago.  + urgency, nausea, suprapubic pain.  Pain level 7-8/10.  She has PCN allergy.  Bactrim caused nausea.   Review of Systems For ROS see HPI     Objective:   Physical Exam Vitals reviewed.  Constitutional:      General: She is not in acute distress.    Appearance: Normal appearance. She is well-developed. She is not ill-appearing.  HENT:     Head: Normocephalic and atraumatic.  Eyes:     Conjunctiva/sclera: Conjunctivae normal.     Pupils: Pupils are equal, round, and reactive to light.  Neck:     Thyroid: No thyromegaly.  Cardiovascular:     Rate and Rhythm: Normal rate and regular rhythm.     Heart sounds: Normal heart sounds. No murmur heard. Pulmonary:     Effort: Pulmonary effort is normal. No respiratory distress.     Breath sounds: Normal breath sounds.  Abdominal:     General: There is no distension.     Palpations: Abdomen is soft.     Tenderness: There is abdominal tenderness (suprapubic TTP). There is no right CVA tenderness or left CVA tenderness.  Musculoskeletal:     Cervical back: Normal range of motion and neck supple.  Lymphadenopathy:     Cervical: No cervical adenopathy.  Skin:    General: Skin is warm and dry.  Neurological:     General: No focal deficit present.     Mental Status: She is alert and oriented to person, place, and time.  Psychiatric:        Mood and Affect: Mood normal.        Behavior: Behavior normal.           Assessment & Plan:  Dysuria- new.  Pt's sxs are consistent w/ UTI and UA is suspicious for  infxn.  Due to multiple allergies will start Cipro.  Reviewed supportive care and red flags that should prompt return.  Pt expressed understanding and is in agreement w/ plan.

## 2023-11-25 NOTE — Assessment & Plan Note (Signed)
 Chronic problem, on Levothyroxine on daily w/o difficulty.  Check labs.  Adjust meds prn

## 2023-11-25 NOTE — Assessment & Plan Note (Signed)
 Chronic problem, on Zetia 10mg  daily w/o difficulty.  Check labs.  Adjust meds prn

## 2023-11-25 NOTE — Assessment & Plan Note (Signed)
 Chronic problem, on Lisinopril 20mg  daily w/ excellent control.  Currently asymptomatic.  Check labs due to ACE use but no anticipated med changes.

## 2023-11-26 ENCOUNTER — Encounter: Payer: Self-pay | Admitting: Family Medicine

## 2023-11-27 LAB — URINE CULTURE
MICRO NUMBER:: 16221065
SPECIMEN QUALITY:: ADEQUATE

## 2023-11-27 LAB — HEPATITIS C ANTIBODY: Hepatitis C Ab: NONREACTIVE

## 2023-12-15 ENCOUNTER — Other Ambulatory Visit: Payer: Self-pay | Admitting: Family Medicine

## 2023-12-15 DIAGNOSIS — I10 Essential (primary) hypertension: Secondary | ICD-10-CM

## 2024-01-11 ENCOUNTER — Encounter: Payer: Self-pay | Admitting: Internal Medicine

## 2024-01-11 ENCOUNTER — Ambulatory Visit: Payer: PPO | Admitting: Internal Medicine

## 2024-01-11 VITALS — BP 116/60 | HR 86 | Ht 62.0 in | Wt 136.6 lb

## 2024-01-11 DIAGNOSIS — E039 Hypothyroidism, unspecified: Secondary | ICD-10-CM

## 2024-01-11 DIAGNOSIS — E559 Vitamin D deficiency, unspecified: Secondary | ICD-10-CM | POA: Diagnosis not present

## 2024-01-11 DIAGNOSIS — E538 Deficiency of other specified B group vitamins: Secondary | ICD-10-CM

## 2024-01-11 MED ORDER — LEVOTHYROXINE SODIUM 88 MCG PO TABS: 88.0000 ug | ORAL_TABLET | Freq: Every day | ORAL | 3 refills | Status: AC

## 2024-01-11 NOTE — Patient Instructions (Signed)
 Please continue Levothyroxine  88 mcg daily.  Take the thyroid  hormone every day, with water, at least 30 minutes before breakfast, separated by at least 4 hours from: - acid reflux medications - calcium  - iron - multivitamins  Please continue: - Vitamin D  2000 units daily - Vitamin B12 1000 mcg every other day  Please return in 1 year.

## 2024-01-11 NOTE — Progress Notes (Signed)
 Subjective:     Patient ID: Sylvia Stevens, female   DOB: 01/14/1951, 73 y.o.   MRN: [id number]  HPI [name] is a 73 y.o. pleasant woman, recently for f/u for well controlled hypothyroidism, dx 2008, and also vitamin B12 and D deficiencies. Last visit was 1 year ago.  Interim history: She denies any sxs today other than bilateral knee pain.  She is undecided about a TKR. No steroid injections (prev. SE - facial rash). She has fatigue, constipation. She has hair loss, which is not new.  She has very thick, curly, hair.   She also has longstanding vertigo.  She has approximately 2 falls a year, without injuries.  She is trying to be very aware of how she walks and especially when turning.  She does not use a cane.  She has well-controlled hypothyroidism, on stable dose of levothyroxine .  Pt is on levothyroxine  88 mcg daily, taken: - in am - fasting - at least 30 min from b'fast - no Ca, Fe, MVI, + occasionally PPIs later in the day (very seldom) - not on Biotin - takes vitamin D , B12.  Reviewed TFTs: Lab Results  Component Value Date   TSH 2.11 11/25/2023   TSH 0.50 05/28/2023   TSH 0.53 01/28/2023   TSH 0.49 05/26/2022   TSH 1.71 01/24/2022   TSH 3.08 08/27/2021   TSH 3.73 01/18/2021   TSH 1.18 04/23/2020   TSH 1.20 01/19/2020   TSH 1.18 04/22/2019   FREET4 1.33 01/28/2023   FREET4 1.21 01/24/2022   FREET4 1.03 01/18/2021   FREET4 1.36 01/19/2020   FREET4 1.07 01/13/2018   FREET4 1.15 12/25/2016   FREET4 1.13 12/26/2015   FREET4 0.98 12/22/2014   FREET4 1.06 12/01/2013   FREET4 1.11 06/03/2013   No FH of thyroid  ds. No FH of thyroid  cancer. No h/o radiation tx to head or neck. No herbal supplements. No Biotin use. No recent steroids use.   She also has a history of HTN, osteopenia, osteoarthritis, bursitis.   Low B12: We checked her vitamin levels due to her complaints of fatigue.  A B12 level was found to be low in the normal range so we started her on  supplementation.  She was initially on 2500 mcg daily, but we had to decrease the dose to 1000 mcg daily and then 1000 mcg every other day.  She continues on this dose.  On this dose, latest vitamin B-12 was normal: Lab Results  Component Value Date   VITAMINB12 805 05/26/2022   VITAMINB12 >1504 (H) 01/24/2022   VITAMINB12 1,002 (H) 01/18/2021   VITAMINB12 >1526 (H) 04/23/2020   VITAMINB12 1,308 (H) 01/19/2020   VITAMINB12 >1500 (H) 04/22/2019   VITAMINB12 >1500 (H) 01/13/2018   VITAMINB12 >1500 (H) 04/28/2017   VITAMINB12 214 12/25/2016   Vitamin D  insufficiency: She takes 2000 units vitamin D  daily, dose doubled in 01/2020.  Currently taking 4000 units vitamin D  daily.  Latest vitamin D  level was normal: Lab Results  Component Value Date   VD25OH 58.41 05/28/2023   VD25OH 50.22 05/26/2022   VD25OH 55.24 01/24/2022   VD25OH 41.25 01/18/2021   VD25OH 44.24 04/23/2020   VD25OH 22.4 (L) 01/19/2020   VD25OH 38.77 04/22/2019   VD25OH 41.98 01/13/2018   VD25OH 45.14 04/28/2017   VD25OH 37.63 04/25/2016    She is also taking fish oil - occasionally.  Her husband has breast cancer. He had RxTx. He also has DM.   Review of Systems + see HPI  I  reviewed pt's medications, allergies, PMH, social hx, family hx, and changes were documented in the history of present illness. Otherwise, unchanged from my initial visit note.  Past Medical History:  Diagnosis Date   Arthritis    Endometrial polyp    Hypertension    Osteopenia 09/16/2012   T score -1.3 FRAX 7.6%/0.9%   Thyroid  disease    hypothyroid   Past Surgical History:  Procedure Laterality Date   APPENDECTOMY     Bladder Bx     CARPAL TUNNEL RELEASE     X 2   CESAREAN SECTION     X 2   HERNIA REPAIR     TOE SURGERY     TUBAL LIGATION     Social History   Socioeconomic History   Marital status: Married    Spouse name: Not on file   Number of children: 2   Years of education: Not on file   Highest education  level: Some college, no degree  Occupational History   Occupation: retired  Tobacco Use   Smoking status: Every Day    Current packs/day: 0.50    Types: Cigarettes   Smokeless tobacco: Never  Vaping Use   Vaping status: Never Used  Substance and Sexual Activity   Alcohol use: Yes    Alcohol/week: 0.0 standard drinks of alcohol    Comment: rare   Drug use: No   Sexual activity: Never    Birth control/protection: Surgical  Other Topics Concern   Not on file  Social History Narrative   Lives with husband in a one story home.  Has 2 children.  Works part time as an Gaffer.  Education: some college.   Social Drivers of Corporate investment banker Strain: Low Risk  (11/24/2023)   Overall Financial Resource Strain (CARDIA)    Difficulty of Paying Living Expenses: Not hard at all  Food Insecurity: No Food Insecurity (11/24/2023)   Hunger Vital Sign    Worried About Running Out of Food in the Last Year: Never true    Ran Out of Food in the Last Year: Never true  Transportation Needs: No Transportation Needs (11/24/2023)   PRAPARE - Administrator, Civil Service (Medical): No    Lack of Transportation (Non-Medical): No  Physical Activity: Insufficiently Active (11/24/2023)   Exercise Vital Sign    Days of Exercise per Week: 3 days    Minutes of Exercise per Session: 30 min  Stress: Stress Concern Present (11/24/2023)   [employer] of Occupational Health - Occupational Stress Questionnaire    Feeling of Stress : To some extent  Social Connections: Moderately Integrated (11/24/2023)   Social Connection and Isolation Panel [NHANES]    Frequency of Communication with Friends and Family: Once a week    Frequency of Social Gatherings with Friends and Family: Once a week    Attends Religious Services: More than 4 times per year    Active Member of [employer] or Organizations: Yes    Attends Engineer, structural: More than 4 times per year    Marital Status:  Married  Catering manager Violence: Not At Risk (04/29/2023)   Humiliation, Afraid, Rape, and Kick questionnaire    Fear of Current or Ex-Partner: No    Emotionally Abused: No    Physically Abused: No    Sexually Abused: No   Current Outpatient Medications on File Prior to Visit  Medication Sig Dispense Refill   aspirin 81 MG tablet Take 81 mg  by mouth daily.     cholecalciferol (VITAMIN D ) 1000 UNITS tablet Take 2,000 Units by mouth daily.      COVID-19 mRNA vaccine 2023-2024 (COMIRNATY ) SUSP injection Inject into the muscle. 0.3 mL 0   cyanocobalamin  1000 MCG tablet Take 1,000 mcg by mouth daily.     ezetimibe  (ZETIA ) 10 MG tablet TAKE 1 TABLET(10 MG) BY MOUTH DAILY 90 tablet 0   levothyroxine  (SYNTHROID ) 88 MCG tablet Take 1 tablet (88 mcg total) by mouth daily before breakfast. 90 tablet 3   lisinopril  (ZESTRIL ) 20 MG tablet TAKE 1 TABLET(20 MG) BY MOUTH DAILY 30 tablet 3   No current facility-administered medications on file prior to visit.   Allergies  Allergen Reactions   Atorvastatin  Hives   Lobster [Shellfish Allergy] Hives   Penicillins Hives   Strawberry Extract Hives   Erythromycin Other (See Comments)    Face feels like it is sunburned.    Flexeril  [Cyclobenzaprine ]     Vomit    Omnipaque [Iohexol]    Family History  Problem Relation Age of Onset   Alzheimer's disease Mother    Heart disease Father    Pancreatic cancer Father    Arthritis Father    Hyperlipidemia Father    Hypertension Father    Cancer Paternal Aunt        Ovarian or Uterine cancer   Aneurysm Paternal Uncle    Colon cancer Neg Hx    Rectal cancer Neg Hx    Stomach cancer Neg Hx    Esophageal cancer Neg Hx    Objective:   Physical Exam BP 116/60   Pulse 86   Ht 5\' 2"  (1.575 m)   Wt 136 lb 9.6 oz (62 kg)   SpO2 97%   BMI 24.98 kg/m   Wt Readings from Last 3 Encounters:  01/11/24 136 lb 9.6 oz (62 kg)  11/25/23 135 lb 8 oz (61.5 kg)  05/28/23 136 lb 12.8 oz (62.1 kg)    Constitutional: normal weight, in NAD Eyes:  EOMI, no exophthalmos ENT: no neck masses, no cervical lymphadenopathy Cardiovascular: RRR, No MRG Respiratory: CTA B Musculoskeletal: no deformities Skin:no rashes Neurological: no tremor with outstretched hands  Assessment:     1. Hypothyroidism  - on  stable  dose of levothyroxine  generic since diagnosis  2.  Low vitamin B12  3.  Vitamin D  deficiency     Plan:     1. Patient with long history of controlled hypothyroidism, on generic levothyroxine  - latest thyroid  labs reviewed with pt. >> normal: Lab Results  Component Value Date   TSH 2.11 11/25/2023  - she continues on LT4 88 mcg daily - pt feels good on this dose with the exception of fatigue and constipation, which are not new. - we discussed about taking the thyroid  hormone every day, with water, >30 minutes before breakfast, separated by >4 hours from acid reflux medications, calcium , iron, multivitamins. Pt. is taking it correctly. - at today's visit I refilled her LT4.  2.  Low vitamin B12 -At last visit I suggested to switch from taking the B12 supplement 1000 mcg daily to every other day as the B12 level was undetectably high - She continues on over-the-counter B12 1000 mcg every other day - latest level was normal: Lab Results  Component Value Date   VITAMINB12 805 05/26/2022   VITAMINB12 >1504 (H) 01/24/2022  - Will continue the above dose  3.  Vitamin D  deficiency  - She is on 4000 units over-the-counter  vitamin D3 - Latest level was normal in 05/2022: Lab Results  Component Value Date   VD25OH 58.41 05/28/2023   VD25OH 50.22 05/26/2022  - Will continue the above dose  Requested Prescriptions   Signed Prescriptions Disp Refills   levothyroxine  (SYNTHROID ) 88 MCG tablet 90 tablet 3    Sig: Take 1 tablet (88 mcg total) by mouth daily before breakfast.   Emilie Harden, MD PhD [facility] Endocrin[facility]

## 2024-01-13 ENCOUNTER — Other Ambulatory Visit: Payer: Self-pay | Admitting: Family Medicine

## 2024-01-13 DIAGNOSIS — E785 Hyperlipidemia, unspecified: Secondary | ICD-10-CM

## 2024-03-13 ENCOUNTER — Other Ambulatory Visit: Payer: Self-pay | Admitting: Family Medicine

## 2024-03-13 DIAGNOSIS — I10 Essential (primary) hypertension: Secondary | ICD-10-CM

## 2024-04-10 ENCOUNTER — Other Ambulatory Visit: Payer: Self-pay | Admitting: Family Medicine

## 2024-04-10 DIAGNOSIS — E785 Hyperlipidemia, unspecified: Secondary | ICD-10-CM

## 2024-05-24 ENCOUNTER — Encounter: Payer: Self-pay | Admitting: Family Medicine

## 2024-05-30 ENCOUNTER — Ambulatory Visit (INDEPENDENT_AMBULATORY_CARE_PROVIDER_SITE_OTHER): Payer: PPO | Admitting: Family Medicine

## 2024-05-30 ENCOUNTER — Encounter: Payer: Self-pay | Admitting: Family Medicine

## 2024-05-30 VITALS — BP 104/68 | HR 63 | Temp 98.1°F | Resp 16 | Ht 62.0 in | Wt 138.4 lb

## 2024-05-30 DIAGNOSIS — E538 Deficiency of other specified B group vitamins: Secondary | ICD-10-CM

## 2024-05-30 DIAGNOSIS — I10 Essential (primary) hypertension: Secondary | ICD-10-CM

## 2024-05-30 DIAGNOSIS — E559 Vitamin D deficiency, unspecified: Secondary | ICD-10-CM | POA: Diagnosis not present

## 2024-05-30 DIAGNOSIS — Z23 Encounter for immunization: Secondary | ICD-10-CM

## 2024-05-30 DIAGNOSIS — Z0000 Encounter for general adult medical examination without abnormal findings: Secondary | ICD-10-CM

## 2024-05-30 LAB — HEPATIC FUNCTION PANEL
ALT: 18 U/L (ref 0–35)
AST: 20 U/L (ref 0–37)
Albumin: 4.2 g/dL (ref 3.5–5.2)
Alkaline Phosphatase: 56 U/L (ref 39–117)
Bilirubin, Direct: 0.1 mg/dL (ref 0.0–0.3)
Total Bilirubin: 0.5 mg/dL (ref 0.2–1.2)
Total Protein: 6.6 g/dL (ref 6.0–8.3)

## 2024-05-30 LAB — LIPID PANEL
Cholesterol: 177 mg/dL (ref 0–200)
HDL: 49.7 mg/dL (ref >39.00)
LDL Cholesterol: 112 mg/dL — ABNORMAL HIGH (ref 0–99)
NonHDL: 127.53
Total CHOL/HDL Ratio: 4
Triglycerides: 79 mg/dL (ref 0.0–149.0)
VLDL: 15.8 mg/dL (ref 0.0–40.0)

## 2024-05-30 LAB — CBC WITH DIFFERENTIAL/PLATELET
Basophils Absolute: 0.1 K/uL (ref 0.0–0.1)
Basophils Relative: 1 % (ref 0.0–3.0)
Eosinophils Absolute: 0.2 K/uL (ref 0.0–0.7)
Eosinophils Relative: 2.5 % (ref 0.0–5.0)
HCT: 40.5 % (ref 36.0–46.0)
Hemoglobin: 13.6 g/dL (ref 12.0–15.0)
Lymphocytes Relative: 28.8 % (ref 12.0–46.0)
Lymphs Abs: 1.8 K/uL (ref 0.7–4.0)
MCHC: 33.7 g/dL (ref 30.0–36.0)
MCV: 92.8 fl (ref 78.0–100.0)
Monocytes Absolute: 0.4 K/uL (ref 0.1–1.0)
Monocytes Relative: 6.7 % (ref 3.0–12.0)
Neutro Abs: 3.8 K/uL (ref 1.4–7.7)
Neutrophils Relative %: 61 % (ref 43.0–77.0)
Platelets: 270 K/uL (ref 150.0–400.0)
RBC: 4.37 Mil/uL (ref 3.87–5.11)
RDW: 14 % (ref 11.5–15.5)
WBC: 6.2 K/uL (ref 4.0–10.5)

## 2024-05-30 LAB — BASIC METABOLIC PANEL WITH GFR
BUN: 17 mg/dL (ref 6–23)
CO2: 29 meq/L (ref 19–32)
Calcium: 9.8 mg/dL (ref 8.4–10.5)
Chloride: 104 meq/L (ref 96–112)
Creatinine, Ser: 1.02 mg/dL (ref 0.40–1.20)
GFR: 54.95 mL/min — ABNORMAL LOW (ref >60.00)
Glucose, Bld: 94 mg/dL (ref 70–99)
Potassium: 4.5 meq/L (ref 3.5–5.1)
Sodium: 140 meq/L (ref 135–145)

## 2024-05-30 LAB — TSH: TSH: 0.39 u[IU]/mL (ref 0.35–5.50)

## 2024-05-30 LAB — B12 AND FOLATE PANEL
Folate: 11.5 ng/mL (ref >5.9)
Vitamin B-12: 1230 pg/mL — ABNORMAL HIGH (ref 211–911)

## 2024-05-30 LAB — VITAMIN D 25 HYDROXY (VIT D DEFICIENCY, FRACTURES): VITD: 48.43 ng/mL (ref 30.00–100.00)

## 2024-05-30 NOTE — Patient Instructions (Signed)
Follow up in 6 months to recheck blood pressure and cholesterol We'll notify you of your lab results and make any changes if needed Keep up the good work on healthy diet and regular exercise- you look great! Call with any questions or concerns Stay Safe!  Stay Healthy! Happy Fall!!

## 2024-05-30 NOTE — Assessment & Plan Note (Signed)
 Pt's PE unchanged from previous and WNL w/ exception of difficulty getting up on exam table.  UTD on mammo, colonoscopy, DEXA, Tdap, PNA.  Flu shot given today.  Check labs.  Anticipatory guidance provided.

## 2024-05-30 NOTE — Progress Notes (Signed)
   Subjective:    Patient ID: Sylvia Stevens, female    DOB: January 02, 1951, 73 y.o.   MRN: [id number]  HPI CPE- UTD on mammo, DEXA, Tdap, PNA, colonoscopy  Patient Care Team    Relationship Specialty Notifications Start End  Mahlon Comer BRAVO, MD PCP - General Family Medicine  11/28/11   Bonner Ade, MD Consulting Physician Physical Medicine and Rehabilitation  04/07/14   Trixie File, MD Consulting Physician Internal Medicine  03/29/15   Luis Purchase, MD Consulting Physician Gastroenterology  03/29/15      Health Maintenance  Topic Date Due   Influenza Vaccine  04/08/2024   Medicare Annual Wellness (AWV)  04/28/2024   COVID-19 Vaccine (8 - 2025-26 season) 05/09/2024   Zoster Vaccines- Shingrix (1 of 2) 08/29/2024 (Originally 11/13/2001)   Mammogram  11/08/2024   DEXA SCAN  11/08/2025   DTaP/Tdap/Td (3 - Td or Tdap) 07/23/2028   Pneumococcal Vaccine: 50+ Years  Completed   Hepatitis C Screening  Completed   HPV VACCINES  Aged Out   Meningococcal B Vaccine  Aged Out   Colonoscopy  Discontinued      Review of Systems Patient reports no vision/ hearing changes, adenopathy,fever, weight change, swallowing issues, chest pain, palpitations, edema, persistant/recurrent cough, hemoptysis, dyspnea (rest/exertional/paroxysmal nocturnal), gastrointestinal bleeding (melena, rectal bleeding), abdominal pain, significant heartburn, bowel changes, GU symptoms (dysuria, hematuria, incontinence), Gyn symptoms (abnormal  bleeding, pain),  syncope, focal weakness, memory loss, numbness & tingling, skin/hair/nail changes, abnormal bruising or bleeding, anxiety, or depression.   + hoarseness since thyroid  surgery    Objective:   Physical Exam General Appearance:    Alert, cooperative, no distress, appears stated age  Head:    Normocephalic, without obvious abnormality, atraumatic  Eyes:    PERRL, conjunctiva/corneas clear, EOM's intact both eyes  Ears:    Normal TM's and external ear canals, both  ears  Nose:   Nares normal, septum midline, mucosa normal, no drainage    or sinus tenderness  Throat:   Lips, mucosa, and tongue normal; teeth and gums normal  Neck:   Supple, symmetrical, trachea midline, no adenopathy;    Thyroid : no enlargement/tenderness/nodules  Back:     Symmetric, no curvature, ROM normal, no CVA tenderness  Lungs:     Clear to auscultation bilaterally, respirations unlabored  Chest Wall:    No tenderness or deformity   Heart:    Regular rate and rhythm, S1 and S2 normal, no murmur, rub   or gallop  Breast Exam:    Deferred to mammo  Abdomen:     Soft, non-tender, bowel sounds active all four quadrants,    no masses, no organomegaly  Genitalia:    Deferred  Rectal:    Extremities:   Extremities normal, atraumatic, no cyanosis or edema  Pulses:   2+ and symmetric all extremities  Skin:   Skin color, texture, turgor normal, no rashes or lesions  Lymph nodes:   Cervical, supraclavicular, and axillary nodes normal  Neurologic:   CNII-XII intact, difficulty climbing up on exam table          Assessment & Plan:

## 2024-05-31 ENCOUNTER — Ambulatory Visit: Payer: Self-pay | Admitting: Family Medicine

## 2024-05-31 NOTE — Progress Notes (Signed)
 Patient reviewed labs and Dr. Charis message

## 2024-06-01 ENCOUNTER — Ambulatory Visit (INDEPENDENT_AMBULATORY_CARE_PROVIDER_SITE_OTHER): Payer: PPO

## 2024-06-01 VITALS — Ht 62.0 in | Wt 138.0 lb

## 2024-06-01 DIAGNOSIS — Z0000 Encounter for general adult medical examination without abnormal findings: Secondary | ICD-10-CM

## 2024-06-01 NOTE — Patient Instructions (Signed)
 Sylvia Stevens,  Thank you for taking the time for your Medicare Wellness Visit. I appreciate your continued commitment to your health goals. Please review the care plan we discussed, and feel free to reach out if I can assist you further.  Medicare recommends these wellness visits once per year to help you and your care team stay ahead of potential health issues. These visits are designed to focus on prevention, allowing your provider to concentrate on managing your acute and chronic conditions during your regular appointments.  Please note that Annual Wellness Visits do not include a physical exam. Some assessments may be limited, especially if the visit was conducted virtually. If needed, we may recommend a separate in-person follow-up with your provider.  Ongoing Care Seeing your primary care provider every 3 to 6 months helps us  monitor your health and provide consistent, personalized care. Last office visit on 05/30/2024.  Keep up the good work.  Referrals If a referral was made during today's visit and you haven't received any updates within two weeks, please contact the referred provider directly to check on the status.  Recommended Screenings:  Health Maintenance  Topic Date Due   COVID-19 Vaccine (8 - 2025-26 season) 05/09/2024   Zoster (Shingles) Vaccine (1 of 2) 08/29/2024*   Breast Cancer Screening  11/08/2024   Medicare Annual Wellness Visit  06/01/2025   DEXA scan (bone density measurement)  11/08/2025   DTaP/Tdap/Td vaccine (3 - Td or Tdap) 07/23/2028   Pneumococcal Vaccine for age over 82  Completed   Flu Shot  Completed   Hepatitis C Screening  Completed   HPV Vaccine  Aged Out   Meningitis B Vaccine  Aged Out   Colon Cancer Screening  Discontinued  *Topic was postponed. The date shown is not the original due date.       06/01/2024   10:51 AM  Advanced Directives  Does Patient Have a Medical Advance Directive? Yes  Type of Estate agent of  [place];Living will  Copy of Healthcare Power of Attorney in Chart? No - copy requested   Advance Care Planning is important because it: Ensures you receive medical care that aligns with your values, goals, and preferences. Provides guidance to your family and loved ones, reducing the emotional burden of decision-making during critical moments.  Vision: Annual vision screenings are recommended for early detection of glaucoma, cataracts, and diabetic retinopathy. These exams can also reveal signs of chronic conditions such as diabetes and high blood pressure.  Dental: Annual dental screenings help detect early signs of oral cancer, gum disease, and other conditions linked to overall health, including heart disease and diabetes.  Please see the attached documents for additional preventive care recommendations.

## 2024-06-01 NOTE — Progress Notes (Signed)
 Subjective:   Sylvia Stevens is a 73 y.o. who presents for a Medicare Wellness preventive visit.  As a reminder, Annual Wellness Visits don't include a physical exam, and some assessments may be limited, especially if this visit is performed virtually. We may recommend an in-person follow-up visit with your provider if needed.  Visit Complete: Virtual I connected with  Sylvia Stevens on 06/01/24 by a audio enabled telemedicine application and verified that I am speaking with the correct person using two identifiers.  Patient Location: Home  Provider Location: Home Office  I discussed the limitations of evaluation and management by telemedicine. The patient expressed understanding and agreed to proceed.  Vital Signs: Because this visit was a virtual/telehealth visit, some criteria may be missing or patient reported. Any vitals not documented were not able to be obtained and vitals that have been documented are patient reported.  VideoDeclined- This patient declined Librarian, academic. Therefore the visit was completed with audio only.  Persons Participating in Visit: Patient.  AWV Questionnaire: Yes: Patient Medicare AWV questionnaire was completed by the patient on 05/28/2024; I have confirmed that all information answered by patient is correct and no changes since this date.  Cardiac Risk Factors include: advanced age (>60men, >42 women);hypertension;dyslipidemia     Objective:    Today's Vitals   06/01/24 1048  Weight: 138 lb (62.6 kg)  Height: 5' 2 (1.575 m)   Body mass index is 25.24 kg/m.     06/01/2024   10:51 AM 09/13/2023    9:43 AM 04/29/2023    9:41 AM 05/20/2021    8:34 AM 04/10/2021   11:00 AM 04/23/2020    8:11 AM 07/06/2018    9:14 AM  Advanced Directives  Does Patient Have a Medical Advance Directive? Yes No Yes No Yes Yes Yes   Type of Estate agent of [place];Living will  Living will Living will Living will Living  will Living will  Does patient want to make changes to medical advance directive?     No - Patient declined    Copy of Healthcare Power of Attorney in Chart? No - copy requested        Would patient like information on creating a medical advance directive?  No - Patient declined  No - Patient declined        Data saved with a previous flowsheet row definition    Current Medications (verified) Outpatient Encounter Medications as of 06/01/2024  Medication Sig   aspirin 81 MG tablet Take 81 mg by mouth daily.   cholecalciferol (VITAMIN D ) 1000 UNITS tablet Take 2,000 Units by mouth daily.    cyanocobalamin  1000 MCG tablet Take 1,000 mcg by mouth daily.   ezetimibe  (ZETIA ) 10 MG tablet TAKE 1 TABLET(10 MG) BY MOUTH DAILY   levothyroxine  (SYNTHROID ) 88 MCG tablet Take 1 tablet (88 mcg total) by mouth daily before breakfast.   lisinopril  (ZESTRIL ) 20 MG tablet TAKE 1 TABLET(20 MG) BY MOUTH DAILY   No facility-administered encounter medications on Stevens as of 06/01/2024.    Allergies (verified) Atorvastatin , Lobster [shellfish allergy], Penicillins, Strawberry extract, Erythromycin, Flexeril  [cyclobenzaprine ], and Omnipaque [iohexol]   History: Past Medical History:  Diagnosis Date   Allergy 1957   hayfever, foods, medications   Arthritis    Depression 1995   resolved after 1 year   Endometrial polyp    Hypertension    Osteopenia 09/16/2012   T score -1.3 FRAX 7.6%/0.9%   Thyroid  disease  hypothyroid   Past Surgical History:  Procedure Laterality Date   APPENDECTOMY     Bladder Bx     CARPAL TUNNEL RELEASE     X 2   CESAREAN SECTION     X 2   HERNIA REPAIR     TOE SURGERY     TUBAL LIGATION     Family History  Problem Relation Age of Onset   Alzheimer's disease Mother    Miscarriages / Stillbirths Mother    Heart disease Father    Pancreatic cancer Father    Arthritis Father    Hyperlipidemia Father    Hypertension Father    Cancer Father    Cancer Paternal Aunt         Ovarian or Uterine cancer   Aneurysm Paternal Uncle    Colon cancer Neg Hx    Rectal cancer Neg Hx    Stomach cancer Neg Hx    Esophageal cancer Neg Hx    Social History   Socioeconomic History   Marital status: Married    Spouse name: Sylvia Stevens   Number of children: 2   Years of education: Not on Stevens   Highest education level: Some college, no degree  Occupational History   Occupation: retired  Tobacco Use   Smoking status: Every Day    Current packs/day: 0.50    Average packs/day: 0.5 packs/day for 30.0 years (15.0 ttl pk-yrs)    Types: Cigarettes   Smokeless tobacco: Never  Vaping Use   Vaping status: Never Used  Substance and Sexual Activity   Alcohol use: Not Currently    Comment: Occasional   Drug use: No   Sexual activity: Not Currently    Birth control/protection: Post-menopausal  Other Topics Concern   Not on Stevens  Social History Narrative   Lives with husband and adult daughter/2025   in a one story home.  Has 2 children.  Works part time as an Gaffer.  Education: some college.   Social Drivers of Corporate investment banker Strain: Low Risk  (05/26/2024)   Overall Financial Resource Strain (CARDIA)    Difficulty of Paying Living Expenses: Not hard at all  Food Insecurity: No Food Insecurity (05/26/2024)   Hunger Vital Sign    Worried About Running Out of Food in the Last Year: Never true    Ran Out of Food in the Last Year: Never true  Transportation Needs: No Transportation Needs (05/26/2024)   PRAPARE - Administrator, Civil Service (Medical): No    Lack of Transportation (Non-Medical): No  Physical Activity: Insufficiently Active (05/26/2024)   Exercise Vital Sign    Days of Exercise per Week: 4 days    Minutes of Exercise per Session: 30 min  Stress: No Stress Concern Present (05/26/2024)   [employer] of Occupational Health - Occupational Stress Questionnaire    Feeling of Stress: Only a little  Social Connections:  Moderately Integrated (05/26/2024)   Social Connection and Isolation Panel    Frequency of Communication with Friends and Family: Once a week    Frequency of Social Gatherings with Friends and Family: Once a week    Attends Religious Services: More than 4 times per year    Active Member of [employer] or Organizations: Yes    Attends Engineer, structural: More than 4 times per year    Marital Status: Married    Tobacco Counseling Ready to quit: Not Answered Counseling given: Not Answered    Clinical  Intake:  Pre-visit preparation completed: Yes  Pain : No/denies pain     BMI - recorded: 25.24 Nutritional Status: BMI 25 -29 Overweight Nutritional Risks: None Diabetes: No  No results found for: HGBA1C   How often do you need to have someone help you when you read instructions, pamphlets, or other written materials from your doctor or pharmacy?: 1 - Never  Interpreter Needed?: No  Information entered by :: Sylvia Stevens, RMA   Activities of Daily Living     05/28/2024   12:02 PM  In your present state of health, do you have any difficulty performing the following activities:  Hearing? 0  Vision? 0  Difficulty concentrating or making decisions? 0  Walking or climbing stairs? 1  Dressing or bathing? 0  Doing errands, shopping? 0  Preparing Food and eating ? N  Using the Toilet? N  In the past six months, have you accidently leaked urine? Y  Do you have problems with loss of bowel control? N  Managing your Medications? N  Managing your Finances? N  Housekeeping or managing your Housekeeping? N    Patient Care Team: Sylvia Comer BRAVO, MD as PCP - General (Family Medicine) Sylvia Ade, MD as Consulting Physician (Physical Medicine and Rehabilitation) Sylvia File, MD as Consulting Physician (Internal Medicine) Sylvia Purchase, MD as Consulting Physician (Gastroenterology)  I have updated your Care Teams any recent Medical Services you may have  received from other providers in the past year.     Assessment:   This is a routine wellness examination for [place].  Hearing/Vision screen Hearing Screening - Comments:: Denies hearing difficulties   Vision Screening - Comments:: Wears eyeglasses/Dr. Octavia   Goals Addressed             This Visit's Progress    Patient Stated   Not on track    Travel to [place] Need to renew her passport/2025       Depression Screen     06/01/2024   10:54 AM 05/30/2024    8:38 AM 11/25/2023    8:19 AM 05/28/2023    8:48 AM 04/29/2023    9:38 AM 07/10/2022   10:37 AM 05/26/2022    8:55 AM  PHQ 2/9 Scores  PHQ - 2 Score 0 1 2 0 0 0 0  PHQ- 9 Score 2 3 4  0 0 0 0    Fall Risk     05/30/2024    8:37 AM 05/28/2024   12:02 PM 11/25/2023    8:19 AM 05/28/2023    8:48 AM 04/29/2023    9:33 AM  Fall Risk   Falls in the past year? 1 1 1  0 1  Number falls in past yr: 0 0 0 0 0  Injury with Fall? 1 1 1  0 0  Risk for fall due to : History of fall(s) Impaired balance/gait  No Fall Risks   Follow up Falls evaluation completed Falls evaluation completed;Falls prevention discussed  Falls evaluation completed Falls evaluation completed;Education provided;Falls prevention discussed    MEDICARE RISK AT HOME:  Medicare Risk at Home Any stairs in or around the home?: (Patient-Rptd) No If so, are there any without handrails?: (Patient-Rptd) No Home free of loose throw rugs in walkways, pet beds, electrical cords, etc?: (Patient-Rptd) Yes Adequate lighting in your home to reduce risk of falls?: (Patient-Rptd) Yes Life alert?: (Patient-Rptd) No Use of a cane, walker or w/c?: (Patient-Rptd) No Grab bars in the bathroom?: (Patient-Rptd) Yes Shower chair or bench in shower?: (  Patient-Rptd) No Elevated toilet seat or a handicapped toilet?: (Patient-Rptd) No  TIMED UP AND GO:  Was the test performed?  No  Cognitive Function: Declined/Normal: No cognitive concerns noted by patient or family. Patient alert,  oriented, able to answer questions appropriately and recall recent events. No signs of memory loss or confusion.    07/06/2018    9:16 AM  MMSE - Mini Mental State Exam  Orientation to time 5  Orientation to Place 5  Registration 3  Attention/ Calculation 5  Recall 3  Language- name 2 objects 2  Language- repeat 1  Language- follow 3 step command 3  Language- read & follow direction 1  Write a sentence 1  Copy design 1  Total score 30        04/29/2023    9:35 AM 07/10/2022   10:38 AM  6CIT Screen  What Year? 0 points 0 points  What month? 0 points 0 points  What time? 0 points 0 points  Count back from 20 0 points 0 points  Months in reverse 0 points 0 points  Repeat phrase 2 points 0 points  Total Score 2 points 0 points    Immunizations Immunization History  Administered Date(s) Administered   Fluad Quad(high Dose 65+) 05/28/2020, 05/24/2021   Fluad Trivalent(High Dose 65+) 05/28/2023   INFLUENZA, HIGH DOSE SEASONAL PF 05/26/2022, 05/30/2024   Influenza Whole 06/10/2012, 06/14/2013   Influenza,inj,Quad PF,6+ Mos 06/05/2014, 05/11/2015   Influenza,inj,quad, With Preservative 05/19/2018   Influenza-Unspecified 05/23/2016, 05/15/2017, 05/19/2018, 04/29/2019   PFIZER Comirnaty (Gray Top)Covid-19 Tri-Sucrose Vaccine 01/07/2021   PFIZER(Purple Top)SARS-COV-2 Vaccination 10/14/2019, 11/09/2019, 07/06/2020, 06/28/2021   Pfizer Covid-19 Vaccine Bivalent Booster 26yrs & up 06/28/2021   Pfizer(Comirnaty )Fall Seasonal Vaccine 12 years and older 06/18/2022   Pneumococcal Conjugate-13 04/28/2017   Pneumococcal Polysaccharide-23 07/06/2018   Tdap 09/08/2010, 07/23/2018   Zoster, Live 06/22/2013    Screening Tests Health Maintenance  Topic Date Due   Medicare Annual Wellness (AWV)  04/28/2024   COVID-19 Vaccine (8 - 2025-26 season) 05/09/2024   Zoster Vaccines- Shingrix (1 of 2) 08/29/2024 (Originally 11/13/2001)   Mammogram  11/08/2024   DEXA SCAN  11/08/2025    DTaP/Tdap/Td (3 - Td or Tdap) 07/23/2028   Pneumococcal Vaccine: 50+ Years  Completed   Influenza Vaccine  Completed   Hepatitis C Screening  Completed   HPV VACCINES  Aged Out   Meningococcal B Vaccine  Aged Out   Colonoscopy  Discontinued    Health Maintenance Items Addressed: See Nurse Notes at the end of this note  Additional Screening:  Vision Screening: Recommended annual ophthalmology exams for early detection of glaucoma and other disorders of the eye. Is the patient up to date with their annual eye exam?  No  Who is the provider or what is the name of the office in which the patient attends annual eye exams? Dr. Octavia  Dental Screening: Recommended annual dental exams for proper oral hygiene  Community Resource Referral / Chronic Care Management: CRR required this visit?  No   CCM required this visit?  No   Plan:    I have personally reviewed and noted the following in the patient's chart:   Medical and social history Use of alcohol, tobacco or illicit drugs  Current medications and supplements including opioid prescriptions. Patient is not currently taking opioid prescriptions. Functional ability and status Nutritional status Physical activity Advanced directives List of other physicians Hospitalizations, surgeries, and ER visits in previous 12 months Vitals Screenings to include cognitive,  depression, and falls Referrals and appointments  In addition, I have reviewed and discussed with patient certain preventive protocols, quality metrics, and best practice recommendations. A written personalized care plan for preventive services as well as general preventive health recommendations were provided to patient.   Sylvia Stevens, CMA   06/01/2024   After Visit Summary: (MyChart) Due to this being a telephonic visit, the after visit summary with patients personalized plan was offered to patient via MyChart   Notes: Nothing significant to report at this time.

## 2024-06-08 ENCOUNTER — Other Ambulatory Visit: Payer: Self-pay | Admitting: Family Medicine

## 2024-06-08 DIAGNOSIS — I10 Essential (primary) hypertension: Secondary | ICD-10-CM

## 2024-07-05 ENCOUNTER — Other Ambulatory Visit: Payer: Self-pay | Admitting: Medical Genetics

## 2024-07-05 DIAGNOSIS — Z006 Encounter for examination for normal comparison and control in clinical research program: Secondary | ICD-10-CM

## 2024-07-08 ENCOUNTER — Other Ambulatory Visit: Payer: Self-pay | Admitting: Family Medicine

## 2024-07-08 ENCOUNTER — Telehealth: Payer: Self-pay | Admitting: Family Medicine

## 2024-07-08 DIAGNOSIS — E785 Hyperlipidemia, unspecified: Secondary | ICD-10-CM

## 2024-07-08 NOTE — Telephone Encounter (Signed)
 Copied from CRM [id number]. Topic: Clinical - Prescription Issue >> Jul 08, 2024 10:29 AM Alfonso ORN wrote: Reason for CRM: pt f/u on rx refill (Zetia ) that was denied at pharm. Advised discontinued by provider as of today. Please contact pt with clarification on discontinued medication.

## 2024-07-08 NOTE — Telephone Encounter (Signed)
 Pt has been notified this medication was sent in to pharmacy on file

## 2024-08-01 LAB — GENECONNECT MOLECULAR SCREEN: Genetic Analysis Overall Interpretation: NEGATIVE

## 2024-09-03 ENCOUNTER — Other Ambulatory Visit: Payer: Self-pay | Admitting: Family Medicine

## 2024-09-03 DIAGNOSIS — I10 Essential (primary) hypertension: Secondary | ICD-10-CM

## 2024-10-05 ENCOUNTER — Other Ambulatory Visit: Payer: Self-pay | Admitting: Family Medicine

## 2024-10-05 DIAGNOSIS — E785 Hyperlipidemia, unspecified: Secondary | ICD-10-CM

## 2024-11-28 ENCOUNTER — Ambulatory Visit: Admitting: Family Medicine

## 2024-11-29 ENCOUNTER — Ambulatory Visit: Admitting: Family Medicine

## 2025-01-11 ENCOUNTER — Ambulatory Visit: Admitting: Internal Medicine
